# Patient Record
Sex: Male | Born: 1975 | Race: White | Hispanic: No | Marital: Single | Smoking: Never smoker
Health system: Southern US, Community
[De-identification: ages and names within clinical notes are randomized; demographics above are authoritative.]

## PROBLEM LIST (undated history)

## (undated) DIAGNOSIS — G40909 Epilepsy, unspecified, not intractable, without status epilepticus: Secondary | ICD-10-CM

## (undated) DIAGNOSIS — Z5189 Encounter for other specified aftercare: Secondary | ICD-10-CM

## (undated) DIAGNOSIS — K5904 Chronic idiopathic constipation: Secondary | ICD-10-CM

## (undated) DIAGNOSIS — T148XXA Other injury of unspecified body region, initial encounter: Secondary | ICD-10-CM

## (undated) DIAGNOSIS — T7840XA Allergy, unspecified, initial encounter: Secondary | ICD-10-CM

## (undated) DIAGNOSIS — G808 Other cerebral palsy: Secondary | ICD-10-CM

## (undated) DIAGNOSIS — G709 Myoneural disorder, unspecified: Secondary | ICD-10-CM

## (undated) HISTORY — DX: Myoneural disorder, unspecified: G70.9

## (undated) HISTORY — PX: BOWEL RESECTION: SHX1257

## (undated) HISTORY — PX: ADENOIDECTOMY: SUR15

## (undated) HISTORY — DX: Other cerebral palsy: G80.8

## (undated) HISTORY — DX: Allergy, unspecified, initial encounter: T78.40XA

## (undated) HISTORY — DX: Other injury of unspecified body region, initial encounter: T14.8XXA

## (undated) HISTORY — PX: CHOLECYSTECTOMY: SHX55

## (undated) HISTORY — DX: Chronic idiopathic constipation: K59.04

## (undated) HISTORY — PX: OTHER SURGICAL HISTORY: SHX169

## (undated) HISTORY — DX: Encounter for other specified aftercare: Z51.89

## (undated) HISTORY — PX: TONSILLECTOMY: SUR1361

## (undated) HISTORY — DX: Epilepsy, unspecified, not intractable, without status epilepticus: G40.909

---

## 1998-12-12 ENCOUNTER — Ambulatory Visit (HOSPITAL_COMMUNITY): Admission: RE | Admit: 1998-12-12 | Discharge: 1998-12-12 | Payer: Self-pay | Admitting: Family Medicine

## 1998-12-12 ENCOUNTER — Encounter: Payer: Self-pay | Admitting: Family Medicine

## 2012-03-06 DIAGNOSIS — M542 Cervicalgia: Secondary | ICD-10-CM | POA: Diagnosis not present

## 2012-03-06 DIAGNOSIS — H6120 Impacted cerumen, unspecified ear: Secondary | ICD-10-CM | POA: Diagnosis not present

## 2012-07-18 DIAGNOSIS — R51 Headache: Secondary | ICD-10-CM | POA: Diagnosis not present

## 2012-07-18 DIAGNOSIS — G809 Cerebral palsy, unspecified: Secondary | ICD-10-CM | POA: Diagnosis not present

## 2012-07-18 DIAGNOSIS — R220 Localized swelling, mass and lump, head: Secondary | ICD-10-CM | POA: Diagnosis not present

## 2012-07-18 DIAGNOSIS — R221 Localized swelling, mass and lump, neck: Secondary | ICD-10-CM | POA: Diagnosis not present

## 2012-12-10 DIAGNOSIS — L210 Seborrhea capitis: Secondary | ICD-10-CM | POA: Diagnosis not present

## 2012-12-10 DIAGNOSIS — B360 Pityriasis versicolor: Secondary | ICD-10-CM | POA: Diagnosis not present

## 2012-12-10 DIAGNOSIS — L259 Unspecified contact dermatitis, unspecified cause: Secondary | ICD-10-CM | POA: Diagnosis not present

## 2013-09-25 DIAGNOSIS — Z23 Encounter for immunization: Secondary | ICD-10-CM | POA: Diagnosis not present

## 2013-11-27 DIAGNOSIS — J0100 Acute maxillary sinusitis, unspecified: Secondary | ICD-10-CM | POA: Diagnosis not present

## 2013-11-27 DIAGNOSIS — L218 Other seborrheic dermatitis: Secondary | ICD-10-CM | POA: Diagnosis not present

## 2013-11-27 DIAGNOSIS — G8190 Hemiplegia, unspecified affecting unspecified side: Secondary | ICD-10-CM | POA: Diagnosis not present

## 2013-11-27 DIAGNOSIS — G809 Cerebral palsy, unspecified: Secondary | ICD-10-CM | POA: Diagnosis not present

## 2014-02-10 DIAGNOSIS — J018 Other acute sinusitis: Secondary | ICD-10-CM | POA: Diagnosis not present

## 2014-02-10 DIAGNOSIS — H9209 Otalgia, unspecified ear: Secondary | ICD-10-CM | POA: Diagnosis not present

## 2014-02-10 DIAGNOSIS — R6889 Other general symptoms and signs: Secondary | ICD-10-CM | POA: Diagnosis not present

## 2014-02-10 DIAGNOSIS — H6980 Other specified disorders of Eustachian tube, unspecified ear: Secondary | ICD-10-CM | POA: Diagnosis not present

## 2014-03-09 DIAGNOSIS — Z79899 Other long term (current) drug therapy: Secondary | ICD-10-CM | POA: Diagnosis not present

## 2014-03-09 DIAGNOSIS — G809 Cerebral palsy, unspecified: Secondary | ICD-10-CM | POA: Diagnosis not present

## 2014-03-09 DIAGNOSIS — R51 Headache: Secondary | ICD-10-CM | POA: Diagnosis not present

## 2015-03-11 DIAGNOSIS — G809 Cerebral palsy, unspecified: Secondary | ICD-10-CM | POA: Diagnosis not present

## 2015-03-11 DIAGNOSIS — M6281 Muscle weakness (generalized): Secondary | ICD-10-CM | POA: Diagnosis not present

## 2015-03-11 DIAGNOSIS — G8250 Quadriplegia, unspecified: Secondary | ICD-10-CM | POA: Insufficient documentation

## 2015-03-11 DIAGNOSIS — R2681 Unsteadiness on feet: Secondary | ICD-10-CM | POA: Diagnosis not present

## 2015-03-11 DIAGNOSIS — R269 Unspecified abnormalities of gait and mobility: Secondary | ICD-10-CM | POA: Diagnosis not present

## 2015-03-11 DIAGNOSIS — M62838 Other muscle spasm: Secondary | ICD-10-CM | POA: Diagnosis not present

## 2015-03-11 DIAGNOSIS — R51 Headache: Secondary | ICD-10-CM | POA: Diagnosis not present

## 2016-09-27 DIAGNOSIS — H6122 Impacted cerumen, left ear: Secondary | ICD-10-CM | POA: Diagnosis not present

## 2016-09-27 DIAGNOSIS — Z23 Encounter for immunization: Secondary | ICD-10-CM | POA: Diagnosis not present

## 2016-09-27 DIAGNOSIS — K0500 Acute gingivitis, plaque induced: Secondary | ICD-10-CM | POA: Diagnosis not present

## 2016-09-27 DIAGNOSIS — L218 Other seborrheic dermatitis: Secondary | ICD-10-CM | POA: Diagnosis not present

## 2016-09-27 DIAGNOSIS — G802 Spastic hemiplegic cerebral palsy: Secondary | ICD-10-CM | POA: Diagnosis not present

## 2016-11-14 DIAGNOSIS — G800 Spastic quadriplegic cerebral palsy: Secondary | ICD-10-CM | POA: Diagnosis not present

## 2016-11-14 DIAGNOSIS — M6281 Muscle weakness (generalized): Secondary | ICD-10-CM | POA: Diagnosis not present

## 2016-11-14 DIAGNOSIS — Z993 Dependence on wheelchair: Secondary | ICD-10-CM | POA: Diagnosis not present

## 2016-12-17 DIAGNOSIS — H5202 Hypermetropia, left eye: Secondary | ICD-10-CM | POA: Diagnosis not present

## 2016-12-17 DIAGNOSIS — R6250 Unspecified lack of expected normal physiological development in childhood: Secondary | ICD-10-CM | POA: Insufficient documentation

## 2016-12-17 DIAGNOSIS — H52201 Unspecified astigmatism, right eye: Secondary | ICD-10-CM | POA: Diagnosis not present

## 2016-12-17 DIAGNOSIS — H5231 Anisometropia: Secondary | ICD-10-CM | POA: Diagnosis not present

## 2016-12-17 DIAGNOSIS — G8250 Quadriplegia, unspecified: Secondary | ICD-10-CM | POA: Diagnosis not present

## 2016-12-17 DIAGNOSIS — H5211 Myopia, right eye: Secondary | ICD-10-CM | POA: Diagnosis not present

## 2017-02-01 DIAGNOSIS — R269 Unspecified abnormalities of gait and mobility: Secondary | ICD-10-CM | POA: Insufficient documentation

## 2017-02-01 DIAGNOSIS — G808 Other cerebral palsy: Secondary | ICD-10-CM | POA: Diagnosis not present

## 2017-02-01 DIAGNOSIS — G8250 Quadriplegia, unspecified: Secondary | ICD-10-CM | POA: Diagnosis not present

## 2017-02-01 DIAGNOSIS — R471 Dysarthria and anarthria: Secondary | ICD-10-CM | POA: Diagnosis not present

## 2017-05-10 DIAGNOSIS — R531 Weakness: Secondary | ICD-10-CM | POA: Diagnosis not present

## 2017-05-10 DIAGNOSIS — E222 Syndrome of inappropriate secretion of antidiuretic hormone: Secondary | ICD-10-CM | POA: Diagnosis not present

## 2017-05-10 DIAGNOSIS — E860 Dehydration: Secondary | ICD-10-CM | POA: Diagnosis not present

## 2017-05-10 DIAGNOSIS — N10 Acute tubulo-interstitial nephritis: Secondary | ICD-10-CM | POA: Diagnosis not present

## 2017-05-10 DIAGNOSIS — B965 Pseudomonas (aeruginosa) (mallei) (pseudomallei) as the cause of diseases classified elsewhere: Secondary | ICD-10-CM | POA: Diagnosis not present

## 2017-05-10 DIAGNOSIS — R509 Fever, unspecified: Secondary | ICD-10-CM | POA: Diagnosis not present

## 2017-05-10 DIAGNOSIS — A419 Sepsis, unspecified organism: Secondary | ICD-10-CM | POA: Diagnosis not present

## 2017-05-10 DIAGNOSIS — Z982 Presence of cerebrospinal fluid drainage device: Secondary | ICD-10-CM | POA: Diagnosis not present

## 2017-05-10 DIAGNOSIS — G800 Spastic quadriplegic cerebral palsy: Secondary | ICD-10-CM | POA: Diagnosis not present

## 2017-05-10 DIAGNOSIS — N390 Urinary tract infection, site not specified: Secondary | ICD-10-CM | POA: Diagnosis not present

## 2017-05-10 DIAGNOSIS — R000 Tachycardia, unspecified: Secondary | ICD-10-CM | POA: Diagnosis not present

## 2017-05-11 DIAGNOSIS — K3189 Other diseases of stomach and duodenum: Secondary | ICD-10-CM | POA: Diagnosis not present

## 2017-05-11 DIAGNOSIS — N10 Acute tubulo-interstitial nephritis: Secondary | ICD-10-CM | POA: Diagnosis present

## 2017-05-11 DIAGNOSIS — R531 Weakness: Secondary | ICD-10-CM | POA: Diagnosis not present

## 2017-05-11 DIAGNOSIS — E860 Dehydration: Secondary | ICD-10-CM | POA: Diagnosis present

## 2017-05-11 DIAGNOSIS — G4709 Other insomnia: Secondary | ICD-10-CM | POA: Diagnosis present

## 2017-05-11 DIAGNOSIS — E871 Hypo-osmolality and hyponatremia: Secondary | ICD-10-CM | POA: Diagnosis not present

## 2017-05-11 DIAGNOSIS — N390 Urinary tract infection, site not specified: Secondary | ICD-10-CM | POA: Diagnosis not present

## 2017-05-11 DIAGNOSIS — K5641 Fecal impaction: Secondary | ICD-10-CM | POA: Diagnosis present

## 2017-05-11 DIAGNOSIS — A419 Sepsis, unspecified organism: Secondary | ICD-10-CM | POA: Diagnosis present

## 2017-05-11 DIAGNOSIS — D649 Anemia, unspecified: Secondary | ICD-10-CM | POA: Diagnosis present

## 2017-05-11 DIAGNOSIS — Z982 Presence of cerebrospinal fluid drainage device: Secondary | ICD-10-CM | POA: Diagnosis not present

## 2017-05-11 DIAGNOSIS — E876 Hypokalemia: Secondary | ICD-10-CM | POA: Diagnosis not present

## 2017-05-11 DIAGNOSIS — R000 Tachycardia, unspecified: Secondary | ICD-10-CM | POA: Diagnosis not present

## 2017-05-11 DIAGNOSIS — Z993 Dependence on wheelchair: Secondary | ICD-10-CM | POA: Diagnosis not present

## 2017-05-11 DIAGNOSIS — G808 Other cerebral palsy: Secondary | ICD-10-CM | POA: Diagnosis not present

## 2017-05-11 DIAGNOSIS — B965 Pseudomonas (aeruginosa) (mallei) (pseudomallei) as the cause of diseases classified elsewhere: Secondary | ICD-10-CM | POA: Diagnosis present

## 2017-05-11 DIAGNOSIS — R791 Abnormal coagulation profile: Secondary | ICD-10-CM | POA: Diagnosis present

## 2017-05-11 DIAGNOSIS — E222 Syndrome of inappropriate secretion of antidiuretic hormone: Secondary | ICD-10-CM | POA: Diagnosis present

## 2017-05-11 DIAGNOSIS — R509 Fever, unspecified: Secondary | ICD-10-CM | POA: Diagnosis not present

## 2017-05-11 DIAGNOSIS — N3000 Acute cystitis without hematuria: Secondary | ICD-10-CM | POA: Diagnosis not present

## 2017-05-11 DIAGNOSIS — Z9189 Other specified personal risk factors, not elsewhere classified: Secondary | ICD-10-CM | POA: Diagnosis not present

## 2017-05-11 DIAGNOSIS — R6250 Unspecified lack of expected normal physiological development in childhood: Secondary | ICD-10-CM | POA: Diagnosis present

## 2017-05-11 DIAGNOSIS — G800 Spastic quadriplegic cerebral palsy: Secondary | ICD-10-CM | POA: Diagnosis present

## 2017-05-21 DIAGNOSIS — A419 Sepsis, unspecified organism: Secondary | ICD-10-CM | POA: Diagnosis not present

## 2017-05-21 DIAGNOSIS — N10 Acute tubulo-interstitial nephritis: Secondary | ICD-10-CM | POA: Diagnosis not present

## 2017-05-21 DIAGNOSIS — A4152 Sepsis due to Pseudomonas: Secondary | ICD-10-CM | POA: Diagnosis not present

## 2017-05-21 DIAGNOSIS — E871 Hypo-osmolality and hyponatremia: Secondary | ICD-10-CM | POA: Diagnosis not present

## 2017-05-21 DIAGNOSIS — G800 Spastic quadriplegic cerebral palsy: Secondary | ICD-10-CM | POA: Diagnosis not present

## 2017-05-27 DIAGNOSIS — G800 Spastic quadriplegic cerebral palsy: Secondary | ICD-10-CM | POA: Diagnosis not present

## 2017-05-27 DIAGNOSIS — G40909 Epilepsy, unspecified, not intractable, without status epilepticus: Secondary | ICD-10-CM | POA: Diagnosis not present

## 2017-05-27 DIAGNOSIS — Z982 Presence of cerebrospinal fluid drainage device: Secondary | ICD-10-CM | POA: Diagnosis not present

## 2017-05-27 DIAGNOSIS — M24521 Contracture, right elbow: Secondary | ICD-10-CM | POA: Diagnosis not present

## 2017-05-27 DIAGNOSIS — Z87440 Personal history of urinary (tract) infections: Secondary | ICD-10-CM | POA: Diagnosis not present

## 2017-05-27 DIAGNOSIS — M6281 Muscle weakness (generalized): Secondary | ICD-10-CM | POA: Diagnosis not present

## 2017-05-30 DIAGNOSIS — Z982 Presence of cerebrospinal fluid drainage device: Secondary | ICD-10-CM | POA: Diagnosis not present

## 2017-05-30 DIAGNOSIS — G40909 Epilepsy, unspecified, not intractable, without status epilepticus: Secondary | ICD-10-CM | POA: Diagnosis not present

## 2017-05-30 DIAGNOSIS — G800 Spastic quadriplegic cerebral palsy: Secondary | ICD-10-CM | POA: Diagnosis not present

## 2017-05-30 DIAGNOSIS — M6281 Muscle weakness (generalized): Secondary | ICD-10-CM | POA: Diagnosis not present

## 2017-05-30 DIAGNOSIS — Z87440 Personal history of urinary (tract) infections: Secondary | ICD-10-CM | POA: Diagnosis not present

## 2017-05-30 DIAGNOSIS — M24521 Contracture, right elbow: Secondary | ICD-10-CM | POA: Diagnosis not present

## 2017-06-04 DIAGNOSIS — Z87440 Personal history of urinary (tract) infections: Secondary | ICD-10-CM | POA: Diagnosis not present

## 2017-06-04 DIAGNOSIS — M6281 Muscle weakness (generalized): Secondary | ICD-10-CM | POA: Diagnosis not present

## 2017-06-04 DIAGNOSIS — G800 Spastic quadriplegic cerebral palsy: Secondary | ICD-10-CM | POA: Diagnosis not present

## 2017-06-04 DIAGNOSIS — G40909 Epilepsy, unspecified, not intractable, without status epilepticus: Secondary | ICD-10-CM | POA: Diagnosis not present

## 2017-06-04 DIAGNOSIS — M24521 Contracture, right elbow: Secondary | ICD-10-CM | POA: Diagnosis not present

## 2017-06-04 DIAGNOSIS — Z982 Presence of cerebrospinal fluid drainage device: Secondary | ICD-10-CM | POA: Diagnosis not present

## 2017-06-04 DIAGNOSIS — D649 Anemia, unspecified: Secondary | ICD-10-CM | POA: Diagnosis not present

## 2017-06-05 DIAGNOSIS — Z23 Encounter for immunization: Secondary | ICD-10-CM | POA: Diagnosis not present

## 2017-06-05 DIAGNOSIS — R358 Other polyuria: Secondary | ICD-10-CM | POA: Diagnosis not present

## 2017-06-05 DIAGNOSIS — D649 Anemia, unspecified: Secondary | ICD-10-CM | POA: Diagnosis not present

## 2017-06-05 DIAGNOSIS — D538 Other specified nutritional anemias: Secondary | ICD-10-CM | POA: Diagnosis not present

## 2017-06-05 DIAGNOSIS — G800 Spastic quadriplegic cerebral palsy: Secondary | ICD-10-CM | POA: Diagnosis not present

## 2017-06-06 DIAGNOSIS — G800 Spastic quadriplegic cerebral palsy: Secondary | ICD-10-CM | POA: Diagnosis not present

## 2017-06-06 DIAGNOSIS — Z982 Presence of cerebrospinal fluid drainage device: Secondary | ICD-10-CM | POA: Diagnosis not present

## 2017-06-06 DIAGNOSIS — M6281 Muscle weakness (generalized): Secondary | ICD-10-CM | POA: Diagnosis not present

## 2017-06-06 DIAGNOSIS — Z87440 Personal history of urinary (tract) infections: Secondary | ICD-10-CM | POA: Diagnosis not present

## 2017-06-06 DIAGNOSIS — M24521 Contracture, right elbow: Secondary | ICD-10-CM | POA: Diagnosis not present

## 2017-06-06 DIAGNOSIS — G40909 Epilepsy, unspecified, not intractable, without status epilepticus: Secondary | ICD-10-CM | POA: Diagnosis not present

## 2017-06-10 DIAGNOSIS — G800 Spastic quadriplegic cerebral palsy: Secondary | ICD-10-CM | POA: Diagnosis not present

## 2017-06-10 DIAGNOSIS — G40909 Epilepsy, unspecified, not intractable, without status epilepticus: Secondary | ICD-10-CM | POA: Diagnosis not present

## 2017-06-10 DIAGNOSIS — M24521 Contracture, right elbow: Secondary | ICD-10-CM | POA: Diagnosis not present

## 2017-06-10 DIAGNOSIS — Z982 Presence of cerebrospinal fluid drainage device: Secondary | ICD-10-CM | POA: Diagnosis not present

## 2017-06-10 DIAGNOSIS — M6281 Muscle weakness (generalized): Secondary | ICD-10-CM | POA: Diagnosis not present

## 2017-06-10 DIAGNOSIS — Z87440 Personal history of urinary (tract) infections: Secondary | ICD-10-CM | POA: Diagnosis not present

## 2017-06-11 DIAGNOSIS — G800 Spastic quadriplegic cerebral palsy: Secondary | ICD-10-CM | POA: Diagnosis not present

## 2017-06-11 DIAGNOSIS — M6281 Muscle weakness (generalized): Secondary | ICD-10-CM | POA: Diagnosis not present

## 2017-06-11 DIAGNOSIS — Z87440 Personal history of urinary (tract) infections: Secondary | ICD-10-CM | POA: Diagnosis not present

## 2017-06-11 DIAGNOSIS — G40909 Epilepsy, unspecified, not intractable, without status epilepticus: Secondary | ICD-10-CM | POA: Diagnosis not present

## 2017-06-11 DIAGNOSIS — M24521 Contracture, right elbow: Secondary | ICD-10-CM | POA: Diagnosis not present

## 2017-06-11 DIAGNOSIS — Z982 Presence of cerebrospinal fluid drainage device: Secondary | ICD-10-CM | POA: Diagnosis not present

## 2017-06-12 DIAGNOSIS — M24521 Contracture, right elbow: Secondary | ICD-10-CM | POA: Diagnosis not present

## 2017-06-12 DIAGNOSIS — Z87440 Personal history of urinary (tract) infections: Secondary | ICD-10-CM | POA: Diagnosis not present

## 2017-06-12 DIAGNOSIS — G40909 Epilepsy, unspecified, not intractable, without status epilepticus: Secondary | ICD-10-CM | POA: Diagnosis not present

## 2017-06-12 DIAGNOSIS — M6281 Muscle weakness (generalized): Secondary | ICD-10-CM | POA: Diagnosis not present

## 2017-06-12 DIAGNOSIS — G800 Spastic quadriplegic cerebral palsy: Secondary | ICD-10-CM | POA: Diagnosis not present

## 2017-06-12 DIAGNOSIS — Z982 Presence of cerebrospinal fluid drainage device: Secondary | ICD-10-CM | POA: Diagnosis not present

## 2017-06-13 DIAGNOSIS — Z87440 Personal history of urinary (tract) infections: Secondary | ICD-10-CM | POA: Diagnosis not present

## 2017-06-13 DIAGNOSIS — M24521 Contracture, right elbow: Secondary | ICD-10-CM | POA: Diagnosis not present

## 2017-06-13 DIAGNOSIS — M6281 Muscle weakness (generalized): Secondary | ICD-10-CM | POA: Diagnosis not present

## 2017-06-13 DIAGNOSIS — G40909 Epilepsy, unspecified, not intractable, without status epilepticus: Secondary | ICD-10-CM | POA: Diagnosis not present

## 2017-06-13 DIAGNOSIS — G800 Spastic quadriplegic cerebral palsy: Secondary | ICD-10-CM | POA: Diagnosis not present

## 2017-06-13 DIAGNOSIS — Z982 Presence of cerebrospinal fluid drainage device: Secondary | ICD-10-CM | POA: Diagnosis not present

## 2017-06-17 DIAGNOSIS — M24521 Contracture, right elbow: Secondary | ICD-10-CM | POA: Diagnosis not present

## 2017-06-17 DIAGNOSIS — G40909 Epilepsy, unspecified, not intractable, without status epilepticus: Secondary | ICD-10-CM | POA: Diagnosis not present

## 2017-06-17 DIAGNOSIS — Z982 Presence of cerebrospinal fluid drainage device: Secondary | ICD-10-CM | POA: Diagnosis not present

## 2017-06-17 DIAGNOSIS — Z87440 Personal history of urinary (tract) infections: Secondary | ICD-10-CM | POA: Diagnosis not present

## 2017-06-17 DIAGNOSIS — G800 Spastic quadriplegic cerebral palsy: Secondary | ICD-10-CM | POA: Diagnosis not present

## 2017-06-17 DIAGNOSIS — M6281 Muscle weakness (generalized): Secondary | ICD-10-CM | POA: Diagnosis not present

## 2017-06-19 DIAGNOSIS — Z982 Presence of cerebrospinal fluid drainage device: Secondary | ICD-10-CM | POA: Diagnosis not present

## 2017-06-19 DIAGNOSIS — G800 Spastic quadriplegic cerebral palsy: Secondary | ICD-10-CM | POA: Diagnosis not present

## 2017-06-19 DIAGNOSIS — Z87440 Personal history of urinary (tract) infections: Secondary | ICD-10-CM | POA: Diagnosis not present

## 2017-06-19 DIAGNOSIS — G40909 Epilepsy, unspecified, not intractable, without status epilepticus: Secondary | ICD-10-CM | POA: Diagnosis not present

## 2017-06-19 DIAGNOSIS — M6281 Muscle weakness (generalized): Secondary | ICD-10-CM | POA: Diagnosis not present

## 2017-06-19 DIAGNOSIS — M24521 Contracture, right elbow: Secondary | ICD-10-CM | POA: Diagnosis not present

## 2017-06-24 DIAGNOSIS — G40909 Epilepsy, unspecified, not intractable, without status epilepticus: Secondary | ICD-10-CM | POA: Diagnosis not present

## 2017-06-24 DIAGNOSIS — M24521 Contracture, right elbow: Secondary | ICD-10-CM | POA: Diagnosis not present

## 2017-06-24 DIAGNOSIS — Z982 Presence of cerebrospinal fluid drainage device: Secondary | ICD-10-CM | POA: Diagnosis not present

## 2017-06-24 DIAGNOSIS — Z87440 Personal history of urinary (tract) infections: Secondary | ICD-10-CM | POA: Diagnosis not present

## 2017-06-24 DIAGNOSIS — M6281 Muscle weakness (generalized): Secondary | ICD-10-CM | POA: Diagnosis not present

## 2017-06-24 DIAGNOSIS — G800 Spastic quadriplegic cerebral palsy: Secondary | ICD-10-CM | POA: Diagnosis not present

## 2017-06-25 DIAGNOSIS — G40909 Epilepsy, unspecified, not intractable, without status epilepticus: Secondary | ICD-10-CM | POA: Diagnosis not present

## 2017-06-25 DIAGNOSIS — G800 Spastic quadriplegic cerebral palsy: Secondary | ICD-10-CM | POA: Diagnosis not present

## 2017-06-25 DIAGNOSIS — M24521 Contracture, right elbow: Secondary | ICD-10-CM | POA: Diagnosis not present

## 2017-06-25 DIAGNOSIS — Z982 Presence of cerebrospinal fluid drainage device: Secondary | ICD-10-CM | POA: Diagnosis not present

## 2017-06-25 DIAGNOSIS — M6281 Muscle weakness (generalized): Secondary | ICD-10-CM | POA: Diagnosis not present

## 2017-06-25 DIAGNOSIS — Z87440 Personal history of urinary (tract) infections: Secondary | ICD-10-CM | POA: Diagnosis not present

## 2017-06-28 DIAGNOSIS — Z87440 Personal history of urinary (tract) infections: Secondary | ICD-10-CM | POA: Diagnosis not present

## 2017-06-28 DIAGNOSIS — G800 Spastic quadriplegic cerebral palsy: Secondary | ICD-10-CM | POA: Diagnosis not present

## 2017-06-28 DIAGNOSIS — M24521 Contracture, right elbow: Secondary | ICD-10-CM | POA: Diagnosis not present

## 2017-06-28 DIAGNOSIS — Z982 Presence of cerebrospinal fluid drainage device: Secondary | ICD-10-CM | POA: Diagnosis not present

## 2017-06-28 DIAGNOSIS — G40909 Epilepsy, unspecified, not intractable, without status epilepticus: Secondary | ICD-10-CM | POA: Diagnosis not present

## 2017-06-28 DIAGNOSIS — M6281 Muscle weakness (generalized): Secondary | ICD-10-CM | POA: Diagnosis not present

## 2017-07-02 DIAGNOSIS — M6281 Muscle weakness (generalized): Secondary | ICD-10-CM | POA: Diagnosis not present

## 2017-07-02 DIAGNOSIS — Z87440 Personal history of urinary (tract) infections: Secondary | ICD-10-CM | POA: Diagnosis not present

## 2017-07-02 DIAGNOSIS — M24521 Contracture, right elbow: Secondary | ICD-10-CM | POA: Diagnosis not present

## 2017-07-02 DIAGNOSIS — Z982 Presence of cerebrospinal fluid drainage device: Secondary | ICD-10-CM | POA: Diagnosis not present

## 2017-07-02 DIAGNOSIS — G800 Spastic quadriplegic cerebral palsy: Secondary | ICD-10-CM | POA: Diagnosis not present

## 2017-07-02 DIAGNOSIS — G40909 Epilepsy, unspecified, not intractable, without status epilepticus: Secondary | ICD-10-CM | POA: Diagnosis not present

## 2017-07-05 DIAGNOSIS — R358 Other polyuria: Secondary | ICD-10-CM | POA: Diagnosis not present

## 2017-07-05 DIAGNOSIS — G800 Spastic quadriplegic cerebral palsy: Secondary | ICD-10-CM | POA: Diagnosis not present

## 2017-07-09 DIAGNOSIS — Z982 Presence of cerebrospinal fluid drainage device: Secondary | ICD-10-CM | POA: Diagnosis not present

## 2017-07-09 DIAGNOSIS — M6281 Muscle weakness (generalized): Secondary | ICD-10-CM | POA: Diagnosis not present

## 2017-07-09 DIAGNOSIS — Z87440 Personal history of urinary (tract) infections: Secondary | ICD-10-CM | POA: Diagnosis not present

## 2017-07-09 DIAGNOSIS — G40909 Epilepsy, unspecified, not intractable, without status epilepticus: Secondary | ICD-10-CM | POA: Diagnosis not present

## 2017-07-09 DIAGNOSIS — G800 Spastic quadriplegic cerebral palsy: Secondary | ICD-10-CM | POA: Diagnosis not present

## 2017-07-09 DIAGNOSIS — M24521 Contracture, right elbow: Secondary | ICD-10-CM | POA: Diagnosis not present

## 2017-11-12 DIAGNOSIS — R471 Dysarthria and anarthria: Secondary | ICD-10-CM | POA: Diagnosis not present

## 2017-11-12 DIAGNOSIS — G808 Other cerebral palsy: Secondary | ICD-10-CM | POA: Diagnosis not present

## 2017-11-12 DIAGNOSIS — R269 Unspecified abnormalities of gait and mobility: Secondary | ICD-10-CM | POA: Diagnosis not present

## 2017-11-12 DIAGNOSIS — G8250 Quadriplegia, unspecified: Secondary | ICD-10-CM | POA: Diagnosis not present

## 2017-11-12 DIAGNOSIS — Z741 Need for assistance with personal care: Secondary | ICD-10-CM | POA: Diagnosis not present

## 2017-12-05 DIAGNOSIS — Z9181 History of falling: Secondary | ICD-10-CM | POA: Diagnosis not present

## 2017-12-05 DIAGNOSIS — G800 Spastic quadriplegic cerebral palsy: Secondary | ICD-10-CM | POA: Diagnosis not present

## 2017-12-05 DIAGNOSIS — Z993 Dependence on wheelchair: Secondary | ICD-10-CM | POA: Diagnosis not present

## 2017-12-05 DIAGNOSIS — G919 Hydrocephalus, unspecified: Secondary | ICD-10-CM | POA: Diagnosis not present

## 2017-12-17 DIAGNOSIS — Z9181 History of falling: Secondary | ICD-10-CM | POA: Diagnosis not present

## 2017-12-17 DIAGNOSIS — G919 Hydrocephalus, unspecified: Secondary | ICD-10-CM | POA: Diagnosis not present

## 2017-12-17 DIAGNOSIS — Z993 Dependence on wheelchair: Secondary | ICD-10-CM | POA: Diagnosis not present

## 2017-12-17 DIAGNOSIS — G800 Spastic quadriplegic cerebral palsy: Secondary | ICD-10-CM | POA: Diagnosis not present

## 2017-12-18 DIAGNOSIS — Z993 Dependence on wheelchair: Secondary | ICD-10-CM | POA: Diagnosis not present

## 2017-12-18 DIAGNOSIS — G919 Hydrocephalus, unspecified: Secondary | ICD-10-CM | POA: Diagnosis not present

## 2017-12-18 DIAGNOSIS — G800 Spastic quadriplegic cerebral palsy: Secondary | ICD-10-CM | POA: Diagnosis not present

## 2017-12-18 DIAGNOSIS — Z9181 History of falling: Secondary | ICD-10-CM | POA: Diagnosis not present

## 2017-12-26 DIAGNOSIS — G919 Hydrocephalus, unspecified: Secondary | ICD-10-CM | POA: Diagnosis not present

## 2017-12-26 DIAGNOSIS — Z993 Dependence on wheelchair: Secondary | ICD-10-CM | POA: Diagnosis not present

## 2017-12-26 DIAGNOSIS — Z9181 History of falling: Secondary | ICD-10-CM | POA: Diagnosis not present

## 2017-12-26 DIAGNOSIS — G800 Spastic quadriplegic cerebral palsy: Secondary | ICD-10-CM | POA: Diagnosis not present

## 2017-12-31 DIAGNOSIS — Z993 Dependence on wheelchair: Secondary | ICD-10-CM | POA: Diagnosis not present

## 2017-12-31 DIAGNOSIS — G919 Hydrocephalus, unspecified: Secondary | ICD-10-CM | POA: Diagnosis not present

## 2017-12-31 DIAGNOSIS — G800 Spastic quadriplegic cerebral palsy: Secondary | ICD-10-CM | POA: Diagnosis not present

## 2017-12-31 DIAGNOSIS — Z9181 History of falling: Secondary | ICD-10-CM | POA: Diagnosis not present

## 2018-01-01 DIAGNOSIS — E441 Mild protein-calorie malnutrition: Secondary | ICD-10-CM | POA: Diagnosis not present

## 2018-01-01 DIAGNOSIS — Z13220 Encounter for screening for lipoid disorders: Secondary | ICD-10-CM | POA: Diagnosis not present

## 2018-01-01 DIAGNOSIS — G800 Spastic quadriplegic cerebral palsy: Secondary | ICD-10-CM | POA: Diagnosis not present

## 2018-01-01 DIAGNOSIS — Z23 Encounter for immunization: Secondary | ICD-10-CM | POA: Diagnosis not present

## 2018-01-01 DIAGNOSIS — Z0001 Encounter for general adult medical examination with abnormal findings: Secondary | ICD-10-CM | POA: Diagnosis not present

## 2018-01-03 DIAGNOSIS — G919 Hydrocephalus, unspecified: Secondary | ICD-10-CM | POA: Diagnosis not present

## 2018-01-03 DIAGNOSIS — Z993 Dependence on wheelchair: Secondary | ICD-10-CM | POA: Diagnosis not present

## 2018-01-03 DIAGNOSIS — Z9181 History of falling: Secondary | ICD-10-CM | POA: Diagnosis not present

## 2018-01-03 DIAGNOSIS — G800 Spastic quadriplegic cerebral palsy: Secondary | ICD-10-CM | POA: Diagnosis not present

## 2018-01-07 DIAGNOSIS — Z993 Dependence on wheelchair: Secondary | ICD-10-CM | POA: Diagnosis not present

## 2018-01-07 DIAGNOSIS — Z9181 History of falling: Secondary | ICD-10-CM | POA: Diagnosis not present

## 2018-01-07 DIAGNOSIS — G919 Hydrocephalus, unspecified: Secondary | ICD-10-CM | POA: Diagnosis not present

## 2018-01-07 DIAGNOSIS — G800 Spastic quadriplegic cerebral palsy: Secondary | ICD-10-CM | POA: Diagnosis not present

## 2018-01-08 DIAGNOSIS — Z9181 History of falling: Secondary | ICD-10-CM | POA: Diagnosis not present

## 2018-01-08 DIAGNOSIS — G919 Hydrocephalus, unspecified: Secondary | ICD-10-CM | POA: Diagnosis not present

## 2018-01-08 DIAGNOSIS — Z993 Dependence on wheelchair: Secondary | ICD-10-CM | POA: Diagnosis not present

## 2018-01-08 DIAGNOSIS — G800 Spastic quadriplegic cerebral palsy: Secondary | ICD-10-CM | POA: Diagnosis not present

## 2018-01-09 DIAGNOSIS — Z9181 History of falling: Secondary | ICD-10-CM | POA: Diagnosis not present

## 2018-01-09 DIAGNOSIS — G800 Spastic quadriplegic cerebral palsy: Secondary | ICD-10-CM | POA: Diagnosis not present

## 2018-01-09 DIAGNOSIS — Z993 Dependence on wheelchair: Secondary | ICD-10-CM | POA: Diagnosis not present

## 2018-01-09 DIAGNOSIS — G919 Hydrocephalus, unspecified: Secondary | ICD-10-CM | POA: Diagnosis not present

## 2018-01-14 DIAGNOSIS — G800 Spastic quadriplegic cerebral palsy: Secondary | ICD-10-CM | POA: Diagnosis not present

## 2018-01-14 DIAGNOSIS — Z993 Dependence on wheelchair: Secondary | ICD-10-CM | POA: Diagnosis not present

## 2018-01-14 DIAGNOSIS — Z9181 History of falling: Secondary | ICD-10-CM | POA: Diagnosis not present

## 2018-01-14 DIAGNOSIS — G919 Hydrocephalus, unspecified: Secondary | ICD-10-CM | POA: Diagnosis not present

## 2018-01-15 DIAGNOSIS — H5789 Other specified disorders of eye and adnexa: Secondary | ICD-10-CM | POA: Diagnosis not present

## 2018-01-15 DIAGNOSIS — G8250 Quadriplegia, unspecified: Secondary | ICD-10-CM | POA: Diagnosis not present

## 2018-01-15 DIAGNOSIS — G809 Cerebral palsy, unspecified: Secondary | ICD-10-CM | POA: Diagnosis not present

## 2018-01-15 DIAGNOSIS — H5231 Anisometropia: Secondary | ICD-10-CM | POA: Diagnosis not present

## 2018-01-16 DIAGNOSIS — G800 Spastic quadriplegic cerebral palsy: Secondary | ICD-10-CM | POA: Diagnosis not present

## 2018-01-16 DIAGNOSIS — Z993 Dependence on wheelchair: Secondary | ICD-10-CM | POA: Diagnosis not present

## 2018-01-16 DIAGNOSIS — Z9181 History of falling: Secondary | ICD-10-CM | POA: Diagnosis not present

## 2018-01-16 DIAGNOSIS — G919 Hydrocephalus, unspecified: Secondary | ICD-10-CM | POA: Diagnosis not present

## 2018-01-21 DIAGNOSIS — Z993 Dependence on wheelchair: Secondary | ICD-10-CM | POA: Diagnosis not present

## 2018-01-21 DIAGNOSIS — G919 Hydrocephalus, unspecified: Secondary | ICD-10-CM | POA: Diagnosis not present

## 2018-01-21 DIAGNOSIS — Z9181 History of falling: Secondary | ICD-10-CM | POA: Diagnosis not present

## 2018-01-21 DIAGNOSIS — G800 Spastic quadriplegic cerebral palsy: Secondary | ICD-10-CM | POA: Diagnosis not present

## 2018-01-23 DIAGNOSIS — G919 Hydrocephalus, unspecified: Secondary | ICD-10-CM | POA: Diagnosis not present

## 2018-01-23 DIAGNOSIS — G800 Spastic quadriplegic cerebral palsy: Secondary | ICD-10-CM | POA: Diagnosis not present

## 2018-01-23 DIAGNOSIS — Z9181 History of falling: Secondary | ICD-10-CM | POA: Diagnosis not present

## 2018-01-23 DIAGNOSIS — Z993 Dependence on wheelchair: Secondary | ICD-10-CM | POA: Diagnosis not present

## 2018-01-28 DIAGNOSIS — G919 Hydrocephalus, unspecified: Secondary | ICD-10-CM | POA: Diagnosis not present

## 2018-01-28 DIAGNOSIS — Z9181 History of falling: Secondary | ICD-10-CM | POA: Diagnosis not present

## 2018-01-28 DIAGNOSIS — Z993 Dependence on wheelchair: Secondary | ICD-10-CM | POA: Diagnosis not present

## 2018-01-28 DIAGNOSIS — G800 Spastic quadriplegic cerebral palsy: Secondary | ICD-10-CM | POA: Diagnosis not present

## 2018-03-04 DIAGNOSIS — G8250 Quadriplegia, unspecified: Secondary | ICD-10-CM | POA: Diagnosis not present

## 2018-03-04 DIAGNOSIS — G800 Spastic quadriplegic cerebral palsy: Secondary | ICD-10-CM | POA: Diagnosis not present

## 2018-07-07 DIAGNOSIS — R471 Dysarthria and anarthria: Secondary | ICD-10-CM | POA: Diagnosis not present

## 2018-07-07 DIAGNOSIS — R351 Nocturia: Secondary | ICD-10-CM | POA: Diagnosis not present

## 2018-07-07 DIAGNOSIS — G8250 Quadriplegia, unspecified: Secondary | ICD-10-CM | POA: Diagnosis not present

## 2018-07-07 DIAGNOSIS — R269 Unspecified abnormalities of gait and mobility: Secondary | ICD-10-CM | POA: Diagnosis not present

## 2018-07-14 DIAGNOSIS — L89302 Pressure ulcer of unspecified buttock, stage 2: Secondary | ICD-10-CM | POA: Diagnosis not present

## 2018-07-22 DIAGNOSIS — Z23 Encounter for immunization: Secondary | ICD-10-CM | POA: Diagnosis not present

## 2018-07-22 DIAGNOSIS — L89302 Pressure ulcer of unspecified buttock, stage 2: Secondary | ICD-10-CM | POA: Diagnosis not present

## 2018-07-23 DIAGNOSIS — E441 Mild protein-calorie malnutrition: Secondary | ICD-10-CM | POA: Diagnosis not present

## 2018-07-23 DIAGNOSIS — G800 Spastic quadriplegic cerebral palsy: Secondary | ICD-10-CM | POA: Diagnosis not present

## 2018-07-23 DIAGNOSIS — Z87440 Personal history of urinary (tract) infections: Secondary | ICD-10-CM | POA: Diagnosis not present

## 2018-07-23 DIAGNOSIS — K5904 Chronic idiopathic constipation: Secondary | ICD-10-CM | POA: Diagnosis not present

## 2018-07-23 DIAGNOSIS — L89322 Pressure ulcer of left buttock, stage 2: Secondary | ICD-10-CM | POA: Diagnosis not present

## 2018-07-23 DIAGNOSIS — M24521 Contracture, right elbow: Secondary | ICD-10-CM | POA: Diagnosis not present

## 2018-07-23 DIAGNOSIS — Z982 Presence of cerebrospinal fluid drainage device: Secondary | ICD-10-CM | POA: Diagnosis not present

## 2018-07-23 DIAGNOSIS — G40909 Epilepsy, unspecified, not intractable, without status epilepticus: Secondary | ICD-10-CM | POA: Diagnosis not present

## 2018-07-30 DIAGNOSIS — G800 Spastic quadriplegic cerebral palsy: Secondary | ICD-10-CM | POA: Diagnosis not present

## 2018-07-30 DIAGNOSIS — M24521 Contracture, right elbow: Secondary | ICD-10-CM | POA: Diagnosis not present

## 2018-07-30 DIAGNOSIS — G40909 Epilepsy, unspecified, not intractable, without status epilepticus: Secondary | ICD-10-CM | POA: Diagnosis not present

## 2018-07-30 DIAGNOSIS — L89322 Pressure ulcer of left buttock, stage 2: Secondary | ICD-10-CM | POA: Diagnosis not present

## 2018-07-31 DIAGNOSIS — K5904 Chronic idiopathic constipation: Secondary | ICD-10-CM | POA: Diagnosis not present

## 2018-07-31 DIAGNOSIS — G40909 Epilepsy, unspecified, not intractable, without status epilepticus: Secondary | ICD-10-CM | POA: Diagnosis not present

## 2018-07-31 DIAGNOSIS — G800 Spastic quadriplegic cerebral palsy: Secondary | ICD-10-CM | POA: Diagnosis not present

## 2018-07-31 DIAGNOSIS — M24521 Contracture, right elbow: Secondary | ICD-10-CM | POA: Diagnosis not present

## 2018-07-31 DIAGNOSIS — E441 Mild protein-calorie malnutrition: Secondary | ICD-10-CM | POA: Diagnosis not present

## 2018-07-31 DIAGNOSIS — L89322 Pressure ulcer of left buttock, stage 2: Secondary | ICD-10-CM | POA: Diagnosis not present

## 2018-08-05 DIAGNOSIS — M24521 Contracture, right elbow: Secondary | ICD-10-CM | POA: Diagnosis not present

## 2018-08-05 DIAGNOSIS — K5904 Chronic idiopathic constipation: Secondary | ICD-10-CM | POA: Diagnosis not present

## 2018-08-05 DIAGNOSIS — L89322 Pressure ulcer of left buttock, stage 2: Secondary | ICD-10-CM | POA: Diagnosis not present

## 2018-08-05 DIAGNOSIS — E441 Mild protein-calorie malnutrition: Secondary | ICD-10-CM | POA: Diagnosis not present

## 2018-08-05 DIAGNOSIS — G800 Spastic quadriplegic cerebral palsy: Secondary | ICD-10-CM | POA: Diagnosis not present

## 2018-08-05 DIAGNOSIS — G40909 Epilepsy, unspecified, not intractable, without status epilepticus: Secondary | ICD-10-CM | POA: Diagnosis not present

## 2018-08-12 DIAGNOSIS — M24521 Contracture, right elbow: Secondary | ICD-10-CM | POA: Diagnosis not present

## 2018-08-12 DIAGNOSIS — L89322 Pressure ulcer of left buttock, stage 2: Secondary | ICD-10-CM | POA: Diagnosis not present

## 2018-08-12 DIAGNOSIS — G800 Spastic quadriplegic cerebral palsy: Secondary | ICD-10-CM | POA: Diagnosis not present

## 2018-08-12 DIAGNOSIS — K5904 Chronic idiopathic constipation: Secondary | ICD-10-CM | POA: Diagnosis not present

## 2018-08-12 DIAGNOSIS — E441 Mild protein-calorie malnutrition: Secondary | ICD-10-CM | POA: Diagnosis not present

## 2018-08-12 DIAGNOSIS — G40909 Epilepsy, unspecified, not intractable, without status epilepticus: Secondary | ICD-10-CM | POA: Diagnosis not present

## 2018-08-19 DIAGNOSIS — L89322 Pressure ulcer of left buttock, stage 2: Secondary | ICD-10-CM | POA: Diagnosis not present

## 2018-08-19 DIAGNOSIS — E441 Mild protein-calorie malnutrition: Secondary | ICD-10-CM | POA: Diagnosis not present

## 2018-08-19 DIAGNOSIS — K5904 Chronic idiopathic constipation: Secondary | ICD-10-CM | POA: Diagnosis not present

## 2018-08-19 DIAGNOSIS — M24521 Contracture, right elbow: Secondary | ICD-10-CM | POA: Diagnosis not present

## 2018-08-19 DIAGNOSIS — G800 Spastic quadriplegic cerebral palsy: Secondary | ICD-10-CM | POA: Diagnosis not present

## 2018-08-19 DIAGNOSIS — G40909 Epilepsy, unspecified, not intractable, without status epilepticus: Secondary | ICD-10-CM | POA: Diagnosis not present

## 2018-10-16 DIAGNOSIS — H6123 Impacted cerumen, bilateral: Secondary | ICD-10-CM | POA: Diagnosis not present

## 2019-05-18 DIAGNOSIS — K5904 Chronic idiopathic constipation: Secondary | ICD-10-CM | POA: Diagnosis not present

## 2019-05-18 DIAGNOSIS — E441 Mild protein-calorie malnutrition: Secondary | ICD-10-CM | POA: Diagnosis not present

## 2019-05-18 DIAGNOSIS — G800 Spastic quadriplegic cerebral palsy: Secondary | ICD-10-CM | POA: Diagnosis not present

## 2019-06-15 DIAGNOSIS — N3001 Acute cystitis with hematuria: Secondary | ICD-10-CM | POA: Diagnosis not present

## 2019-06-15 DIAGNOSIS — N3000 Acute cystitis without hematuria: Secondary | ICD-10-CM | POA: Diagnosis not present

## 2019-07-10 DIAGNOSIS — Z23 Encounter for immunization: Secondary | ICD-10-CM | POA: Diagnosis not present

## 2019-08-04 DIAGNOSIS — N3000 Acute cystitis without hematuria: Secondary | ICD-10-CM | POA: Diagnosis not present

## 2019-12-08 DIAGNOSIS — G808 Other cerebral palsy: Secondary | ICD-10-CM | POA: Diagnosis not present

## 2019-12-08 DIAGNOSIS — R269 Unspecified abnormalities of gait and mobility: Secondary | ICD-10-CM | POA: Diagnosis not present

## 2019-12-08 DIAGNOSIS — R6250 Unspecified lack of expected normal physiological development in childhood: Secondary | ICD-10-CM | POA: Diagnosis not present

## 2019-12-17 DIAGNOSIS — Z23 Encounter for immunization: Secondary | ICD-10-CM | POA: Diagnosis not present

## 2020-01-13 DIAGNOSIS — Z23 Encounter for immunization: Secondary | ICD-10-CM | POA: Diagnosis not present

## 2020-04-06 DIAGNOSIS — Z993 Dependence on wheelchair: Secondary | ICD-10-CM | POA: Diagnosis not present

## 2020-12-05 ENCOUNTER — Other Ambulatory Visit: Payer: Self-pay | Admitting: Family Medicine

## 2020-12-07 ENCOUNTER — Other Ambulatory Visit: Payer: Self-pay | Admitting: Family Medicine

## 2020-12-07 MED ORDER — CHLORHEXIDINE GLUCONATE 0.12 % MT SOLN: 15.0000 mL | Freq: Two times a day (BID) | OROMUCOSAL | 5 refills | Status: DC

## 2020-12-29 DIAGNOSIS — G808 Other cerebral palsy: Secondary | ICD-10-CM | POA: Diagnosis not present

## 2020-12-29 DIAGNOSIS — R269 Unspecified abnormalities of gait and mobility: Secondary | ICD-10-CM | POA: Diagnosis not present

## 2020-12-29 DIAGNOSIS — R6250 Unspecified lack of expected normal physiological development in childhood: Secondary | ICD-10-CM | POA: Diagnosis not present

## 2020-12-29 DIAGNOSIS — R471 Dysarthria and anarthria: Secondary | ICD-10-CM | POA: Diagnosis not present

## 2021-01-02 ENCOUNTER — Encounter: Payer: Self-pay | Admitting: Family Medicine

## 2021-01-02 ENCOUNTER — Other Ambulatory Visit: Payer: Self-pay

## 2021-01-02 ENCOUNTER — Ambulatory Visit (INDEPENDENT_AMBULATORY_CARE_PROVIDER_SITE_OTHER): Payer: Medicare Other | Admitting: Family Medicine

## 2021-01-02 VITALS — BP 102/80 | HR 77 | Temp 97.4°F

## 2021-01-02 DIAGNOSIS — R4782 Fluency disorder in conditions classified elsewhere: Secondary | ICD-10-CM | POA: Diagnosis not present

## 2021-01-02 DIAGNOSIS — R200 Anesthesia of skin: Secondary | ICD-10-CM

## 2021-01-02 DIAGNOSIS — E440 Moderate protein-calorie malnutrition: Secondary | ICD-10-CM | POA: Diagnosis not present

## 2021-01-02 DIAGNOSIS — K029 Dental caries, unspecified: Secondary | ICD-10-CM

## 2021-01-02 DIAGNOSIS — G800 Spastic quadriplegic cerebral palsy: Secondary | ICD-10-CM | POA: Diagnosis not present

## 2021-01-02 DIAGNOSIS — M62838 Other muscle spasm: Secondary | ICD-10-CM

## 2021-01-02 DIAGNOSIS — K5904 Chronic idiopathic constipation: Secondary | ICD-10-CM

## 2021-01-02 DIAGNOSIS — R6250 Unspecified lack of expected normal physiological development in childhood: Secondary | ICD-10-CM | POA: Diagnosis not present

## 2021-01-02 DIAGNOSIS — H6123 Impacted cerumen, bilateral: Secondary | ICD-10-CM

## 2021-01-02 DIAGNOSIS — R351 Nocturia: Secondary | ICD-10-CM | POA: Diagnosis not present

## 2021-01-02 DIAGNOSIS — R202 Paresthesia of skin: Secondary | ICD-10-CM

## 2021-01-02 DIAGNOSIS — R269 Unspecified abnormalities of gait and mobility: Secondary | ICD-10-CM | POA: Diagnosis not present

## 2021-01-02 DIAGNOSIS — R196 Halitosis: Secondary | ICD-10-CM

## 2021-01-02 NOTE — Progress Notes (Signed)
Subjective:  Patient ID: Bradley [name], male    DOB: 1976/04/01  Age: 45 y.o. MRN: [id number]  Chief Complaint  Patient presents with  . Cerebral Palsy    HPI  Bradley [name] is a 45 year old left-handed man with a history of prematurity of birth, cerebral palsy, meningitis during infancy, and hydrocephalus requiring a ventriculoperitoneal shunt. He continues to have rather severe spasticity throughout all 4 extremities. His legs are almost always straight and crossed (scissored). His elbows and wrists are typically flexed. He has upper extremity flexion contractures. His appetite is good. He drinks lots of fluids and urinates approximately 4 times at night. His primary caregivers are his mother and father. Denies any sores on skin. He likes to play with lincoln logs, but has had decreased function in his left hand since his UTI in 06/2018. He is able to speak better and his parents are able to understand him.     Current Outpatient Medications on File Prior to Visit  Medication Sig Dispense Refill  . Docusate Sodium (DSS) 100 MG CAPS Take 1 capsule by mouth in the morning, at noon, and at bedtime.    Marland Kitchen ascorbic acid (VITAMIN C) 100 MG tablet Take 100 mg by mouth daily.    . chlorhexidine (PERIDEX) 0.12 % solution Use as directed 15 mLs in the mouth or throat 2 (two) times daily. 120 mL 5  . Multiple Vitamin (MULTIVITAMIN) capsule Take 1 capsule by mouth daily.    . Omega-3 1000 MG CAPS Take 1,000 mg by mouth daily.     No current facility-administered medications on file prior to visit.   Past Medical History:  Diagnosis Date  . Cerebral palsy, hemiplegic (HCC)   . Chronic idiopathic constipation   . Seizure disorder [facility] [address] [address])    Past Surgical History:  Procedure Laterality Date  . ADENOIDECTOMY    . cranial-peritoneal shunt    . TONSILLECTOMY      Family History  Problem Relation Age of Onset  . Hyperlipidemia Mother   . Hypertension Mother   . Diabetes Father   . Migraines Other    . Osteoarthritis Other   . Cancer Other        Prostate and Breast  . Heart attack Other    Social History   Socioeconomic History  . Marital status: Single    Spouse name: Not on file  . Number of children: Not on file  . Years of education: Not on file  . Highest education level: Not on file  Occupational History  . Occupation: Disabled  Tobacco Use  . Smoking status: Never Smoker  . Smokeless tobacco: Never Used  Substance and Sexual Activity  . Alcohol use: Never  . Drug use: Never  . Sexual activity: Never  Other Topics Concern  . Not on file  Social History Narrative  . Not on file   Social Determinants of Health   Financial Resource Strain: Not on file  Food Insecurity: Not on file  Transportation Needs: Not on file  Physical Activity: Not on file  Stress: Not on file  Social Connections: Not on file    Review of Systems  Constitutional: Negative for chills, diaphoresis, fatigue and fever.  HENT: Positive for rhinorrhea. Negative for congestion, ear pain and sore throat.   Respiratory: Negative for cough and shortness of breath.   Cardiovascular: Negative for chest pain and leg swelling.  Gastrointestinal: Negative for abdominal pain, constipation, diarrhea, nausea and vomiting.  Endocrine: Positive for polydipsia and polyuria (  nocturia 4 times a night).  Genitourinary: Negative for dysuria and urgency.  Musculoskeletal: Negative for arthralgias and myalgias.  Allergic/Immunologic: Positive for environmental allergies.  Neurological: Negative for dizziness and headaches.  Psychiatric/Behavioral: Negative for dysphoric mood.     Objective:  BP 102/80   Pulse 77   Temp (!) 97.4 F (36.3 C)   SpO2 99%   BP/Weight 01/02/2021  Systolic BP 102  Diastolic BP 80    Physical Exam Vitals reviewed.  Constitutional:      Appearance: Normal appearance. He is normal weight.  HENT:     Right Ear: There is impacted cerumen.     Left Ear: There is impacted  cerumen.     Mouth/Throat:     Pharynx: No oropharyngeal exudate or posterior oropharyngeal erythema.     Comments: Missing teeth. Minimal decay.  Halitosis.  Neck:     Vascular: No carotid bruit.  Cardiovascular:     Rate and Rhythm: Normal rate and regular rhythm.     Heart sounds: No murmur heard.   Pulmonary:     Effort: Pulmonary effort is normal.     Breath sounds: Normal breath sounds.  Abdominal:     General: Abdomen is flat. Bowel sounds are normal.     Palpations: Abdomen is soft.     Tenderness: There is no abdominal tenderness.  Musculoskeletal:     Right lower leg: No edema.     Left lower leg: No edema.  Neurological:     Mental Status: He is alert.     Motor: Abnormal muscle tone present.     Coordination: Coordination is intact.     Gait: Gait abnormal.     Comments: Spastic upper and lower legs. Flexion contractures of UE and LE. Hands contractured. Rt hand fully. Lt hand partially.  Legs in scissor position.   Psychiatric:        Mood and Affect: Mood normal.        Behavior: Behavior normal.     Diabetic Foot Exam - Simple   No data filed      Lab Results  Component Value Date   WBC 4.5 01/02/2021   HGB 14.4 01/02/2021   HCT 40.1 01/02/2021   PLT 430 01/02/2021   GLUCOSE 93 01/02/2021   ALT 21 01/02/2021   AST 13 01/02/2021   NA 133 (L) 01/02/2021   K 4.1 01/02/2021   CL 96 01/02/2021   CREATININE 0.47 (L) 01/02/2021   BUN 10 01/02/2021   CO2 22 01/02/2021   TSH 0.373 (L) 01/02/2021      Assessment & Plan:   1. Developmental delay - CBC with Differential/Platelet - Comprehensive metabolic panel - TSH  2. Gait abnormality - CBC with Differential/Platelet - Comprehensive metabolic panel - TSH  3. 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 Dental decay - Continue chlorhexidone.  5. Halitosis - Continue chlorhexidone.  6. Chronic idiopathic  constipation Continue docusate. - TSH  7. Fluency disorder associated with underlying disease Caregivers are able to understand pt.   8. Bilateral impacted cerumen Irrigation BL.   9. Moderate protein-calorie malnutrition (HCC) - B12 and Folate Panel - Methylmalonic acid, serum  10. Numbness and tingling of both feet - B12 and Folate Panel - Methylmalonic acid, serum  11. Muscle spasm of both lower legs - TSH   12. Nocturia: stop fulid intake 3 hours prior to bed.   Orders Placed  This Encounter  Procedures  . CBC with Differential/Platelet  . Comprehensive metabolic panel  . TSH  . B12 and Folate Panel  . Methylmalonic acid, serum     Follow-up: Return in about 6 months (around 07/04/2021) for not fasting. Marland Kitchen  An After Visit Summary was printed and given to the patient.  Blane Ohara, MD  Family Practice [phone]

## 2021-01-02 NOTE — Patient Instructions (Signed)
Stop fluid intake 3 hours prior to bed.

## 2021-01-06 LAB — CBC WITH DIFFERENTIAL/PLATELET
Basophils Absolute: 0 10*3/uL (ref 0.0–0.2)
Basos: 1 %
EOS (ABSOLUTE): 0 10*3/uL (ref 0.0–0.4)
Eos: 0 %
Hematocrit: 40.1 % (ref 37.5–51.0)
Hemoglobin: 14.4 g/dL (ref 13.0–17.7)
Immature Grans (Abs): 0 10*3/uL (ref 0.0–0.1)
Immature Granulocytes: 0 %
Lymphocytes Absolute: 1.4 10*3/uL (ref 0.7–3.1)
Lymphs: 32 %
MCH: 31.8 pg (ref 26.6–33.0)
MCHC: 35.9 g/dL — ABNORMAL HIGH (ref 31.5–35.7)
MCV: 89 fL (ref 79–97)
Monocytes Absolute: 0.3 10*3/uL (ref 0.1–0.9)
Monocytes: 6 %
Neutrophils Absolute: 2.7 10*3/uL (ref 1.4–7.0)
Neutrophils: 61 %
Platelets: 430 10*3/uL (ref 150–450)
RBC: 4.53 x10E6/uL (ref 4.14–5.80)
RDW: 11.5 % — ABNORMAL LOW (ref 11.6–15.4)
WBC: 4.5 10*3/uL (ref 3.4–10.8)

## 2021-01-06 LAB — COMPREHENSIVE METABOLIC PANEL
ALT: 21 IU/L (ref 0–44)
AST: 13 IU/L (ref 0–40)
Albumin/Globulin Ratio: 2 (ref 1.2–2.2)
Albumin: 4.5 g/dL (ref 4.0–5.0)
Alkaline Phosphatase: 82 IU/L (ref 44–121)
BUN/Creatinine Ratio: 21 — ABNORMAL HIGH (ref 9–20)
BUN: 10 mg/dL (ref 6–24)
Bilirubin Total: 0.3 mg/dL (ref 0.0–1.2)
CO2: 22 mmol/L (ref 20–29)
Calcium: 9.7 mg/dL (ref 8.7–10.2)
Chloride: 96 mmol/L (ref 96–106)
Creatinine, Ser: 0.47 mg/dL — ABNORMAL LOW (ref 0.76–1.27)
Globulin, Total: 2.3 g/dL (ref 1.5–4.5)
Glucose: 93 mg/dL (ref 65–99)
Potassium: 4.1 mmol/L (ref 3.5–5.2)
Sodium: 133 mmol/L — ABNORMAL LOW (ref 134–144)
Total Protein: 6.8 g/dL (ref 6.0–8.5)
eGFR: 130 mL/min/{1.73_m2} (ref >59)

## 2021-01-06 LAB — B12 AND FOLATE PANEL
Folate: 10.1 ng/mL (ref >3.0)
Vitamin B-12: 836 pg/mL (ref 232–1245)

## 2021-01-06 LAB — TSH: TSH: 0.373 u[IU]/mL — ABNORMAL LOW (ref 0.450–4.500)

## 2021-01-06 LAB — METHYLMALONIC ACID, SERUM: Methylmalonic Acid: 99 nmol/L (ref 0–378)

## 2021-01-09 ENCOUNTER — Encounter: Payer: Self-pay | Admitting: Family Medicine

## 2021-01-10 LAB — SPECIMEN STATUS REPORT

## 2021-01-10 LAB — T4: T4, Total: 7.1 ug/dL (ref 4.5–12.0)

## 2021-01-25 ENCOUNTER — Other Ambulatory Visit: Payer: Self-pay

## 2021-01-25 ENCOUNTER — Ambulatory Visit (INDEPENDENT_AMBULATORY_CARE_PROVIDER_SITE_OTHER): Payer: Medicare Other

## 2021-01-25 DIAGNOSIS — Z23 Encounter for immunization: Secondary | ICD-10-CM | POA: Diagnosis not present

## 2021-01-25 NOTE — Progress Notes (Signed)
   Covid-19 Vaccination Clinic  Name:  Bradley [name]    MRN: [id number] DOB: 29-Jan-1976  01/25/2021  Mr. Bradley [name] was observed post Covid-19 immunization for 15 minutes without incident. He was provided with Vaccine Information Sheet and instruction to access the V-Safe system.   Bradley [name] was instructed to call 911 with any severe reactions post vaccine: Marland Kitchen Difficulty breathing  . Swelling of face and throat  . A fast heartbeat  . A bad rash all over body  . Dizziness and weakness

## 2021-05-03 ENCOUNTER — Telehealth: Payer: Self-pay

## 2021-05-03 NOTE — Telephone Encounter (Signed)
Patient Mom stated she will pick up cup tomorrow.

## 2021-05-03 NOTE — Telephone Encounter (Signed)
Mother calling requesting to pick up urine specimen cup to bring specimen for UA. States pt has been attempting to go to bathroom every 30-45 minutes. He does not always go but is asking to go that frequently. States he has a hx of infection that he was hospitalized for and mother is worried.   Lorita Officer, [place] 05/03/21 8:53 AM

## 2021-05-04 ENCOUNTER — Ambulatory Visit (INDEPENDENT_AMBULATORY_CARE_PROVIDER_SITE_OTHER): Payer: Medicare Other

## 2021-05-04 DIAGNOSIS — N3000 Acute cystitis without hematuria: Secondary | ICD-10-CM

## 2021-05-04 LAB — POCT URINALYSIS DIPSTICK
Bilirubin, UA: NEGATIVE
Blood, UA: NEGATIVE
Glucose, UA: NEGATIVE
Ketones, UA: NEGATIVE
Nitrite, UA: NEGATIVE
Protein, UA: NEGATIVE
Spec Grav, UA: 1.015 (ref 1.010–1.025)
Urobilinogen, UA: 0.2 E.U./dL
pH, UA: 6 (ref 5.0–8.0)

## 2021-05-04 NOTE — Patient Instructions (Signed)
Continue to monitor for fever, chills or back pain. Will call with culture results when available.

## 2021-05-07 LAB — URINE CULTURE

## 2021-05-08 ENCOUNTER — Other Ambulatory Visit: Payer: Self-pay

## 2021-05-08 MED ORDER — NITROFURANTOIN MONOHYD MACRO 100 MG PO CAPS: 100.0000 mg | ORAL_CAPSULE | Freq: Two times a day (BID) | ORAL | 0 refills | Status: DC

## 2021-07-12 ENCOUNTER — Encounter: Payer: Self-pay | Admitting: Family Medicine

## 2021-07-12 ENCOUNTER — Other Ambulatory Visit: Payer: Self-pay

## 2021-07-12 ENCOUNTER — Ambulatory Visit (INDEPENDENT_AMBULATORY_CARE_PROVIDER_SITE_OTHER): Payer: Medicare Other | Admitting: Family Medicine

## 2021-07-12 VITALS — BP 90/68 | HR 89 | Temp 98.0°F | Resp 14

## 2021-07-12 DIAGNOSIS — G800 Spastic quadriplegic cerebral palsy: Secondary | ICD-10-CM

## 2021-07-12 DIAGNOSIS — R6250 Unspecified lack of expected normal physiological development in childhood: Secondary | ICD-10-CM

## 2021-07-12 DIAGNOSIS — Z13220 Encounter for screening for lipoid disorders: Secondary | ICD-10-CM | POA: Diagnosis not present

## 2021-07-12 DIAGNOSIS — E440 Moderate protein-calorie malnutrition: Secondary | ICD-10-CM

## 2021-07-12 DIAGNOSIS — H6123 Impacted cerumen, bilateral: Secondary | ICD-10-CM

## 2021-07-12 MED ORDER — CYCLOBENZAPRINE HCL 5 MG PO TABS: 5.0000 mg | ORAL_TABLET | Freq: Three times a day (TID) | ORAL | 1 refills | Status: DC

## 2021-07-12 NOTE — Patient Instructions (Signed)
Start flexeril 5 mg one three times a day. 

## 2021-07-12 NOTE — Progress Notes (Signed)
Subjective:  Patient ID: Bradley [name], male    DOB: [birth year]  Age: 45 y.o. MRN: [id number]  Chief Complaint  Patient presents with   Cerebral Palsy    HPI Patient has history of prematurity of birth, cerebral palsy, meningitis during infancy, and hydrocephalus requiring a ventriculoperitoneal shunt. He continues to have rather severe spasticity throughout all 4 extremities. He has upper extremity flexion contractures. His mother mentioned that he is not taking any medication only Peridex oral solution for his mouth.   Current Outpatient Medications on File Prior to Visit  Medication Sig Dispense Refill   chlorhexidine (PERIDEX) 0.12 % solution Use as directed 15 mLs in the mouth or throat 2 (two) times daily. 120 mL 5   No current facility-administered medications on file prior to visit.   Past Medical History:  Diagnosis Date   Cerebral palsy, hemiplegic (HCC)    Chronic idiopathic constipation    Seizure disorder (HCC)    Past Surgical History:  Procedure Laterality Date   ADENOIDECTOMY     cranial-peritoneal shunt     TONSILLECTOMY      Family History  Problem Relation Age of Onset   Hyperlipidemia Mother    Hypertension Mother    Diabetes Father    Migraines Other    Osteoarthritis Other    Cancer Other        Prostate and Breast   Heart attack Other    Social History   Socioeconomic History   Marital status: Single    Spouse name: Not on file   Number of children: Not on file   Years of education: Not on file   Highest education level: Not on file  Occupational History   Occupation: Disabled  Tobacco Use   Smoking status: Never   Smokeless tobacco: Never  Substance and Sexual Activity   Alcohol use: Never   Drug use: Never   Sexual activity: Never  Other Topics Concern   Not on file  Social History Narrative   Not on file   Social Determinants of Health   Financial Resource Strain: Not on file  Food Insecurity: Not on file  Transportation  Needs: Not on file  Physical Activity: Not on file  Stress: Not on file  Social Connections: Not on file    Review of Systems  Constitutional:  Negative for chills, fatigue, fever and unexpected weight change.  HENT:  Negative for congestion, ear pain, sinus pain and sore throat.   Respiratory:  Negative for cough and shortness of breath.   Cardiovascular:  Negative for chest pain and palpitations.  Gastrointestinal:  Negative for abdominal pain, blood in stool, constipation, diarrhea, nausea and vomiting.  Endocrine: Negative for polydipsia.  Genitourinary:  Negative for dysuria.  Musculoskeletal:  Positive for arthralgias. Negative for back pain.  Skin:  Negative for rash.  Neurological:  Positive for weakness. Negative for headaches.       Quadriplegic    Objective:  BP 90/68   Pulse 89   Temp 98 F (36.7 C)   Resp 14   SpO2 96%   BP/Weight 07/12/2021 01/02/2021  Systolic BP 90 102  Diastolic BP 68 80  Wt. (Lbs) - -    Physical Exam Vitals reviewed.  Constitutional:      Comments: Low weight.   HENT:     Right Ear: There is impacted cerumen.     Left Ear: There is impacted cerumen.  Cardiovascular:     Rate and Rhythm: Normal rate and regular  rhythm.     Heart sounds: Normal heart sounds.  Pulmonary:     Effort: Pulmonary effort is normal.     Breath sounds: Normal breath sounds.  Abdominal:     General: Bowel sounds are normal.     Tenderness: There is no abdominal tenderness.  Musculoskeletal:     Comments: Contractures of all extremities.   Neurological:     Mental Status: He is alert.     Comments: Abnormal speech, but able to express himself.   Psychiatric:        Mood and Affect: Mood normal.        Behavior: Behavior normal.    Diabetic Foot Exam - Simple   No data filed      Lab Results  Component Value Date   WBC 3.6 07/12/2021   HGB 14.7 07/12/2021   HCT 42.3 07/12/2021   PLT 424 07/12/2021   GLUCOSE 85 07/12/2021   CHOL 166  07/12/2021   TRIG 47 07/12/2021   HDL 64 07/12/2021   LDLCALC 92 07/12/2021   ALT 19 07/12/2021   AST 14 07/12/2021   NA 131 (L) 07/12/2021   K 4.3 07/12/2021   CL 93 (L) 07/12/2021   CREATININE 0.54 (L) 07/12/2021   BUN 7 07/12/2021   CO2 22 07/12/2021   TSH 0.373 (L) 01/02/2021      Assessment & Plan:   Problem List Items Addressed This Visit       Nervous and Auditory   Spastic quadriplegic cerebral palsy (HCC) - Primary    Start flexeril 5 mg one three times a day.      Bilateral impacted cerumen    Bilateral ear irrigation.      Relevant Orders   Ear Lavage     Other   Moderate protein-calorie malnutrition (HCC)    Recommend protein drinks.      Relevant Orders   CBC with Differential/Platelet (Completed)   Comprehensive metabolic panel (Completed)   Screening cholesterol level   Relevant Orders   Lipid panel (Completed)   Developmental delay  .  Meds ordered this encounter  Medications   cyclobenzaprine (FLEXERIL) 5 MG tablet    Sig: Take 1 tablet (5 mg total) by mouth 3 (three) times daily.    Dispense:  270 tablet    Refill:  1     Orders Placed This Encounter  Procedures   CBC with Differential/Platelet   Comprehensive metabolic panel   Lipid panel   Cardiovascular Risk Assessment   Ear Lavage      Follow-up: Return in about 6 months (around 01/10/2022) for awv, chronic fasting.  An After Visit Summary was printed and given to the patient.  Blane Ohara, MD  Family Practice [phone]

## 2021-07-13 LAB — LIPID PANEL
Chol/HDL Ratio: 2.6 ratio (ref 0.0–5.0)
Cholesterol, Total: 166 mg/dL (ref 100–199)
HDL: 64 mg/dL (ref >39)
LDL Chol Calc (NIH): 92 mg/dL (ref 0–99)
Triglycerides: 47 mg/dL (ref 0–149)
VLDL Cholesterol Cal: 10 mg/dL (ref 5–40)

## 2021-07-13 LAB — COMPREHENSIVE METABOLIC PANEL
ALT: 19 IU/L (ref 0–44)
AST: 14 IU/L (ref 0–40)
Albumin/Globulin Ratio: 1.9 (ref 1.2–2.2)
Albumin: 4.8 g/dL (ref 4.0–5.0)
Alkaline Phosphatase: 107 IU/L (ref 44–121)
BUN/Creatinine Ratio: 13 (ref 9–20)
BUN: 7 mg/dL (ref 6–24)
Bilirubin Total: 0.4 mg/dL (ref 0.0–1.2)
CO2: 22 mmol/L (ref 20–29)
Calcium: 9.8 mg/dL (ref 8.7–10.2)
Chloride: 93 mmol/L — ABNORMAL LOW (ref 96–106)
Creatinine, Ser: 0.54 mg/dL — ABNORMAL LOW (ref 0.76–1.27)
Globulin, Total: 2.5 g/dL (ref 1.5–4.5)
Glucose: 85 mg/dL (ref 70–99)
Potassium: 4.3 mmol/L (ref 3.5–5.2)
Sodium: 131 mmol/L — ABNORMAL LOW (ref 134–144)
Total Protein: 7.3 g/dL (ref 6.0–8.5)
eGFR: 124 mL/min/{1.73_m2} (ref >59)

## 2021-07-13 LAB — CBC WITH DIFFERENTIAL/PLATELET
Basophils Absolute: 0 10*3/uL (ref 0.0–0.2)
Basos: 1 %
EOS (ABSOLUTE): 0 10*3/uL (ref 0.0–0.4)
Eos: 1 %
Hematocrit: 42.3 % (ref 37.5–51.0)
Hemoglobin: 14.7 g/dL (ref 13.0–17.7)
Immature Grans (Abs): 0 10*3/uL (ref 0.0–0.1)
Immature Granulocytes: 0 %
Lymphocytes Absolute: 1.2 10*3/uL (ref 0.7–3.1)
Lymphs: 32 %
MCH: 31.5 pg (ref 26.6–33.0)
MCHC: 34.8 g/dL (ref 31.5–35.7)
MCV: 91 fL (ref 79–97)
Monocytes Absolute: 0.2 10*3/uL (ref 0.1–0.9)
Monocytes: 6 %
Neutrophils Absolute: 2.2 10*3/uL (ref 1.4–7.0)
Neutrophils: 60 %
Platelets: 424 10*3/uL (ref 150–450)
RBC: 4.67 x10E6/uL (ref 4.14–5.80)
RDW: 12.1 % (ref 11.6–15.4)
WBC: 3.6 10*3/uL (ref 3.4–10.8)

## 2021-07-15 DIAGNOSIS — E440 Moderate protein-calorie malnutrition: Secondary | ICD-10-CM | POA: Insufficient documentation

## 2021-07-15 DIAGNOSIS — Z13220 Encounter for screening for lipoid disorders: Secondary | ICD-10-CM | POA: Insufficient documentation

## 2021-07-15 DIAGNOSIS — H6123 Impacted cerumen, bilateral: Secondary | ICD-10-CM | POA: Insufficient documentation

## 2021-07-15 DIAGNOSIS — G800 Spastic quadriplegic cerebral palsy: Secondary | ICD-10-CM | POA: Insufficient documentation

## 2021-07-17 NOTE — Assessment & Plan Note (Signed)
Bilateral ear irrigation

## 2021-07-17 NOTE — Assessment & Plan Note (Signed)
Start flexeril 5 mg one three times a day.

## 2021-07-23 MED ORDER — CYCLOBENZAPRINE HCL 5 MG PO TABS: 5.0000 mg | ORAL_TABLET | Freq: Three times a day (TID) | ORAL | 1 refills | Status: DC

## 2021-07-23 NOTE — Assessment & Plan Note (Signed)
Recommend protein drinks.

## 2021-10-10 ENCOUNTER — Encounter: Payer: Self-pay | Admitting: Family Medicine

## 2021-10-10 ENCOUNTER — Ambulatory Visit (INDEPENDENT_AMBULATORY_CARE_PROVIDER_SITE_OTHER): Payer: Medicare Other

## 2021-10-10 DIAGNOSIS — Z23 Encounter for immunization: Secondary | ICD-10-CM

## 2021-11-08 ENCOUNTER — Telehealth: Payer: Self-pay

## 2021-11-08 NOTE — Telephone Encounter (Signed)
I spoke with Bradley [name] who is going to call the office back about his appointment. Appointment for 01/10/2022 has been canceled due to the provider being out of the office.

## 2021-12-27 ENCOUNTER — Ambulatory Visit (INDEPENDENT_AMBULATORY_CARE_PROVIDER_SITE_OTHER): Payer: Medicare Other | Admitting: Family Medicine

## 2021-12-27 ENCOUNTER — Encounter: Payer: Self-pay | Admitting: Family Medicine

## 2021-12-27 VITALS — BP 110/78 | HR 68 | Temp 97.3°F | Resp 18

## 2021-12-27 DIAGNOSIS — G800 Spastic quadriplegic cerebral palsy: Secondary | ICD-10-CM

## 2021-12-27 DIAGNOSIS — N401 Enlarged prostate with lower urinary tract symptoms: Secondary | ICD-10-CM | POA: Diagnosis not present

## 2021-12-27 DIAGNOSIS — R3914 Feeling of incomplete bladder emptying: Secondary | ICD-10-CM

## 2021-12-27 DIAGNOSIS — H6123 Impacted cerumen, bilateral: Secondary | ICD-10-CM | POA: Diagnosis not present

## 2021-12-27 DIAGNOSIS — R350 Frequency of micturition: Secondary | ICD-10-CM | POA: Diagnosis not present

## 2021-12-27 DIAGNOSIS — R10816 Epigastric abdominal tenderness: Secondary | ICD-10-CM

## 2021-12-27 LAB — POCT URINALYSIS DIPSTICK
Bilirubin, UA: NEGATIVE
Blood, UA: NEGATIVE
Glucose, UA: NEGATIVE
Ketones, UA: NEGATIVE
Leukocytes, UA: NEGATIVE
Nitrite, UA: NEGATIVE
Protein, UA: NEGATIVE
Spec Grav, UA: 1.01 (ref 1.010–1.025)
Urobilinogen, UA: 0.2 E.U./dL
pH, UA: 8.5 — AB (ref 5.0–8.0)

## 2021-12-27 MED ORDER — OMEPRAZOLE 40 MG PO CPDR: 40.0000 mg | DELAYED_RELEASE_CAPSULE | Freq: Two times a day (BID) | ORAL | 1 refills | Status: DC

## 2021-12-27 MED ORDER — TAMSULOSIN HCL 0.4 MG PO CAPS: 0.4000 mg | ORAL_CAPSULE | Freq: Every day | ORAL | 3 refills | Status: DC

## 2021-12-27 MED ORDER — BACLOFEN 10 MG PO TABS: 10.0000 mg | ORAL_TABLET | Freq: Four times a day (QID) | ORAL | 3 refills | Status: DC

## 2021-12-27 NOTE — Assessment & Plan Note (Signed)
Start baclofen 10 mg four times a day as needed ?

## 2021-12-27 NOTE — Patient Instructions (Addendum)
Start on omeprazole 40 mg twice daily.  ?Start baclofen 10 mg four times a day as needed  ?Check labs.  ?Start tamsulosin 0.4 mg before bed to see if it helps with urinary frequency.  ? ?

## 2021-12-27 NOTE — Assessment & Plan Note (Signed)
Start tamsulosin 0.4 mg before bed to see if it helps with urinary frequency.  ?

## 2021-12-27 NOTE — Assessment & Plan Note (Addendum)
Start on omeprazole 40 mg twice daily. ?Labs drawn today. ?

## 2021-12-27 NOTE — Progress Notes (Signed)
? ?Acute Office Visit ? ?Subjective:  ? ? Patient ID: Bradley [name], male    DOB: April 08, 1976, 46 y.o.   MRN: [id number] ? ?Chief Complaint  ?Patient presents with  ? Abdominal Pain  ? Urinary Frequency  ? ? ?HPI ?Patient is in today for evaluation of upper abdominal pain and urinary frequency and burning.  His Mother reports that he has been having urinary frequency for the last 2 weeks with occasional complaints of upper abdominal pain.   ? ?Past Medical History:  ?Diagnosis Date  ? Cerebral palsy, hemiplegic ([place])   ? Chronic idiopathic constipation   ? Seizure disorder ([place])   ? ? ?Past Surgical History:  ?Procedure Laterality Date  ? ADENOIDECTOMY    ? cranial-peritoneal shunt    ? TONSILLECTOMY    ? ? ?Family History  ?Problem Relation Age of Onset  ? Hyperlipidemia Mother   ? Hypertension Mother   ? Diabetes Father   ? Migraines Other   ? Osteoarthritis Other   ? Cancer Other   ?     Prostate and Breast  ? Heart attack Other   ? ? ?Social History  ? ?Socioeconomic History  ? Marital status: Single  ?  Spouse name: Not on file  ? Number of children: Not on file  ? Years of education: Not on file  ? Highest education level: Not on file  ?Occupational History  ? Occupation: Disabled  ?Tobacco Use  ? Smoking status: Never  ? Smokeless tobacco: Never  ?Substance and Sexual Activity  ? Alcohol use: Never  ? Drug use: Never  ? Sexual activity: Never  ?Other Topics Concern  ? Not on file  ?Social History Narrative  ? Not on file  ? ?Social Determinants of Health  ? ?Financial Resource Strain: Not on file  ?Food Insecurity: Not on file  ?Transportation Needs: Not on file  ?Physical Activity: Not on file  ?Stress: Not on file  ?Social Connections: Not on file  ?Intimate Partner Violence: Not on file  ? ? ?Outpatient Medications Prior to Visit  ?Medication Sig Dispense Refill  ? chlorhexidine (PERIDEX) 0.12 % solution Use as directed 15 mLs in the mouth or throat 2 (two) times daily. 120 mL 5  ? cyclobenzaprine  (FLEXERIL) 5 MG tablet Take 1 tablet (5 mg total) by mouth 3 (three) times daily. 270 tablet 1  ? ?No facility-administered medications prior to visit.  ? ? ?Allergies  ?Allergen Reactions  ? Morphine Other (See Comments)  ?  Other reaction(s): Unknown ?Had a hard time waking up after surgery. ?Had a hard time waking up after surgery. ?  ? ? ?Review of Systems  ?Constitutional:  Negative for fatigue and fever.  ?HENT:  Positive for ear pain.   ?Respiratory:  Negative for cough and shortness of breath.   ?Cardiovascular:  Negative for chest pain.  ?Gastrointestinal:  Positive for abdominal pain. Negative for constipation, diarrhea and nausea.  ?Genitourinary:  Positive for dysuria and frequency.  ? ?   ?Objective:  ?  ?Physical Exam ?Vitals reviewed.  ?Constitutional:   ?   Appearance: Normal appearance. He is well-developed.  ?HENT:  ?   Right Ear: Tympanic membrane and external ear normal.  ?   Left Ear: Tympanic membrane and external ear normal.  ?   Nose: Nose normal.  ?Cardiovascular:  ?   Pulses: Normal pulses.  ?Abdominal:  ?   General: Bowel sounds are normal.  ?   Palpations: Abdomen is soft.  ?  Tenderness: There is abdominal tenderness in the epigastric area.  ?Musculoskeletal:  ?   Cervical back: Normal range of motion.  ?   Comments: Spastic limbs (UE AND LE.) ROM limited.   ?Neurological:  ?   Mental Status: He is alert and oriented to person, place, and time.  ?Psychiatric:     ?   Mood and Affect: Mood normal.     ?   Behavior: Behavior normal.  ? ? ?BP 110/78 (BP Location: Left Arm, Patient Position: Sitting)   Pulse 68   Temp (!) 97.3 ?F (36.3 ?C) (Oral)   Resp 18  ?Wt Readings from Last 3 Encounters:  ?No data found for Wt  ? ? ?Health Maintenance Due  ?Topic Date Due  ? HIV Screening  Never done  ? Hepatitis C Screening  Never done  ? TETANUS/TDAP  Never done  ? COLONOSCOPY (Pts 45-4yr Insurance coverage will need to be confirmed)  Never done  ? COVID-19 Vaccine (3 - Booster for Moderna  series) 03/22/2021  ? ? ?There are no preventive care reminders to display for this patient. ? ? ?Lab Results  ?Component Value Date  ? TSH 0.373 (L) 01/02/2021  ? ?Lab Results  ?Component Value Date  ? WBC 3.6 07/12/2021  ? HGB 14.7 07/12/2021  ? HCT 42.3 07/12/2021  ? MCV 91 07/12/2021  ? PLT 424 07/12/2021  ? ?Lab Results  ?Component Value Date  ? NA 131 (L) 07/12/2021  ? K 4.3 07/12/2021  ? CO2 22 07/12/2021  ? GLUCOSE 85 07/12/2021  ? BUN 7 07/12/2021  ? CREATININE 0.54 (L) 07/12/2021  ? BILITOT 0.4 07/12/2021  ? ALKPHOS 107 07/12/2021  ? AST 14 07/12/2021  ? ALT 19 07/12/2021  ? PROT 7.3 07/12/2021  ? ALBUMIN 4.8 07/12/2021  ? CALCIUM 9.8 07/12/2021  ? EGFR 124 07/12/2021  ? ?Lab Results  ?Component Value Date  ? CHOL 166 07/12/2021  ? ?Lab Results  ?Component Value Date  ? HDL 64 07/12/2021  ? ?Lab Results  ?Component Value Date  ? [place] 07/12/2021  ? ?Lab Results  ?Component Value Date  ? TRIG 47 07/12/2021  ? ?Lab Results  ?Component Value Date  ? CHOLHDL 2.6 07/12/2021  ? ?No results found for: HGBA1C ? ?   ?Assessment & Plan:  ? ?Problem List Items Addressed This Visit   ? ?  ? Nervous and Auditory  ? Impacted cerumen of both ears  ?  BL successful irrigation.  ?  ?  ? [place]ri[place]  ?  Start baclofen 10 mg four times a day as needed ?  ?  ?  ? Genitourinary  ? Benign prostatic hyperplasia with incomplete bladder emptying  ?  Start tamsulosin 0.4 mg before bed to see if it helps with urinary frequency.  ?  ?  ? Relevant Medications  ? tamsulosin (FLOMAX) 0.4 MG CAPS capsule  ? Other Relevant Orders  ? PSA  ?  ? Other  ? Urinary frequency - Primary  ? Relevant Orders  ? POCT urinalysis dipstick (Completed)  ? PSA  ? Epigastric abdominal tenderness without rebound tenderness  ?  Start on omeprazole 40 mg twice daily. ?Labs drawn today. ?  ?  ? Relevant Medications  ? omeprazole (PRILOSEC) 40 MG capsule  ? Other Relevant Orders  ? CBC with Differential/Platelet  ?  Comprehensive metabolic panel  ? Helicobacter pylori abs-IgG+IgA, bld  ? ?Meds ordered this encounter  ?Medications  ? tamsulosin (  FLOMAX) 0.4 MG CAPS capsule  ?  Sig: Take 1 capsule (0.4 mg total) by mouth daily after supper.  ?  Dispense:  30 capsule  ?  Refill:  3  ? omeprazole (PRILOSEC) 40 MG capsule  ?  Sig: Take 1 capsule (40 mg total) by mouth in the morning and at bedtime.  ?  Dispense:  60 capsule  ?  Refill:  1  ? baclofen (LIORESAL) 10 MG tablet  ?  Sig: Take 1 tablet (10 mg total) by mouth 4 (four) times daily.  ?  Dispense:  120 each  ?  Refill:  3  ? ? ?Orders Placed This Encounter  ?Procedures  ? CBC with Differential/Platelet  ? Comprehensive metabolic panel  ? Helicobacter pylori abs-IgG+IgA, bld  ? PSA  ? POCT urinalysis dipstick  ?  ? ?Follow-up: Return in about 3 months (around 03/29/2022) for chronic follow up. ? ?An After Visit Summary was printed and given to the patient. ? ?I,Lauren M Auman,acting as a scribe for Rochel Brome, MD.,have documented all relevant documentation on the behalf of Rochel Brome, MD,as directed by  Rochel Brome, MD while in the presence of Rochel Brome, MD.  ? ?Rochel Brome, MD ?[place] ?[phone]

## 2021-12-30 NOTE — Assessment & Plan Note (Signed)
BL successful irrigation.  ?

## 2022-01-01 LAB — CBC WITH DIFFERENTIAL/PLATELET
Basophils Absolute: 0 10*3/uL (ref 0.0–0.2)
Basos: 1 %
EOS (ABSOLUTE): 0 10*3/uL (ref 0.0–0.4)
Eos: 1 %
Hematocrit: 38.7 % (ref 37.5–51.0)
Hemoglobin: 13.7 g/dL (ref 13.0–17.7)
Immature Grans (Abs): 0 10*3/uL (ref 0.0–0.1)
Immature Granulocytes: 0 %
Lymphocytes Absolute: 1.4 10*3/uL (ref 0.7–3.1)
Lymphs: 30 %
MCH: 31.5 pg (ref 26.6–33.0)
MCHC: 35.4 g/dL (ref 31.5–35.7)
MCV: 89 fL (ref 79–97)
Monocytes Absolute: 0.3 10*3/uL (ref 0.1–0.9)
Monocytes: 7 %
Neutrophils Absolute: 2.9 10*3/uL (ref 1.4–7.0)
Neutrophils: 61 %
Platelets: 463 10*3/uL — ABNORMAL HIGH (ref 150–450)
RBC: 4.35 x10E6/uL (ref 4.14–5.80)
RDW: 11.8 % (ref 11.6–15.4)
WBC: 4.7 10*3/uL (ref 3.4–10.8)

## 2022-01-01 LAB — COMPREHENSIVE METABOLIC PANEL
ALT: 21 IU/L (ref 0–44)
AST: 15 IU/L (ref 0–40)
Albumin/Globulin Ratio: 2 (ref 1.2–2.2)
Albumin: 4.5 g/dL (ref 4.0–5.0)
Alkaline Phosphatase: 114 IU/L (ref 44–121)
BUN/Creatinine Ratio: 13 (ref 9–20)
BUN: 6 mg/dL (ref 6–24)
Bilirubin Total: 0.5 mg/dL (ref 0.0–1.2)
CO2: 22 mmol/L (ref 20–29)
Calcium: 9.7 mg/dL (ref 8.7–10.2)
Chloride: 97 mmol/L (ref 96–106)
Creatinine, Ser: 0.45 mg/dL — ABNORMAL LOW (ref 0.76–1.27)
Globulin, Total: 2.3 g/dL (ref 1.5–4.5)
Glucose: 87 mg/dL (ref 70–99)
Potassium: 4.1 mmol/L (ref 3.5–5.2)
Sodium: 135 mmol/L (ref 134–144)
Total Protein: 6.8 g/dL (ref 6.0–8.5)
eGFR: 131 mL/min/{1.73_m2} (ref >59)

## 2022-01-01 LAB — HELICOBACTER PYLORI ABS-IGG+IGA, BLD
H. pylori, IgA Abs: 12.4 units — ABNORMAL HIGH (ref 0.0–8.9)
H. pylori, IgG AbS: 0.24 Index Value (ref 0.00–0.79)

## 2022-01-01 LAB — PSA: Prostate Specific Ag, Serum: 0.4 ng/mL (ref 0.0–4.0)

## 2022-01-02 ENCOUNTER — Other Ambulatory Visit: Payer: Self-pay

## 2022-01-02 MED ORDER — AMOXICILLIN 875 MG PO TABS: 875.0000 mg | ORAL_TABLET | Freq: Two times a day (BID) | ORAL | 0 refills | Status: AC

## 2022-01-02 MED ORDER — CLARITHROMYCIN 500 MG PO TABS: 500.0000 mg | ORAL_TABLET | Freq: Two times a day (BID) | ORAL | 0 refills | Status: DC

## 2022-01-02 NOTE — Telephone Encounter (Signed)
Refill sent to pharmacy.   

## 2022-01-08 ENCOUNTER — Telehealth: Payer: Self-pay

## 2022-01-08 NOTE — Telephone Encounter (Signed)
Mr. Vanaman called with concerns about Bradley [name].  He continues to have urinary frequency.  He reports no fever or chills.  He currently is taking two antibiotics for his H. Pylori and he is taking the flomax as prescribed.  Mr. Herbers wanted him seen by Dr. Sedalia Muta who is out of the office this week. He was offered an appointment with one of the other providers but he is going to discuss this with his wife and call us back.  He said he may carry him to [facility] to the ED.  He was encouraged to have him evaluated with someone today or tomorrow.   ?

## 2022-01-10 ENCOUNTER — Ambulatory Visit: Payer: Medicare Other | Admitting: Family Medicine

## 2022-01-24 ENCOUNTER — Encounter: Payer: Self-pay | Admitting: Family Medicine

## 2022-01-24 ENCOUNTER — Ambulatory Visit (INDEPENDENT_AMBULATORY_CARE_PROVIDER_SITE_OTHER): Payer: Medicare Other | Admitting: Family Medicine

## 2022-01-24 VITALS — BP 124/64 | HR 80 | Temp 97.3°F

## 2022-01-24 DIAGNOSIS — G914 Hydrocephalus in diseases classified elsewhere: Secondary | ICD-10-CM | POA: Diagnosis not present

## 2022-01-24 DIAGNOSIS — G9349 Other encephalopathy: Secondary | ICD-10-CM

## 2022-01-24 DIAGNOSIS — A048 Other specified bacterial intestinal infections: Secondary | ICD-10-CM

## 2022-01-24 DIAGNOSIS — R1013 Epigastric pain: Secondary | ICD-10-CM | POA: Diagnosis not present

## 2022-01-24 DIAGNOSIS — Z0000 Encounter for general adult medical examination without abnormal findings: Secondary | ICD-10-CM | POA: Diagnosis not present

## 2022-01-24 DIAGNOSIS — E440 Moderate protein-calorie malnutrition: Secondary | ICD-10-CM | POA: Diagnosis not present

## 2022-01-24 DIAGNOSIS — G800 Spastic quadriplegic cerebral palsy: Secondary | ICD-10-CM | POA: Diagnosis not present

## 2022-01-24 MED ORDER — BACLOFEN 20 MG PO TABS: 20.0000 mg | ORAL_TABLET | Freq: Four times a day (QID) | ORAL | 2 refills | Status: DC

## 2022-01-24 NOTE — Progress Notes (Addendum)
Subjective:   Bradley [name] is a 46 y.o. male who presents for Medicare Annual/Subsequent preventive examination. Patient's pmhx was reviewed. He lives with his parents. He has Spastic quadriplegic cerebral palsy, chronic encephalopathy, hydrocephalus s/p numerous shunts several years ago, moderate protein calorie malnutrition, speech abnormality. Patient is dependent on his parents for bathing, dressing, going to restroom, feeding/meal prep. Patient's mother reports his muscle spasms are worse. He is currently on baclofen 10 mg four times a day . Patient sees Dr. Dominga Ferry at [facility] annually.   Patient has a hospital bed however they have had it for many years and it is need of a of a new mattress.  They do not have a Hoyer lift.  His mother does all of the lifting.  He has progressed with his spasms and is unable to assist her at all for transfers.  Patient has a bedside commode however the height is too high for him and needs adjusting.  They have no personal care services.  They have no home health care currently.   Patient had labwork 12/27/2021 due to abdominal pain. It was positive for H. Pylori. He has completed his treatment. It has improved, but not gone. Patient is still on ppi twice daily     Review of Systems  Constitutional:  Negative for chills, fever and malaise/fatigue.  HENT:  Negative for ear pain, sinus pain and sore throat.   Respiratory:  Negative for cough and shortness of breath.   Cardiovascular:  Negative for chest pain.  Gastrointestinal:  Positive for abdominal pain (epigastric). Negative for constipation, diarrhea, nausea and vomiting.  Genitourinary:  Positive for frequency and urgency. Negative for dysuria.  Musculoskeletal:  Negative for myalgias.  Neurological:  Negative for headaches.     Objective:    Today's Vitals   01/24/22 1119  BP: 124/64  Pulse: 80  Temp: (!) 97.3 F (36.3 C)  SpO2: 99%   There is no height or weight on file to calculate  BMI. Physical Exam Vitals reviewed.  Constitutional:      General: He is not in acute distress.    Appearance: He is not toxic-appearing.     Comments: Malnourished. In wheelchair. Frail.  Contractures of arms and legs.   HENT:     Right Ear: Tympanic membrane and ear canal normal.     Left Ear: Tympanic membrane and ear canal normal.     Nose: Nose normal.     Mouth/Throat:     Comments: Poor dentition Neck:     Vascular: No carotid bruit.  Cardiovascular:     Rate and Rhythm: Normal rate and regular rhythm.     Heart sounds: Normal heart sounds.  Pulmonary:     Effort: Pulmonary effort is normal.     Breath sounds: Normal breath sounds.  Abdominal:     General: Bowel sounds are normal.     Palpations: Abdomen is soft.     Tenderness: There is abdominal tenderness (epigastric). There is no guarding or rebound.  Musculoskeletal:        General: Deformity (contractures of legs, arms, neck) present.  Skin:    General: Skin is warm and dry.  Neurological:     Mental Status: He is alert. Mental status is at baseline.     Coordination: Coordination abnormal.  Psychiatric:        Mood and Affect: Mood normal.       01/24/2022   11:10 AM  Advanced Directives  Does Patient Have a  Medical Advance Directive? Yes  Does patient want to make changes to medical advance directive? Yes (ED - Information included in AVS)    Current Medications (verified) Outpatient Encounter Medications as of 01/24/2022  Medication Sig   baclofen (LIORESAL) 20 MG tablet Take 1 tablet (20 mg total) by mouth 4 (four) times daily.   omeprazole (PRILOSEC) 40 MG capsule Take 1 capsule (40 mg total) by mouth in the morning and at bedtime.   tamsulosin (FLOMAX) 0.4 MG CAPS capsule Take 1 capsule (0.4 mg total) by mouth daily after supper.   [DISCONTINUED] baclofen (LIORESAL) 10 MG tablet Take 1 tablet (10 mg total) by mouth 4 (four) times daily.   [DISCONTINUED] chlorhexidine (PERIDEX) 0.12 % solution Use as  directed 15 mLs in the mouth or throat 2 (two) times daily.   [DISCONTINUED] clarithromycin (BIAXIN) 500 MG tablet Take 1 tablet (500 mg total) by mouth 2 (two) times daily.   No facility-administered encounter medications on file as of 01/24/2022.    Allergies (verified) Morphine   History: Past Medical History:  Diagnosis Date   Cerebral palsy, hemiplegic (HCC)    Chronic idiopathic constipation    Seizure disorder (HCC)    Past Surgical History:  Procedure Laterality Date   ADENOIDECTOMY     cranial-peritoneal shunt     TONSILLECTOMY     Family History  Problem Relation Age of Onset   Hyperlipidemia Mother    Hypertension Mother    Diabetes Father    Migraines Other    Osteoarthritis Other    Cancer Other        Prostate and Breast   Heart attack Other    Social History   Socioeconomic History   Marital status: Single    Spouse name: Not on file   Number of children: Not on file   Years of education: Not on file   Highest education level: Not on file  Occupational History   Occupation: Disabled  Tobacco Use   Smoking status: Never   Smokeless tobacco: Never  Substance and Sexual Activity   Alcohol use: Never   Drug use: Never   Sexual activity: Never  Other Topics Concern   Not on file  Social History Narrative   Not on file   Social Determinants of Health   Financial Resource Strain: Low Risk    Difficulty of Paying Living Expenses: Not hard at all  Food Insecurity: No Food Insecurity   Worried About Programme researcher, broadcasting/film/video in the Last Year: Never true   Ran Out of Food in the Last Year: Never true  Transportation Needs: No Transportation Needs   Lack of Transportation (Medical): No   Lack of Transportation (Non-Medical): No  Physical Activity: Inactive   Days of Exercise per Week: 0 days   Minutes of Exercise per Session: 0 min  Stress: No Stress Concern Present   Feeling of Stress : Not at all  Social Connections: Unknown   Frequency of  Communication with Friends and Family: Not on file   Frequency of Social Gatherings with Friends and Family: Not on file   Attends Religious Services: Not on file   Active Member of Clubs or Organizations: No   Attends Banker Meetings: Never   Marital Status: Never married   Diabetes: No   Diabetic?no  Interpreter Needed?: No   Activities of Daily Living    01/24/2022   11:13 AM  In your present state of health, do you have any difficulty performing  the following activities:  Hearing? 0  Vision? 0  Difficulty concentrating or making decisions? 1  Walking or climbing stairs? 1  Dressing or bathing? 1  Doing errands, shopping? 1  Preparing Food and eating ? Y  Using the Toilet? Y  In the past six months, have you accidently leaked urine? N  Do you have problems with loss of bowel control? N  Managing your Medications? Y  Managing your Finances? Y  Housekeeping or managing your Housekeeping? Y    Patient Care Team: Blane Oharaox, , MD as PCP - General (Family Medicine)     Assessment:   [facility]  Hearing/Vision screen No results found.  Dietary issues and exercise activities discussed: Current Exercise Habits: The patient does not participate in regular exercise at present, Exercise limited by: neurologic condition(s)   Depression Screen    01/24/2022   11:13 AM 01/02/2021    9:28 AM 01/02/2021    9:27 AM  PHQ 2/9 Scores  PHQ - 2 Score 0 0 0    Fall Risk    01/24/2022   11:12 AM 01/02/2021    9:27 AM  Fall Risk   Falls in the past year? 1 0  Number falls in past yr: 0 0  Injury with Fall? 1 0  Risk for fall due to : Impaired mobility Impaired mobility  Follow up Falls evaluation completed Falls evaluation completed    FALL RISK PREVENTION PERTAINING TO THE HOME:  Any stairs in or around the home? No  If so, are there any without handrails? No  Home free of loose throw rugs in walkways, pet beds,  electrical cords, etc? Yes  Adequate lighting in your home to reduce risk of falls? Yes   ASSISTIVE DEVICES UTILIZED TO PREVENT FALLS:  Life alert? No  Use of a cane, walker or w/c? Yes  Grab bars in the bathroom? Yes  Shower chair or bench in shower? Yes  Elevated toilet seat or a handicapped toilet? No   TIMED UP AND GO: Unable to do. Wheelchair bound.  Cognitive Function: Patient seems to understand questions. His speech is distorted, but is able to express himself with difficulty.      Immunizations Immunization History  Administered Date(s) Administered   Influenza Inj Mdck Quad Pf 10/10/2021   Influenza-Unspecified 09/25/2013, 10/01/2018   Moderna SARS-COV2 Booster Vaccination 01/25/2021   Moderna Sars-Covid-2 Vaccination 12/17/2019, 01/13/2020    TDAP status: Due, Education has been provided regarding the importance of this vaccine. Advised may receive this vaccine at local pharmacy or Health Dept. Aware to provide a copy of the vaccination record if obtained from local pharmacy or Health Dept. Verbalized acceptance and understanding.  Flu Vaccine status: Up to date  Pneumococcal vaccine status: Declined,  Education has been provided regarding the importance of this vaccine but patient still declined. Advised may receive this vaccine at local pharmacy or Health Dept. Aware to provide a copy of the vaccination record if obtained from local pharmacy or Health Dept. Verbalized acceptance and understanding.   Covid-19 vaccine status: Completed vaccines  Qualifies for Shingles Vaccine? No   Zostavax completed No   Shingrix Completed?: No.    Education has been provided regarding the importance of this vaccine. Patient has been advised to call insurance company to determine out of pocket expense if they have not yet received this vaccine. Advised may also receive vaccine at local pharmacy or Health Dept. Verbalized acceptance and understanding.  Screening Tests  Health  Maintenance  Topic Date Due   TETANUS/TDAP  Never done   COLONOSCOPY (Pts 45-17yrs Insurance coverage will need to be confirmed)  Never done   COVID-19 Vaccine (3 - Booster for Moderna series) 03/22/2021   INFLUENZA VACCINE  05/01/2022   HPV VACCINES  Aged Out    Health Maintenance  Health Maintenance Due  Topic Date Due   TETANUS/TDAP  Never done   COLONOSCOPY (Pts 45-26yrs Insurance coverage will need to be confirmed)  Never done   COVID-19 Vaccine (3 - Booster for Moderna series) 03/22/2021    Colorectal cancer screening was not discussed. Will discuss at next visit.   Lung Cancer Screening: (Low Dose CT Chest recommended if Age 59-80 years, 30 pack-year currently smoking OR have quit w/in 15years.) does not qualify.    Additional Screening:  Hepatitis C Screening: does not qualify; Completed   Vision Screening: Recommended annual ophthalmology exams for early detection of glaucoma and other disorders of the eye. Is the patient up to date with their annual eye exam?  Yes  Who is the provider or what is the name of the office in which the patient attends annual eye exams? Eye Dr at [facility] If pt is not established with a provider, would they like to be referred to a provider to establish care? No .   Dental Screening: Recommended annual dental exams for proper oral hygiene  Community Resource Referral / Chronic Care Management: CRR required this visit?  No   CCM required this visit?  No      Plan:     I have personally reviewed and noted the following in the patient's chart:   Medical and social history Use of alcohol, tobacco or illicit drugs  Current medications and supplements including opioid prescriptions. Patient is not currently taking opioid prescriptions. Functional ability and status: limited due to CP  Nutritional status: malnourished due to medical conditions. Physical activity: limited due to spastisity Advanced directives requested they  bring a copy.  List of other physicians: none Hospitalizations, surgeries, and ER visits in previous 12 months: none Vitals - unable to do ht or wt.  Screenings to include cognitive, depression, and falls: limited, but completed.  Referrals and appointments  In addition, I have reviewed and discussed with patient certain preventive protocols, quality metrics, and best practice recommendations. A written personalized care plan for preventive services as well as general preventive health recommendations were provided to patient.   Problem List Items Addressed This Visit       Nervous and Auditory   Chronic static encephalopathy    Follows with Dr. Dominga Ferry annually at [facility].        Hydrocephalus in diseases classified elsewhere [facility])    Follows with Dr. Dominga Ferry [facility] at [facility]. S/P numerous stents.         Spastic quadriplegic cerebral palsy (HCC)    Increase baclofen to 20 mg four times a day  ( you can take 10 mg 2 pills four times a day until gone. )        Other   Encounter for Medicare annual wellness exam - Primary    Encouraged to eat 3 meals per day.        Helicobacter pylori (H. pylori) infection    Continue omeprazole 40 mg twice daily x 60 days.  Completed antibiotics.  Further work up for epigastric tenderness above.        Epigastric abdominal pain    Ordering ct  of abdomen/pelvis with contrast for this week.  Ordered UA      Relevant Orders   CT ABDOMEN PELVIS W CONTRAST (Completed)   POCT URINALYSIS DIP (CLINITEK) (Completed)   Moderate protein-calorie malnutrition (HCC)     Blane Ohara, MD   02/11/2022

## 2022-01-24 NOTE — Patient Instructions (Addendum)
Increase baclofen to 20 mg four times a day  ( you can take 10 mg 2 pills four times a day until gone. ) ? ?Ordering ct of abdomen/pelvis with contrast for this week.  ?  ? ?

## 2022-01-25 LAB — POCT URINALYSIS DIP (CLINITEK)
Bilirubin, UA: NEGATIVE
Blood, UA: NEGATIVE
Glucose, UA: NEGATIVE mg/dL
Ketones, POC UA: NEGATIVE mg/dL
Leukocytes, UA: NEGATIVE
Nitrite, UA: NEGATIVE
POC PROTEIN,UA: NEGATIVE
Spec Grav, UA: 1.02 (ref 1.010–1.025)
Urobilinogen, UA: 0.2 E.U./dL
pH, UA: 6.5 (ref 5.0–8.0)

## 2022-01-29 ENCOUNTER — Encounter (HOSPITAL_BASED_OUTPATIENT_CLINIC_OR_DEPARTMENT_OTHER): Payer: Self-pay

## 2022-01-29 ENCOUNTER — Ambulatory Visit (HOSPITAL_BASED_OUTPATIENT_CLINIC_OR_DEPARTMENT_OTHER)
Admission: RE | Admit: 2022-01-29 | Discharge: 2022-01-29 | Disposition: A | Discharge status: Home / Self Care | Payer: Medicare Other | Source: Ambulatory Visit | Attending: Family Medicine | Admitting: Family Medicine

## 2022-01-29 ENCOUNTER — Other Ambulatory Visit (HOSPITAL_BASED_OUTPATIENT_CLINIC_OR_DEPARTMENT_OTHER): Payer: Medicare Other

## 2022-01-29 ENCOUNTER — Ambulatory Visit (HOSPITAL_BASED_OUTPATIENT_CLINIC_OR_DEPARTMENT_OTHER): Admission: RE | Admit: 2022-01-29 | Payer: Medicare Other | Source: Ambulatory Visit

## 2022-01-29 DIAGNOSIS — N281 Cyst of kidney, acquired: Secondary | ICD-10-CM | POA: Diagnosis not present

## 2022-01-29 DIAGNOSIS — N400 Enlarged prostate without lower urinary tract symptoms: Secondary | ICD-10-CM | POA: Diagnosis not present

## 2022-01-29 DIAGNOSIS — R1013 Epigastric pain: Secondary | ICD-10-CM | POA: Diagnosis not present

## 2022-01-29 DIAGNOSIS — K838 Other specified diseases of biliary tract: Secondary | ICD-10-CM | POA: Diagnosis not present

## 2022-01-29 MED ORDER — IOHEXOL 300 MG/ML  SOLN
100.0000 mL | Freq: Once | INTRAMUSCULAR | Status: AC | PRN
  Administered 2022-01-29: 100 mL via INTRAVENOUS

## 2022-01-30 ENCOUNTER — Other Ambulatory Visit: Payer: Self-pay

## 2022-01-30 MED ORDER — CHLORHEXIDINE GLUCONATE 0.12 % MT SOLN: 15.0000 mL | Freq: Two times a day (BID) | OROMUCOSAL | 5 refills | Status: AC

## 2022-01-31 ENCOUNTER — Other Ambulatory Visit: Payer: Self-pay | Admitting: Family Medicine

## 2022-01-31 ENCOUNTER — Other Ambulatory Visit: Payer: Self-pay

## 2022-01-31 DIAGNOSIS — R1909 Other intra-abdominal and pelvic swelling, mass and lump: Secondary | ICD-10-CM

## 2022-02-02 ENCOUNTER — Ambulatory Visit (HOSPITAL_BASED_OUTPATIENT_CLINIC_OR_DEPARTMENT_OTHER)
Admission: RE | Admit: 2022-02-02 | Discharge: 2022-02-02 | Disposition: A | Discharge status: Home / Self Care | Payer: Medicare Other | Source: Ambulatory Visit | Attending: Family Medicine | Admitting: Family Medicine

## 2022-02-02 DIAGNOSIS — N442 Benign cyst of testis: Secondary | ICD-10-CM | POA: Diagnosis not present

## 2022-02-02 DIAGNOSIS — R1909 Other intra-abdominal and pelvic swelling, mass and lump: Secondary | ICD-10-CM

## 2022-02-08 DIAGNOSIS — R1013 Epigastric pain: Secondary | ICD-10-CM | POA: Insufficient documentation

## 2022-02-08 DIAGNOSIS — Z0000 Encounter for general adult medical examination without abnormal findings: Secondary | ICD-10-CM | POA: Insufficient documentation

## 2022-02-08 NOTE — Assessment & Plan Note (Addendum)
Ordering ct of abdomen/pelvis with contrast for this week.  ?Ordered UA ?

## 2022-02-08 NOTE — Assessment & Plan Note (Signed)
Increase baclofen to 20 mg four times a day  ( you can take 10 mg 2 pills four times a day until gone. ) ?

## 2022-02-11 DIAGNOSIS — A048 Other specified bacterial intestinal infections: Secondary | ICD-10-CM | POA: Insufficient documentation

## 2022-02-11 DIAGNOSIS — G914 Hydrocephalus in diseases classified elsewhere: Secondary | ICD-10-CM | POA: Insufficient documentation

## 2022-02-11 DIAGNOSIS — G9349 Other encephalopathy: Secondary | ICD-10-CM | POA: Insufficient documentation

## 2022-02-11 NOTE — Assessment & Plan Note (Signed)
Continue omeprazole 40 mg twice daily x 60 days.  ?Completed antibiotics.  ?Further work up for epigastric tenderness above.  ?

## 2022-02-11 NOTE — Assessment & Plan Note (Signed)
Encouraged to eat 3 meals per day.  ?

## 2022-02-11 NOTE — Assessment & Plan Note (Addendum)
Follows with Dr. Dominga Ferry annually at [facility].  ?

## 2022-02-11 NOTE — Assessment & Plan Note (Signed)
Follows with Dr. Dominga Ferry annually at [facility]. S/P numerous stents.  ? ?

## 2022-02-15 ENCOUNTER — Ambulatory Visit: Payer: Medicare Other | Admitting: Family Medicine

## 2022-02-27 MED ORDER — TRAZODONE HCL 50 MG PO TABS: 50.0000 mg | ORAL_TABLET | Freq: Every evening | ORAL | 3 refills | Status: DC | PRN

## 2022-02-27 NOTE — Addendum Note (Signed)
Addended byBlane Ohara on: 02/27/2022 10:28 AM   Modules accepted: Orders

## 2022-03-01 ENCOUNTER — Telehealth: Payer: Self-pay

## 2022-03-01 NOTE — Telephone Encounter (Signed)
Mother concerned as she feels trazodone is not helping. It was prescribed on 5/30. It helped the first night some but he woke at 3:30 am. The second night he did not go to sleep until 5 am. He did have a nap yesterday evening for about an hour. She has been giving him the trazodone around 11 pm. Please advise.   Callback: [id number] Harrell Lark 03/01/22 8:36 AM

## 2022-03-01 NOTE — Telephone Encounter (Signed)
Spoke with mother. She VU.   Royce Macadamia, [place] 03/01/22 11:40 AM

## 2022-03-05 ENCOUNTER — Telehealth: Payer: Self-pay

## 2022-03-05 NOTE — Telephone Encounter (Signed)
Patient's mother called stating that the patient is still not sleeping even after you had recommended that she give the patient 2 tablets before bed of the trazodone. Patient's mother stated that the past two nights she has give the patient 2 tablets at 10 PM and patient is consistently waking up at 3:00 AM after the fact and not going back to sleep. Please advise.

## 2022-03-09 ENCOUNTER — Other Ambulatory Visit: Payer: Self-pay | Admitting: Family Medicine

## 2022-03-09 ENCOUNTER — Other Ambulatory Visit: Payer: Self-pay

## 2022-03-09 MED ORDER — ESZOPICLONE 2 MG PO TABS: 2.0000 mg | ORAL_TABLET | Freq: Every evening | ORAL | 5 refills | Status: DC | PRN

## 2022-03-09 MED ORDER — ESZOPICLONE 2 MG PO TABS: 2.0000 mg | ORAL_TABLET | Freq: Every evening | ORAL | 1 refills | Status: DC | PRN

## 2022-03-09 NOTE — Telephone Encounter (Signed)
Patient's Mother was notified.  She is willing to stop the trazodone and will pick-up Lunesta 2 mg at [facility] in [place].

## 2022-03-13 ENCOUNTER — Other Ambulatory Visit: Payer: Self-pay | Admitting: Family Medicine

## 2022-03-13 ENCOUNTER — Telehealth: Payer: Self-pay

## 2022-03-13 MED ORDER — BELSOMRA 5 MG PO TABS: 5.0000 mg | ORAL_TABLET | Freq: Every evening | ORAL | 2 refills | Status: DC | PRN

## 2022-03-13 NOTE — Telephone Encounter (Signed)
Mother made aware. She VU of change in medication.  Lorita Officer, [place] 03/13/22 3:28 PM

## 2022-03-13 NOTE — Telephone Encounter (Signed)
Discussed hoyer lift order with patient's Mom.  She is going to talk with the insurance company and left [place] know where to send the prescription.

## 2022-03-13 NOTE — Telephone Encounter (Signed)
PA for eszopiclone 2 mg tablet has been denied. "The drug you asked for is not listed in you rpreferred drug list (formulary). The preferred drug(s) you have not tried, are: Belsomra tablet, and zolpidem tablet." Please advise.   Lorita Officer, [place] 03/13/22 10:38 AM

## 2022-03-16 ENCOUNTER — Telehealth: Payer: Self-pay

## 2022-03-16 NOTE — Telephone Encounter (Signed)
Bradley [name] called to report that [name] did not sleep with the Lunesta.  Dr. Sedalia Muta wants her to try the medication for 3 nights. If the medication does not work will need to increase to to 2 at bedtime.

## 2022-03-26 ENCOUNTER — Encounter: Payer: Self-pay | Admitting: Family Medicine

## 2022-03-26 ENCOUNTER — Ambulatory Visit (INDEPENDENT_AMBULATORY_CARE_PROVIDER_SITE_OTHER): Payer: Medicare Other | Admitting: Family Medicine

## 2022-03-26 VITALS — BP 110/64 | HR 96 | Temp 97.4°F | Resp 16

## 2022-03-26 DIAGNOSIS — Z872 Personal history of diseases of the skin and subcutaneous tissue: Secondary | ICD-10-CM

## 2022-03-26 DIAGNOSIS — F5101 Primary insomnia: Secondary | ICD-10-CM | POA: Diagnosis not present

## 2022-03-26 DIAGNOSIS — G800 Spastic quadriplegic cerebral palsy: Secondary | ICD-10-CM

## 2022-03-26 DIAGNOSIS — Z9189 Other specified personal risk factors, not elsewhere classified: Secondary | ICD-10-CM | POA: Diagnosis not present

## 2022-03-26 MED ORDER — BELSOMRA 15 MG PO TABS: 15.0000 mg | ORAL_TABLET | Freq: Every evening | ORAL | 1 refills | Status: DC | PRN

## 2022-03-26 NOTE — Assessment & Plan Note (Signed)
Patient to increase Belsomra to 5 mg 2 nightly.  If this helps, patient's mother to call and we will send this prescription.  Send new prescription for Belsomra 15 mg nightly anticipating the need for a higher dose.

## 2022-03-26 NOTE — Assessment & Plan Note (Signed)
Patient needs a slide board.  May benefit from a [facility] lift. Needs a personal care service (CNA) type for max amount of time allowable. I will call home health care to find out why they could do an OT evaluation for supplies that were needed at his home.

## 2022-03-26 NOTE — Progress Notes (Addendum)
Acute Office Visit  Subjective:    Patient ID: Bradley [name], male    DOB: [birth year], 46 y.o.   MRN: [id number]  Chief Complaint  Patient presents with   Otalgia    left    HPI: Patient is in today for complaints of left earache which started yesterday.  He is having no fever or chills. His Mother reports that he continues to have problems with sleeping.  He only sleeps for 2 hours at a time.  He does take naps during the day.  He has a television in his room which she will walk sometimes at night.  Patient failed on trazodone 50 to 100 mg nightly.  Patient started on Belsomra 5 mg nightly.  This has not helped.  Patient has spastic quadriplegic cerebral palsy and depends on his mother to lift him and transfer him.  She has no slide board.  Patient has a hospital bed but a normal mattress.  He has a history of decubitus ulcers but none currently.  I had ordered home health care for PT, OT, skilled nursing.  Our referral coordinator was told he did not qualify for for home therapy.  Patient needs a personal care services (CNA) to help on a daily basis up to the maximum amount they are eligible.  Patient is at aspiration risk if lays in bed flat.   Past Medical History:  Diagnosis Date   Cerebral palsy, hemiplegic (HCC)    Chronic idiopathic constipation    Seizure disorder (HCC)     Past Surgical History:  Procedure Laterality Date   ADENOIDECTOMY     cranial-peritoneal shunt     TONSILLECTOMY      Family History  Problem Relation Age of Onset   Hyperlipidemia Mother    Hypertension Mother    Diabetes Father    Migraines Other    Osteoarthritis Other    Cancer Other        Prostate and Breast   Heart attack Other     Social History   Socioeconomic History   Marital status: Single    Spouse name: Not on file   Number of children: Not on file   Years of education: Not on file   Highest education level: Not on file  Occupational History   Occupation: Disabled   Tobacco Use   Smoking status: Never   Smokeless tobacco: Never  Substance and Sexual Activity   Alcohol use: Never   Drug use: Never   Sexual activity: Never  Other Topics Concern   Not on file  Social History Narrative   Not on file   Social Determinants of Health   Financial Resource Strain: Low Risk  (01/24/2022)   Overall Financial Resource Strain (CARDIA)    Difficulty of Paying Living Expenses: Not hard at all  Food Insecurity: No Food Insecurity (01/24/2022)   Hunger Vital Sign    Worried About Running Out of Food in the Last Year: Never true    Ran Out of Food in the Last Year: Never true  Transportation Needs: No Transportation Needs (01/24/2022)   PRAPARE - Hydrologist (Medical): No    Lack of Transportation (Non-Medical): No  Physical Activity: Inactive (01/24/2022)   Exercise Vital Sign    Days of Exercise per Week: 0 days    Minutes of Exercise per Session: 0 min  Stress: No Stress Concern Present (01/24/2022)   [place]  Feeling of Stress : Not at all  Social Connections: Unknown (01/24/2022)   Social Connection and Isolation Panel [NHANES]    Frequency of Communication with Friends and Family: Not on file    Frequency of Social Gatherings with Friends and Family: Not on file    Attends Religious Services: Not on file    Active Member of Clubs or Organizations: No    Attends Archivist Meetings: Never    Marital Status: Never married  Intimate Partner Violence: Not At Risk (01/24/2022)   Humiliation, Afraid, Rape, and Kick questionnaire    Fear of Current or Ex-Partner: No    Emotionally Abused: No    Physically Abused: No    Sexually Abused: No    Outpatient Medications Prior to Visit  Medication Sig Dispense Refill   baclofen (LIORESAL) 20 MG tablet Take 1 tablet (20 mg total) by mouth 4 (four) times daily. 120 each 2   chlorhexidine (PERIDEX) 0.12  % solution Use as directed 15 mLs in the mouth or throat 2 (two) times daily. 120 mL 5   omeprazole (PRILOSEC) 40 MG capsule Take 1 capsule (40 mg total) by mouth in the morning and at bedtime. 60 capsule 1   tamsulosin (FLOMAX) 0.4 MG CAPS capsule Take 1 capsule (0.4 mg total) by mouth daily after supper. 30 capsule 3   Suvorexant (BELSOMRA) 5 MG TABS Take 5 mg by mouth at bedtime as needed. 30 tablet 2   No facility-administered medications prior to visit.    Allergies  Allergen Reactions   Morphine Other (See Comments)    Other reaction(s): Unknown Had a hard time waking up after surgery. Had a hard time waking up after surgery.     Review of Systems  Constitutional:  Negative for chills and fever.  HENT:  Positive for ear pain and rhinorrhea. Negative for ear discharge.   Neurological:  Positive for headaches.       Objective:    Physical Exam Vitals reviewed.  HENT:     Right Ear: Tympanic membrane normal.     Left Ear: Tympanic membrane normal.  Cardiovascular:     Rate and Rhythm: Normal rate and regular rhythm.     Heart sounds: Normal heart sounds.  Pulmonary:     Effort: Pulmonary effort is normal.     Breath sounds: Normal breath sounds.  Abdominal:     Palpations: Abdomen is soft.     Tenderness: There is no abdominal tenderness.  Musculoskeletal:     Comments: Contractures of all extremities.  Neurological:     Mental Status: He is alert.    BP 110/64   Pulse 96   Temp (!) 97.4 F (36.3 C)   Resp 16  Wt Readings from Last 3 Encounters:  No data found for Wt    Health Maintenance Due  Topic Date Due   TETANUS/TDAP  Never done   COLONOSCOPY (Pts 45-13yr Insurance coverage will need to be confirmed)  Never done   COVID-19 Vaccine (3 - Moderna series) 03/22/2021    There are no preventive care reminders to display for this patient.   Lab Results  Component Value Date   TSH 0.373 (L) 01/02/2021   Lab Results  Component Value Date   WBC  4.7 12/27/2021   HGB 13.7 12/27/2021   HCT 38.7 12/27/2021   MCV 89 12/27/2021   PLT 463 (H) 12/27/2021   Lab Results  Component Value Date   NA 135 12/27/2021   K  4.1 12/27/2021   CO2 22 12/27/2021   GLUCOSE 87 12/27/2021   BUN 6 12/27/2021   CREATININE 0.45 (L) 12/27/2021   BILITOT 0.5 12/27/2021   ALKPHOS 114 12/27/2021   AST 15 12/27/2021   ALT 21 12/27/2021   PROT 6.8 12/27/2021   ALBUMIN 4.5 12/27/2021   CALCIUM 9.7 12/27/2021   EGFR 131 12/27/2021   Lab Results  Component Value Date   CHOL 166 07/12/2021   Lab Results  Component Value Date   HDL 64 07/12/2021   Lab Results  Component Value Date   LDLCALC 92 07/12/2021   Lab Results  Component Value Date   TRIG 47 07/12/2021   Lab Results  Component Value Date   CHOLHDL 2.6 07/12/2021   No results found for: "HGBA1C"     Assessment & Plan:   Problem List Items Addressed This Visit       Nervous and Auditory   Spastic quadriplegic cerebral palsy ([place]) - Primary    Patient needs a slide board.  May benefit from a [facility] lift. Needs a personal care service (CNA) type for max amount of time allowable. I will call home health care to find out why they could do an OT evaluation for supplies that were needed at his home.        Other   At high risk for aspiration    Requires/benefits from head of bed raised 30 degrees.       Primary insomnia    Patient to increase Belsomra to 5 mg 2 nightly.  If this helps, patient's mother to call and we will send this prescription.  Send new prescription for Belsomra 15 mg nightly anticipating the need for a higher dose.       History of decubitus ulcer    Order air mattress gel overlay.       Meds ordered this encounter  Medications   DISCONTD: Suvorexant (BELSOMRA) 15 MG TABS    Sig: Take 15 mg by mouth at bedtime as needed.    Dispense:  30 tablet    Refill:  1    Follow-up: Return in about 6 weeks (around 05/07/2022).  An After Visit Summary was  printed and given to the patient.  Rochel Brome, MD  Family Practice [phone]

## 2022-04-02 ENCOUNTER — Telehealth: Payer: Self-pay

## 2022-04-02 NOTE — Telephone Encounter (Signed)
[facility] Nurse) called about patient needs an order faxed to [facility] for [facility] services, she stated that patient also needs order  for an lift and hospital bed. Pleas advice

## 2022-04-04 ENCOUNTER — Telehealth: Payer: Self-pay

## 2022-04-04 NOTE — Telephone Encounter (Signed)
Patient wife called and stated he has been taking Belsomra 15 mg but is still not being able to sleep and wanted to know if there is something else they can try. Please advise.

## 2022-04-05 ENCOUNTER — Other Ambulatory Visit: Payer: Self-pay | Admitting: Family Medicine

## 2022-04-05 MED ORDER — ZOLPIDEM TARTRATE 5 MG PO TABS: 5.0000 mg | ORAL_TABLET | Freq: Every evening | ORAL | 1 refills | Status: DC | PRN

## 2022-04-05 NOTE — Telephone Encounter (Signed)
I talked with patient's mother and I let her know.  Paper for hospital bed and mechanical lift [facility].

## 2022-04-05 NOTE — Telephone Encounter (Signed)
Done

## 2022-04-06 DIAGNOSIS — Z9189 Other specified personal risk factors, not elsewhere classified: Secondary | ICD-10-CM | POA: Insufficient documentation

## 2022-04-06 NOTE — Assessment & Plan Note (Signed)
Requires/benefits from head of bed raised 30 degrees.

## 2022-04-13 ENCOUNTER — Telehealth: Payer: Self-pay

## 2022-04-13 DIAGNOSIS — R1011 Right upper quadrant pain: Secondary | ICD-10-CM | POA: Diagnosis not present

## 2022-04-13 DIAGNOSIS — R1084 Generalized abdominal pain: Secondary | ICD-10-CM | POA: Diagnosis not present

## 2022-04-13 DIAGNOSIS — R1013 Epigastric pain: Secondary | ICD-10-CM | POA: Diagnosis not present

## 2022-04-13 DIAGNOSIS — G809 Cerebral palsy, unspecified: Secondary | ICD-10-CM | POA: Diagnosis not present

## 2022-04-13 DIAGNOSIS — R110 Nausea: Secondary | ICD-10-CM | POA: Diagnosis not present

## 2022-04-13 DIAGNOSIS — M79651 Pain in right thigh: Secondary | ICD-10-CM | POA: Diagnosis not present

## 2022-04-13 DIAGNOSIS — Z982 Presence of cerebrospinal fluid drainage device: Secondary | ICD-10-CM | POA: Diagnosis not present

## 2022-04-13 DIAGNOSIS — Z4541 Encounter for adjustment and management of cerebrospinal fluid drainage device: Secondary | ICD-10-CM | POA: Diagnosis not present

## 2022-04-13 NOTE — Telephone Encounter (Signed)
Father calling as patient has been having abd pain. Complains more at night. Denies fever, vomiting. Parents are unsure of last BM however he is typically every other day w/ 1-3 Bms in a day.  Spoke w/ Dr Sedalia Muta, she recommends parents proceed w/ patient to ED for further evaluation. Parents VU of this recommendation and will be going to [place] or [place].   Lorita Officer, CCMA 04/13/22 8:12 AM

## 2022-04-19 ENCOUNTER — Other Ambulatory Visit: Payer: Self-pay | Admitting: Family Medicine

## 2022-04-19 ENCOUNTER — Telehealth: Payer: Self-pay

## 2022-04-19 MED ORDER — GENTAMICIN SULFATE 0.3 % OP SOLN: 2.0000 [drp] | OPHTHALMIC | 0 refills | Status: AC

## 2022-04-19 NOTE — Telephone Encounter (Signed)
Patient Father called patient was seen in [facility] Friday for right side pain. He would like for Dr.Cox to give him a call back please.

## 2022-04-19 NOTE — Telephone Encounter (Signed)
Mother called back and she would like drops sent in please.

## 2022-04-19 NOTE — Telephone Encounter (Signed)
Patient scheduled for Hospital follow up

## 2022-04-19 NOTE — Telephone Encounter (Signed)
Called and left message for patient mother to call office back.

## 2022-04-19 NOTE — Progress Notes (Signed)
Subjective:  Patient ID: Bradley [name], male    DOB: 1976-01-14  Age: 46 y.o. MRN: [id number]  Chief Complaint  Patient presents with   Hospitalization Follow-up    Follow up Hospitalization  Patient was seen in the Emergency Department at [facility] on 04/13/2022. He was treated for abdominal pain, healing right anterior fourth rib fracture.   He continues to have abdominal pain, but motions to area of fractured rib. Also a little in epigastric region. Work up in the ED include lab work and a ct scan of abd/pelvis which were normal.  I had previously done lab work including an helicobacter pylori which was negative and ct scan of abd/pelvis which had showed a moderate burden of stool but otherwise normal. Patient has had his gall bladder removed.  If he sleeps in his wheelchair he seems more comfortable. He stayed at his brother home and they did this and he slept better. He came home with a wound on the back of his left lower proximal leg.  Leaking of fluid posterior left leg x 2 days. No one else in his family has had any wounds. He has contractures and his normal seated position is with his right leg crossing his left leg. No fever.     Current Outpatient Medications on File Prior to Visit  Medication Sig Dispense Refill   baclofen (LIORESAL) 20 MG tablet Take 1 tablet (20 mg total) by mouth 4 (four) times daily. 120 each 2   chlorhexidine (PERIDEX) 0.12 % solution Use as directed 15 mLs in the mouth or throat 2 (two) times daily. 120 mL 5   omeprazole (PRILOSEC) 40 MG capsule Take 1 capsule (40 mg total) by mouth in the morning and at bedtime. 60 capsule 1   tamsulosin (FLOMAX) 0.4 MG CAPS capsule Take 1 capsule (0.4 mg total) by mouth daily after supper. 30 capsule 3   zolpidem (AMBIEN) 5 MG tablet Take 1 tablet (5 mg total) by mouth at bedtime as needed for sleep. 30 tablet 1   No current facility-administered medications on file prior to visit.   Past Medical History:  Diagnosis  Date   Cerebral palsy, hemiplegic (HCC)    Chronic idiopathic constipation    Seizure disorder (HCC)    Past Surgical History:  Procedure Laterality Date   ADENOIDECTOMY     cranial-peritoneal shunt     TONSILLECTOMY      Family History  Problem Relation Age of Onset   Hyperlipidemia Mother    Hypertension Mother    Diabetes Father    Migraines Other    Osteoarthritis Other    Cancer Other        Prostate and Breast   Heart attack Other    Social History   Socioeconomic History   Marital status: Single    Spouse name: Not on file   Number of children: Not on file   Years of education: Not on file   Highest education level: Not on file  Occupational History   Occupation: Disabled  Tobacco Use   Smoking status: Never   Smokeless tobacco: Never  Substance and Sexual Activity   Alcohol use: Never   Drug use: Never   Sexual activity: Never  Other Topics Concern   Not on file  Social History Narrative   Not on file   Social Determinants of Health   Financial Resource Strain: Low Risk  (01/24/2022)   Overall Financial Resource Strain (CARDIA)    Difficulty of Paying Living Expenses:  Not hard at all  Food Insecurity: No Food Insecurity (01/24/2022)   Hunger Vital Sign    Worried About Running Out of Food in the Last Year: Never true    Ran Out of Food in the Last Year: Never true  Transportation Needs: No Transportation Needs (01/24/2022)   PRAPARE - Administrator, Civil Service (Medical): No    Lack of Transportation (Non-Medical): No  Physical Activity: Inactive (01/24/2022)   Exercise Vital Sign    Days of Exercise per Week: 0 days    Minutes of Exercise per Session: 0 min  Stress: No Stress Concern Present (01/24/2022)   [employer] of Occupational Health - Occupational Stress Questionnaire    Feeling of Stress : Not at all  Social Connections: Unknown (01/24/2022)   Social Connection and Isolation Panel [NHANES]    Frequency of Communication  with Friends and Family: Not on file    Frequency of Social Gatherings with Friends and Family: Not on file    Attends Religious Services: Not on file    Active Member of Clubs or Organizations: No    Attends Banker Meetings: Never    Marital Status: Never married    Review of Systems  Constitutional:  Negative for appetite change, fatigue and fever.  HENT:  Positive for congestion. Negative for ear pain, sinus pressure and sore throat.   Respiratory:  Negative for cough, chest tightness, shortness of breath and wheezing.   Cardiovascular:  Negative for chest pain and palpitations.  Gastrointestinal:  Positive for abdominal pain and constipation. Negative for diarrhea, nausea and vomiting.  Genitourinary:  Positive for frequency. Negative for dysuria and hematuria.  Musculoskeletal:  Negative for arthralgias, back pain, joint swelling and myalgias.  Skin:  Negative for rash.  Neurological:  Negative for dizziness, weakness and headaches.  Psychiatric/Behavioral:  Positive for sleep disturbance. Negative for dysphoric mood. The patient is not nervous/anxious.      Objective:  BP 110/60   Pulse 68   Temp 97.9 F (36.6 C)   Resp 16      04/25/2022    9:50 AM 04/20/2022   10:52 AM 03/26/2022   11:38 AM  BP/Weight  Systolic BP 112 110 110  Diastolic BP 64 60 64    Physical Exam Vitals reviewed.  Constitutional:      Appearance: Normal appearance.     Comments: thin  Cardiovascular:     Rate and Rhythm: Normal rate and regular rhythm.     Heart sounds: Normal heart sounds.  Pulmonary:     Effort: Pulmonary effort is normal.     Breath sounds: Normal breath sounds.     Comments: Tender over anterior right rib cage.  Abdominal:     General: Abdomen is flat. Bowel sounds are normal.     Palpations: Abdomen is soft.     Tenderness: There is no abdominal tenderness.  Skin:    Findings: Lesion (posterior left leg size of baseball. skin appers to have been rubbed  off (similar to that of a carpet burn.) small amount of thin yellowish drainage.) present.  Neurological:     Mental Status: He is alert and oriented to person, place, and time.  Psychiatric:        Mood and Affect: Mood normal.        Behavior: Behavior normal.     Diabetic Foot Exam - Simple   No data filed      Lab Results  Component Value Date  WBC 4.7 12/27/2021   HGB 13.7 12/27/2021   HCT 38.7 12/27/2021   PLT 463 (H) 12/27/2021   GLUCOSE 87 12/27/2021   CHOL 166 07/12/2021   TRIG 47 07/12/2021   HDL 64 07/12/2021   LDLCALC 92 07/12/2021   ALT 21 12/27/2021   AST 15 12/27/2021   NA 135 12/27/2021   K 4.1 12/27/2021   CL 97 12/27/2021   CREATININE 0.45 (L) 12/27/2021   BUN 6 12/27/2021   CO2 22 12/27/2021   TSH 0.373 (L) 01/02/2021      Assessment & Plan:   Problem List Items Addressed This Visit       Musculoskeletal and Integument   Spontaneous fracture    Order bone density      Relevant Orders   DG Bone Density   Abrasion of left leg    Dressing applied.  Recommended they change the dressing once a day after cleaning it with soapy water.      Fracture of one rib, right side, initial encounter for closed fracture    Recommended trying to have the patient sleep on his left side with a pillow behind his back.  I would prefer he not sleep in his wheelchair as this could result in a decubitus ulcer.      Relevant Orders   DG Bone Density   Other specified disorders of bone density and structure, other site    Patient is at very high risk for osteoporosis due to chronic contractures from his cerebral palsy.  Ordering a bone density.      Relevant Orders   DG Bone Density     Other   Abdominal pain, generalized - Primary    Refer to gastroenterology     . Orders Placed This Encounter  Procedures   DG Bone Density     Follow-up: Return in about 6 weeks (around 06/01/2022).  An After Visit Summary was printed and given to the  patient.  Rochel Brome, MD  Family Practice [phone]

## 2022-04-19 NOTE — Telephone Encounter (Signed)
Sent. Dr.   

## 2022-04-19 NOTE — Telephone Encounter (Signed)
Mother calling as patient's eyes are red and producing discharge. One eye is worse than the other. He stayed with other family last night and when he returned mother found this. She has tried wiping with warm water. She is unsure what can be done to help this. She has given allergy medication.   Please advise [name] 04/19/22 2:12 PM

## 2022-04-20 ENCOUNTER — Ambulatory Visit (INDEPENDENT_AMBULATORY_CARE_PROVIDER_SITE_OTHER): Payer: Medicare Other | Admitting: Family Medicine

## 2022-04-20 VITALS — BP 110/60 | HR 68 | Temp 97.9°F | Resp 16

## 2022-04-20 DIAGNOSIS — S80812A Abrasion, left lower leg, initial encounter: Secondary | ICD-10-CM | POA: Diagnosis not present

## 2022-04-20 DIAGNOSIS — M8448XA Pathological fracture, other site, initial encounter for fracture: Secondary | ICD-10-CM

## 2022-04-20 DIAGNOSIS — M8588 Other specified disorders of bone density and structure, other site: Secondary | ICD-10-CM | POA: Diagnosis not present

## 2022-04-20 DIAGNOSIS — S2231XA Fracture of one rib, right side, initial encounter for closed fracture: Secondary | ICD-10-CM

## 2022-04-20 DIAGNOSIS — M8440XA Pathological fracture, unspecified site, initial encounter for fracture: Secondary | ICD-10-CM | POA: Insufficient documentation

## 2022-04-20 DIAGNOSIS — M8000XA Age-related osteoporosis with current pathological fracture, unspecified site, initial encounter for fracture: Secondary | ICD-10-CM

## 2022-04-20 DIAGNOSIS — R1084 Generalized abdominal pain: Secondary | ICD-10-CM | POA: Diagnosis not present

## 2022-04-24 ENCOUNTER — Telehealth: Payer: Self-pay

## 2022-04-24 NOTE — Telephone Encounter (Signed)
Mrs. Snelson called with concerns that [place] continues to have leaking from his leg and the area is red and raw.  He also continues to complain of abdominal pain.  No fever or chills reported.  Follow-up appointment scheduled for tomorrow morning.

## 2022-04-25 ENCOUNTER — Ambulatory Visit (INDEPENDENT_AMBULATORY_CARE_PROVIDER_SITE_OTHER): Payer: Medicare Other | Admitting: Family Medicine

## 2022-04-25 ENCOUNTER — Encounter: Payer: Self-pay | Admitting: Gastroenterology

## 2022-04-25 ENCOUNTER — Encounter: Payer: Self-pay | Admitting: Family Medicine

## 2022-04-25 VITALS — BP 112/64 | HR 77 | Resp 16

## 2022-04-25 DIAGNOSIS — S81802D Unspecified open wound, left lower leg, subsequent encounter: Secondary | ICD-10-CM

## 2022-04-25 DIAGNOSIS — S81802A Unspecified open wound, left lower leg, initial encounter: Secondary | ICD-10-CM | POA: Diagnosis not present

## 2022-04-25 DIAGNOSIS — R1013 Epigastric pain: Secondary | ICD-10-CM | POA: Diagnosis not present

## 2022-04-25 MED ORDER — CEPHALEXIN 500 MG PO CAPS: 500.0000 mg | ORAL_CAPSULE | Freq: Two times a day (BID) | ORAL | 0 refills | Status: DC

## 2022-04-25 NOTE — Assessment & Plan Note (Signed)
GI referral sent

## 2022-04-25 NOTE — Progress Notes (Signed)
Acute Office Visit  Subjective:    Patient ID: Bradley [name], male    DOB: 08-Apr-1976, 46 y.o.   MRN: [id number]  Chief Complaint  Patient presents with   Abdominal Pain    HPI: Patient is a 46 year old white male with cerebral palsy who is in today for continued abdominal pain and leg oozing. Mother states pain is about the same. Wound on posterior left leg that began last week and is now draining. No fever.  Abdominal pain very thorough work-up.  Has had 2 CT scans in the last 3 months both of which were normal other than a large amount of stool on the first 1.  Patient is having bowel movements every 3 to 2 to 3 days.  Patient will have the pain and will be just severe and making a lot of noises.  This pain can wake him from sleep.  We did discuss a GI referral and will proceed with this.  Past Medical History:  Diagnosis Date   Cerebral palsy, hemiplegic (HCC)    Chronic idiopathic constipation    Seizure disorder (HCC)     Past Surgical History:  Procedure Laterality Date   ADENOIDECTOMY     cranial-peritoneal shunt     TONSILLECTOMY      Family History  Problem Relation Age of Onset   Hyperlipidemia Mother    Hypertension Mother    Diabetes Father    Migraines Other    Osteoarthritis Other    Cancer Other        Prostate and Breast   Heart attack Other     Social History   Socioeconomic History   Marital status: Single    Spouse name: Not on file   Number of children: Not on file   Years of education: Not on file   Highest education level: Not on file  Occupational History   Occupation: Disabled  Tobacco Use   Smoking status: Never   Smokeless tobacco: Never  Substance and Sexual Activity   Alcohol use: Never   Drug use: Never   Sexual activity: Never  Other Topics Concern   Not on file  Social History Narrative   Not on file   Social Determinants of Health   Financial Resource Strain: Low Risk  (01/24/2022)   Overall Financial Resource  Strain (CARDIA)    Difficulty of Paying Living Expenses: Not hard at all  Food Insecurity: No Food Insecurity (01/24/2022)   Hunger Vital Sign    Worried About Running Out of Food in the Last Year: Never true    Ran Out of Food in the Last Year: Never true  Transportation Needs: No Transportation Needs (01/24/2022)   PRAPARE - Hydrologist (Medical): No    Lack of Transportation (Non-Medical): No  Physical Activity: Inactive (01/24/2022)   Exercise Vital Sign    Days of Exercise per Week: 0 days    Minutes of Exercise per Session: 0 min  Stress: No Stress Concern Present (01/24/2022)   [place]    Feeling of Stress : Not at all  Social Connections: Unknown (01/24/2022)   Social Connection and Isolation Panel [NHANES]    Frequency of Communication with Friends and Family: Not on file    Frequency of Social Gatherings with Friends and Family: Not on file    Attends Religious Services: Not on file    Active Member of Clubs or Organizations: No  Attends Archivist Meetings: Never    Marital Status: Never married  Intimate Partner Violence: Not At Risk (01/24/2022)   Humiliation, Afraid, Rape, and Kick questionnaire    Fear of Current or Ex-Partner: No    Emotionally Abused: No    Physically Abused: No    Sexually Abused: No    Outpatient Medications Prior to Visit  Medication Sig Dispense Refill   baclofen (LIORESAL) 20 MG tablet Take 1 tablet (20 mg total) by mouth 4 (four) times daily. 120 each 2   chlorhexidine (PERIDEX) 0.12 % solution Use as directed 15 mLs in the mouth or throat 2 (two) times daily. 120 mL 5   omeprazole (PRILOSEC) 40 MG capsule Take 1 capsule (40 mg total) by mouth in the morning and at bedtime. 60 capsule 1   tamsulosin (FLOMAX) 0.4 MG CAPS capsule Take 1 capsule (0.4 mg total) by mouth daily after supper. 30 capsule 3   zolpidem (AMBIEN) 5 MG tablet Take 1  tablet (5 mg total) by mouth at bedtime as needed for sleep. 30 tablet 1   No facility-administered medications prior to visit.    Allergies  Allergen Reactions   Morphine Other (See Comments)    Other reaction(s): Unknown Had a hard time waking up after surgery. Had a hard time waking up after surgery.     Review of Systems  Constitutional:  Negative for appetite change, fatigue and fever.  HENT:  Negative for congestion, ear pain, sinus pressure and sore throat.   Respiratory:  Negative for cough, shortness of breath and wheezing.   Cardiovascular:  Negative for chest pain and palpitations.  Gastrointestinal:  Positive for abdominal pain. Negative for constipation, diarrhea, nausea and vomiting.  Genitourinary:  Negative for dysuria and frequency.  Musculoskeletal:  Negative for arthralgias, back pain, joint swelling and myalgias.  Skin:  Positive for wound.  Neurological:  Negative for dizziness, weakness and headaches.  Psychiatric/Behavioral:  Negative for dysphoric mood. The patient is not nervous/anxious.        Objective:    Physical Exam Vitals reviewed.  Constitutional:      Appearance: Normal appearance.     Comments: Patient is in a wheelchair.  He sits stooped and has contractures of all of his extremities.  He frequently sits with his right leg crossing over his left.  HENT:     Right Ear: Tympanic membrane normal.     Left Ear: Tympanic membrane normal.     Nose: Nose normal.  Cardiovascular:     Pulses: Normal pulses.     Heart sounds: Normal heart sounds.  Pulmonary:     Effort: Pulmonary effort is normal.     Breath sounds: Normal breath sounds.  Abdominal:     General: Bowel sounds are normal. There is no distension or abdominal bruit.     Palpations: Abdomen is soft.  Skin:         Comments: Stage II ulcer/wound posterior left leg.  Dressing placed.  Nonstick and Coban applied after antibiotic ointment.  Neurological:     Mental Status: He is  alert and oriented to person, place, and time. Mental status is at baseline.  Psychiatric:        Mood and Affect: Mood normal.        Behavior: Behavior normal.     BP 112/64   Pulse 77   Resp 16   SpO2 98%  Wt Readings from Last 3 Encounters:  No data found for [employer]  Health Maintenance Due  Topic Date Due   TETANUS/TDAP  Never done   COLONOSCOPY (Pts 45-82yr Insurance coverage will need to be confirmed)  Never done   COVID-19 Vaccine (3 - Moderna series) 03/22/2021    There are no preventive care reminders to display for this patient.   Lab Results  Component Value Date   TSH 0.373 (L) 01/02/2021   Lab Results  Component Value Date   WBC 4.7 12/27/2021   HGB 13.7 12/27/2021   HCT 38.7 12/27/2021   MCV 89 12/27/2021   PLT 463 (H) 12/27/2021   Lab Results  Component Value Date   NA 135 12/27/2021   K 4.1 12/27/2021   CO2 22 12/27/2021   GLUCOSE 87 12/27/2021   BUN 6 12/27/2021   CREATININE 0.45 (L) 12/27/2021   BILITOT 0.5 12/27/2021   ALKPHOS 114 12/27/2021   AST 15 12/27/2021   ALT 21 12/27/2021   PROT 6.8 12/27/2021   ALBUMIN 4.5 12/27/2021   CALCIUM 9.7 12/27/2021   EGFR 131 12/27/2021   Lab Results  Component Value Date   CHOL 166 07/12/2021   Lab Results  Component Value Date   HDL 64 07/12/2021   Lab Results  Component Value Date   LDLCALC 92 07/12/2021   Lab Results  Component Value Date   TRIG 47 07/12/2021   Lab Results  Component Value Date   CHOLHDL 2.6 07/12/2021   No results found for: "HGBA1C"     Assessment & Plan:   Problem List Items Addressed This Visit       Other   Epigastric abdominal pain - Primary    GI referral sent.      Relevant Orders   Ambulatory referral to Gastroenterology   Leg wound, left, subsequent encounter    Dressing was applied. I did find some Xeroform gauze and Tegaderm which I gave to his mother.  Explained how to use this.  I was able to get [facility]  I did order home health care nursing and case he needed wound care at home as well.  If wound care does not feel this is necessary I would cancel.      Meds ordered this encounter  Medications   cephALEXin (KEFLEX) 500 MG capsule    Sig: Take 1 capsule (500 mg total) by mouth 2 (two) times daily.    Dispense:  14 capsule    Refill:  0    Orders Placed This Encounter  Procedures   Ambulatory referral to Gastroenterology   [facility]    Total time spent on today's visit was greater than 30 minutes, including both face-to-face time and nonface-to-face time personally spent on review of chart (labs and imaging), discussing labs and goals, discussing further work-up, treatment options, referrals to specialist if needed, reviewing outside records of pertinent, answering patient's questions, and coordinating care.  Follow-up: Return in about 6 weeks (around 06/05/2022).  An After Visit Summary was printed and given to the patient.  KRochel Brome MD  Family Practice [phone]

## 2022-04-25 NOTE — Assessment & Plan Note (Signed)
Dressing was applied. I did find some Xeroform gauze and Tegaderm which I gave to his mother.  Explained how to use this.  I was able to get him in with the wound care center at [facility].  I did order home health care nursing and case he needed wound care at home as well.  If wound care does not feel this is necessary I would cancel.

## 2022-04-26 DIAGNOSIS — X58XXXA Exposure to other specified factors, initial encounter: Secondary | ICD-10-CM | POA: Diagnosis not present

## 2022-04-26 DIAGNOSIS — B379 Candidiasis, unspecified: Secondary | ICD-10-CM | POA: Diagnosis not present

## 2022-04-26 DIAGNOSIS — S81802A Unspecified open wound, left lower leg, initial encounter: Secondary | ICD-10-CM | POA: Diagnosis not present

## 2022-04-29 ENCOUNTER — Encounter: Payer: Self-pay | Admitting: Family Medicine

## 2022-04-29 DIAGNOSIS — M8588 Other specified disorders of bone density and structure, other site: Secondary | ICD-10-CM | POA: Insufficient documentation

## 2022-04-29 DIAGNOSIS — S80812A Abrasion, left lower leg, initial encounter: Secondary | ICD-10-CM | POA: Insufficient documentation

## 2022-04-29 DIAGNOSIS — S2231XA Fracture of one rib, right side, initial encounter for closed fracture: Secondary | ICD-10-CM | POA: Insufficient documentation

## 2022-04-29 NOTE — Assessment & Plan Note (Signed)
Order bone density.  ° °

## 2022-04-29 NOTE — Assessment & Plan Note (Signed)
Dressing applied.  Recommended they change the dressing once a day after cleaning it with soapy water.

## 2022-04-29 NOTE — Assessment & Plan Note (Signed)
Refer to gastroenterology.

## 2022-04-29 NOTE — Assessment & Plan Note (Signed)
Patient is at very high risk for osteoporosis due to chronic contractures from his cerebral palsy.  Ordering a bone density.

## 2022-04-29 NOTE — Assessment & Plan Note (Signed)
Recommended trying to have the patient sleep on his left side with a pillow behind his back.  I would prefer he not sleep in his wheelchair as this could result in a decubitus ulcer.

## 2022-05-03 DIAGNOSIS — R21 Rash and other nonspecific skin eruption: Secondary | ICD-10-CM | POA: Diagnosis not present

## 2022-05-09 ENCOUNTER — Telehealth: Payer: Self-pay

## 2022-05-09 ENCOUNTER — Encounter: Payer: Self-pay | Admitting: Gastroenterology

## 2022-05-09 ENCOUNTER — Ambulatory Visit (INDEPENDENT_AMBULATORY_CARE_PROVIDER_SITE_OTHER): Payer: Medicare Other | Admitting: Gastroenterology

## 2022-05-09 VITALS — BP 114/70 | HR 86

## 2022-05-09 DIAGNOSIS — R109 Unspecified abdominal pain: Secondary | ICD-10-CM | POA: Diagnosis not present

## 2022-05-09 DIAGNOSIS — K5909 Other constipation: Secondary | ICD-10-CM

## 2022-05-09 DIAGNOSIS — R933 Abnormal findings on diagnostic imaging of other parts of digestive tract: Secondary | ICD-10-CM | POA: Diagnosis not present

## 2022-05-09 NOTE — Patient Instructions (Addendum)
It was my pleasure to provide care to you today. Based on our discussion, I am providing you with my recommendations below:  RECOMMENDATION(S):   Follow up with Dr. Sedalia Muta.   Continue Miralax 17 g daily and prune juice.   FOLLOW UP:   I am recommending that you have a(n) EGD/colonoscopy completed. Per your request, my staff did not schedule the procedure(s) today. When you are ready to proceed with scheduling, please contact my office at [phone].   NOTE:   At the time of scheduling your procedure, you will also be scheduled for a pre-visit with my nurse. During this appointment, you will be provided with your prep instructions.   BMI:  If you are age 43 or younger, your body mass index should be between 19-25. Your There is no height or weight on file to calculate BMI. If this is out of the aformentioned range listed, please consider follow up with your Primary Care Provider.   MY CHART:  The [facility] providers would like to encourage you to use [facility] to communicate with providers for non-urgent requests or questions.  Due to long hold times on the telephone, sending your provider a message by [facility] may be a faster and more efficient way to get a response.  Please allow 48 business hours for a response.  Please remember that this is for non-urgent requests.   Thank you for trusting me with your gastrointestinal care!    Tressia Danas, MD, MPH

## 2022-05-09 NOTE — Progress Notes (Signed)
Referring Provider: Blane Ohara, MD Primary Care Physician:  Blane Ohara, MD   Reason for Consultation:  Abdominal pain   IMPRESSION:  Abdominal pain not explained by CT x 2 Abnormal CT of the liver and biliary tree with normal liver enzymes    - may be explained by prior cholecystectomy Chronic constipation with significant stool burden on CT H pylori serum test positive with 2 weeks of triple therapy  Father would like to evaluate for problems related to rib fracture prior to proceeding with any endoscopic evaluation. But, I have recommended endoscopic evaluation to evaluate for organic GI disease.  PLAN: - Follow-up with Dr. Sedalia Muta re: rib fracture as the cause of the pain - Continue Miralax 17g daily and prune juice, also discussed Metamucil - Abdominal ultrasound to follow-up on CBD abnormality if symptoms persist - EGD and colonoscopy at the hospital with a 2 day bowel prep if/when the patient and family would like to proceed   HPI: Normal Recinos is a 46 y.o. male referred by Dr. Sedalia Muta for abdominal pain.  The history is obtained through the patient, his parents and sister who accompany him to this appointment, and review of his electronic health record.  He has cerebral palsy, meningitis during infancy, and hydrocephalus requiring ventriculoperitoneal shunt.  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  He had a cholecystectomy for gallstones in 2000.  Past medical history is also significant for urinary tract infections and a seizure over 35 years ago.  He works at [employer].  He presents with 8 weeks of left-sided abdominal pain that is relieved by sitting up. No aggravating features. Not related to eating or defecation. Intermittent nocturnal symptoms. No fevers, change in appetite, or weight loss.   Father feels his intestines have shifted.  He is also very concerned about a broken rib.   Baseline bowel habits are 1 formed stool every 2 to 3 days. They do not feel  that he is constipated.  Prior workup including CT of the abdomen and pelvis x 2 and labs were normal other than showing a large amount of stool, changes of cholecystectomy with some mildly increased intra and extrahepatic biliary ductal dilatation. The common bile duct measures 58mm and there is a hypodensity in the left lobe of the liver thought to be a cyst or hemangioma.  Pelvic ultrasound 02/02/22 possible undescended inguinal canal  Labs 12/27/2021 showed a positive H. pylori serum IgA. Labs 04/13/2022 showed normal liver enzymes and a lipase of 32  He received treatment with omeprazole, amoxicillin, and clarithromycin for 2 weeks in March after an H. pylori antigen test returned positive without any change in symptoms.  Sister with alternating diarrhea and constipation and gluten intolerance and lactose intolerance. Mom with colitis and history of PUD. There is no known family history of colon cancer or polyps. No family history of stomach cancer or other GI malignancy. No family history of inflammatory bowel disease or celiac.    Past Medical History:  Diagnosis Date   Cerebral palsy, hemiplegic (HCC)    Chronic idiopathic constipation    Seizure disorder (HCC)     Past Surgical History:  Procedure Laterality Date   ADENOIDECTOMY     CHOLECYSTECTOMY     cranial-peritoneal shunt     TONSILLECTOMY      Current Outpatient Medications  Medication Sig Dispense Refill   acetaminophen (TYLENOL) 500 MG tablet Take 500-1,000 mg by mouth as needed.     baclofen (LIORESAL) 20 MG tablet Take 1  tablet (20 mg total) by mouth 4 (four) times daily. (Patient taking differently: Take 10 mg by mouth 4 (four) times daily.) 120 each 2   chlorhexidine (PERIDEX) 0.12 % solution Use as directed 15 mLs in the mouth or throat 2 (two) times daily. 120 mL 5   loratadine (KLS ALLERCLEAR) 10 MG tablet Take 10 mg by mouth as needed for allergies.     polyethylene glycol powder (GLYCOLAX/MIRALAX) 17 GM/SCOOP  powder Take 17 g by mouth daily.     omeprazole (PRILOSEC) 40 MG capsule Take 1 capsule (40 mg total) by mouth in the morning and at bedtime. (Patient not taking: Reported on 05/09/2022) 60 capsule 1   tamsulosin (FLOMAX) 0.4 MG CAPS capsule Take 1 capsule (0.4 mg total) by mouth daily after supper. (Patient not taking: Reported on 05/09/2022) 30 capsule 3   zolpidem (AMBIEN) 5 MG tablet Take 1 tablet (5 mg total) by mouth at bedtime as needed for sleep. (Patient not taking: Reported on 05/09/2022) 30 tablet 1   No current facility-administered medications for this visit.    Allergies as of 05/09/2022 - Review Complete 05/09/2022  Allergen Reaction Noted   Morphine Other (See Comments) 12/17/2016    Family History  Problem Relation Age of Onset   Hyperlipidemia Mother    Hypertension Mother    Ulcerative colitis Mother    Diabetes Mother    Migraines Mother    Osteoarthritis Mother    Diabetes Father    Hypertension Father    Migraines Sister    Breast cancer Maternal Grandmother    Heart attack Maternal Grandmother    Diabetes Maternal Grandfather    Diabetes Maternal Uncle    Heart attack Maternal Great-grandfather    Cancer Other        Prostate and Breast    Social History   Socioeconomic History   Marital status: Single    Spouse name: Not on file   Number of children: 0   Years of education: Not on file   Highest education level: Not on file  Occupational History   Occupation: Disabled  Tobacco Use   Smoking status: Never   Smokeless tobacco: Never  Vaping Use   Vaping Use: Never used  Substance and Sexual Activity   Alcohol use: Never   Drug use: Never   Sexual activity: Never  Other Topics Concern   Not on file  Social History Narrative   Not on file   Social Determinants of Health   Financial Resource Strain: Low Risk  (01/24/2022)   Overall Financial Resource Strain (CARDIA)    Difficulty of Paying Living Expenses: Not hard at all  Food Insecurity: No  Food Insecurity (01/24/2022)   Hunger Vital Sign    Worried About Running Out of Food in the Last Year: Never true    Ran Out of Food in the Last Year: Never true  Transportation Needs: No Transportation Needs (01/24/2022)   PRAPARE - Administrator, Civil Service (Medical): No    Lack of Transportation (Non-Medical): No  Physical Activity: Inactive (01/24/2022)   Exercise Vital Sign    Days of Exercise per Week: 0 days    Minutes of Exercise per Session: 0 min  Stress: No Stress Concern Present (01/24/2022)   [employer] of Occupational Health - Occupational Stress Questionnaire    Feeling of Stress : Not at all  Social Connections: Unknown (01/24/2022)   Social Connection and Isolation Panel [NHANES]    Frequency of Communication  with Friends and Family: Not on file    Frequency of Social Gatherings with Friends and Family: Not on file    Attends Religious Services: Not on file    Active Member of Clubs or Organizations: No    Attends Banker Meetings: Never    Marital Status: Never married  Intimate Partner Violence: Not At Risk (01/24/2022)   Humiliation, Afraid, Rape, and Kick questionnaire    Fear of Current or Ex-Partner: No    Emotionally Abused: No    Physically Abused: No    Sexually Abused: No    Review of Systems: 12 system ROS is negative except as noted above.   Physical Exam: General:   Alert,  well-nourished, pleasant and cooperative in NAD, examined in a wheelchair Head:  Normocephalic and atraumatic. Eyes:  Sclera clear, no icterus.   Conjunctiva pink. Ears:  Normal auditory acuity. Nose:  No deformity, discharge,  or lesions. Mouth:  No deformity or lesions.   Neck:  Supple; no masses or thyromegaly. Lungs:  Clear throughout to auscultation.   No wheezes. Heart:  Regular rate and rhythm; no murmurs. Abdomen:  Soft, thin, scoliotic with altered abdominal wall, nondistended, normal bowel sounds, no rebound or guarding. No  hepatosplenomegaly.   Rectal:  Deferred  Msk:  Symmetrical.  Muscle wasting and contractions.   LAD: No inguinal or umbilical LAD Extremities:  No clubbing or edema. Neurologic:  Alert and  oriented x4;  grossly nonfocal Skin:  Intact without significant lesions or rashes. Psych:  Alert and cooperative. Normal mood and affect.     L. Orvan Falconer, MD, MPH 05/09/2022, 9:57 AM

## 2022-05-09 NOTE — Telephone Encounter (Signed)
Patient added to waitlist for hospital procedures. He will be contacted at soonest date available.

## 2022-05-09 NOTE — Telephone Encounter (Signed)
-----   Message from Bradley [name], [place] sent at 05/09/2022 10:35 AM EDT ----- Regarding: hospital list Please add to hospital list for November. Patient did not want to schedule for October.  EGD/Colon  Dx: Screening crc, epigastric pain, chronic constipation, hx h. Pylori    Pt has CP and is in wheelchair

## 2022-05-10 ENCOUNTER — Encounter: Payer: Self-pay | Admitting: Family Medicine

## 2022-05-10 ENCOUNTER — Telehealth: Payer: Self-pay

## 2022-05-10 NOTE — Telephone Encounter (Signed)
Patient's father called and stated that the patient went to see GI yesterday, and stated that they couldn't help Bradley [name] with the pain that he was having that he would need to follow back up with [place] and go see an orthopedic for the pain that he is having. They stated that there was nothing they could further do for the patient and the father stated he would like for you to look at the Visit notes from yesterday at [facility]. Please advise.

## 2022-05-11 NOTE — Telephone Encounter (Signed)
Patient's father informed. Ok to go ahead and set up CT chest at [facility] outpatient [facility]. Please put order in and send to amber! Thank you!

## 2022-05-13 ENCOUNTER — Other Ambulatory Visit: Payer: Self-pay | Admitting: Family Medicine

## 2022-05-13 DIAGNOSIS — R0789 Other chest pain: Secondary | ICD-10-CM

## 2022-05-13 NOTE — Telephone Encounter (Signed)
Order sent.

## 2022-05-14 ENCOUNTER — Encounter: Payer: Self-pay | Admitting: Physician Assistant

## 2022-05-14 ENCOUNTER — Ambulatory Visit (INDEPENDENT_AMBULATORY_CARE_PROVIDER_SITE_OTHER): Payer: Medicare Other | Admitting: Physician Assistant

## 2022-05-14 VITALS — BP 112/60 | HR 79 | Temp 97.4°F

## 2022-05-14 DIAGNOSIS — L304 Erythema intertrigo: Secondary | ICD-10-CM | POA: Diagnosis not present

## 2022-05-14 MED ORDER — FLUCONAZOLE 100 MG PO TABS: 100.0000 mg | ORAL_TABLET | Freq: Every day | ORAL | 0 refills | Status: DC

## 2022-05-14 MED ORDER — CLOTRIMAZOLE-BETAMETHASONE 1-0.05 % EX CREA: 1.0000 | TOPICAL_CREAM | Freq: Two times a day (BID) | CUTANEOUS | 0 refills | Status: DC

## 2022-05-14 NOTE — Progress Notes (Signed)
Acute Office Visit  Subjective:    Patient ID: Bradley [name], male    DOB: 1976/08/01, 46 y.o.   MRN: [id number]  Chief Complaint  Patient presents with   Rash    HPI: Patient is in today for complaints of rash in groin area.  Mother gives most of history - states grandson took him to bathroom today and noted redness in groin and odor.  Area does burn and is uncomfortable.  Unsure if having any urine symptoms.  Mother states no fever or other symptoms  Past Medical History:  Diagnosis Date   Cerebral palsy, hemiplegic (HCC)    Chronic idiopathic constipation    Seizure disorder (HCC)     Past Surgical History:  Procedure Laterality Date   ADENOIDECTOMY     CHOLECYSTECTOMY     cranial-peritoneal shunt     TONSILLECTOMY      Family History  Problem Relation Age of Onset   Hyperlipidemia Mother    Hypertension Mother    Ulcerative colitis Mother    Diabetes Mother    Migraines Mother    Osteoarthritis Mother    Diabetes Father    Hypertension Father    Migraines Sister    Breast cancer Maternal Grandmother    Heart attack Maternal Grandmother    Diabetes Maternal Grandfather    Diabetes Maternal Uncle    Heart attack Maternal Great-grandfather    Cancer Other        Prostate and Breast    Social History   Socioeconomic History   Marital status: Single    Spouse name: Not on file   Number of children: 0   Years of education: Not on file   Highest education level: Not on file  Occupational History   Occupation: Disabled  Tobacco Use   Smoking status: Never   Smokeless tobacco: Never  Vaping Use   Vaping Use: Never used  Substance and Sexual Activity   Alcohol use: Never   Drug use: Never   Sexual activity: Never  Other Topics Concern   Not on file  Social History Narrative   Not on file   Social Determinants of Health   Financial Resource Strain: Low Risk  (01/24/2022)   Overall Financial Resource Strain (CARDIA)    Difficulty of Paying  Living Expenses: Not hard at all  Food Insecurity: No Food Insecurity (01/24/2022)   Hunger Vital Sign    Worried About Running Out of Food in the Last Year: Never true    Ran Out of Food in the Last Year: Never true  Transportation Needs: No Transportation Needs (01/24/2022)   PRAPARE - Hydrologist (Medical): No    Lack of Transportation (Non-Medical): No  Physical Activity: Inactive (01/24/2022)   Exercise Vital Sign    Days of Exercise per Week: 0 days    Minutes of Exercise per Session: 0 min  Stress: No Stress Concern Present (01/24/2022)   [place]    Feeling of Stress : Not at all  Social Connections: Unknown (01/24/2022)   Social Connection and Isolation Panel [NHANES]    Frequency of Communication with Friends and Family: Not on file    Frequency of Social Gatherings with Friends and Family: Not on file    Attends Religious Services: Not on file    Active Member of Clubs or Organizations: No    Attends Archivist Meetings: Never    Marital Status: Never  married  Intimate Partner Violence: Not At Risk (01/24/2022)   Humiliation, Afraid, Rape, and Kick questionnaire    Fear of Current or Ex-Partner: No    Emotionally Abused: No    Physically Abused: No    Sexually Abused: No    Outpatient Medications Prior to Visit  Medication Sig Dispense Refill   acetaminophen (TYLENOL) 500 MG tablet Take 500-1,000 mg by mouth as needed.     baclofen (LIORESAL) 20 MG tablet Take 1 tablet (20 mg total) by mouth 4 (four) times daily. (Patient taking differently: Take 10 mg by mouth 4 (four) times daily.) 120 each 2   chlorhexidine (PERIDEX) 0.12 % solution Use as directed 15 mLs in the mouth or throat 2 (two) times daily. 120 mL 5   loratadine (KLS ALLERCLEAR) 10 MG tablet Take 10 mg by mouth as needed for allergies.     omeprazole (PRILOSEC) 40 MG capsule Take 1 capsule (40 mg total) by  mouth in the morning and at bedtime. 60 capsule 1   polyethylene glycol powder (GLYCOLAX/MIRALAX) 17 GM/SCOOP powder Take 17 g by mouth daily.     tamsulosin (FLOMAX) 0.4 MG CAPS capsule Take 1 capsule (0.4 mg total) by mouth daily after supper. 30 capsule 3   zolpidem (AMBIEN) 5 MG tablet Take 1 tablet (5 mg total) by mouth at bedtime as needed for sleep. 30 tablet 1   No facility-administered medications prior to visit.    Allergies  Allergen Reactions   Morphine Other (See Comments)    Other reaction(s): Unknown Had a hard time waking up after surgery. Had a hard time waking up after surgery.     Review of Systems    CONSTITUTIONAL: Negative for chills, fatigue, fever,   CARDIOVASCULAR: Negative for chest pain,  RESPIRATORY: Negative for recent cough and dyspnea.  GASTROINTESTINAL: Negative for abdominal pain, acid reflux symptoms, constipation, diarrhea, nausea and vomiting.   INTEGUMENTARY: see HPI      Objective:  PHYSICAL EXAM:   VS: BP 112/60 (BP Location: Left Arm, Patient Position: Sitting, Cuff Size: Small)   Pulse 79   Temp (!) 97.4 F (36.3 C) (Temporal)   SpO2 93%   GEN: Well nourished, well developed, in no acute distress - in wheelchair and moved to exam table  Cardiac: RRR; no murmurs, Respiratory:  normal respiratory rate and pattern with no distress - normal breath sounds with no rales, rhonchi, wheezes or rubs  Skin:red, inflamed skin in folds/crease of skin in groin/thigh -- no involvement of penis/testicles/rectal area     Health Maintenance Due  Topic Date Due   TETANUS/TDAP  Never done   COLONOSCOPY (Pts 45-21yr Insurance coverage will need to be confirmed)  Never done   COVID-19 Vaccine (3 - Moderna series) 03/22/2021   INFLUENZA VACCINE  05/01/2022    There are no preventive care reminders to display for this patient.   Lab Results  Component Value Date   TSH 0.373 (L) 01/02/2021   Lab Results  Component Value Date   WBC 4.7  12/27/2021   HGB 13.7 12/27/2021   HCT 38.7 12/27/2021   MCV 89 12/27/2021   PLT 463 (H) 12/27/2021   Lab Results  Component Value Date   NA 135 12/27/2021   K 4.1 12/27/2021   CO2 22 12/27/2021   GLUCOSE 87 12/27/2021   BUN 6 12/27/2021   CREATININE 0.45 (L) 12/27/2021   BILITOT 0.5 12/27/2021   ALKPHOS 114 12/27/2021   AST 15 12/27/2021   ALT  21 12/27/2021   PROT 6.8 12/27/2021   ALBUMIN 4.5 12/27/2021   CALCIUM 9.7 12/27/2021   EGFR 131 12/27/2021   Lab Results  Component Value Date   CHOL 166 07/12/2021   Lab Results  Component Value Date   HDL 64 07/12/2021   Lab Results  Component Value Date   LDLCALC 92 07/12/2021   Lab Results  Component Value Date   TRIG 47 07/12/2021   Lab Results  Component Value Date   CHOLHDL 2.6 07/12/2021   No results found for: "HGBA1C"     Assessment & Plan:   Problem List Items Addressed This Visit       Musculoskeletal and Integument   Intertrigo - Primary   Relevant Medications   clotrimazole-betamethasone (LOTRISONE) cream   fluconazole (DIFLUCAN) 100 MG tablet Recommend to keep area clean and very dry  To bring back urine specimen (not able to get in office)    Other Visit Diagnoses        Meds ordered this encounter  Medications   clotrimazole-betamethasone (LOTRISONE) cream    Sig: Apply 1 Application topically 2 (two) times daily.    Dispense:  30 g    Refill:  0    Order Specific Question:   Supervising Provider    Answer:   Shelton Silvas   fluconazole (DIFLUCAN) 100 MG tablet    Sig: Take 1 tablet (100 mg total) by mouth daily.    Dispense:  7 tablet    Refill:  0    Order Specific Question:   Supervising Provider    Answer:   Shelton Silvas    No orders of the defined types were placed in this encounter.    Follow-up: Return if symptoms worsen or fail to improve.  An After Visit Summary was printed and given to the patient.  Yetta Flock [facility] [phone]

## 2022-05-23 DIAGNOSIS — R29890 Loss of height: Secondary | ICD-10-CM | POA: Diagnosis not present

## 2022-05-23 DIAGNOSIS — R918 Other nonspecific abnormal finding of lung field: Secondary | ICD-10-CM | POA: Diagnosis not present

## 2022-05-23 DIAGNOSIS — Q859 Phakomatosis, unspecified: Secondary | ICD-10-CM | POA: Diagnosis not present

## 2022-05-23 DIAGNOSIS — S2241XD Multiple fractures of ribs, right side, subsequent encounter for fracture with routine healing: Secondary | ICD-10-CM | POA: Diagnosis not present

## 2022-05-23 DIAGNOSIS — S2231XD Fracture of one rib, right side, subsequent encounter for fracture with routine healing: Secondary | ICD-10-CM | POA: Diagnosis not present

## 2022-05-29 ENCOUNTER — Telehealth: Payer: Self-pay

## 2022-05-29 ENCOUNTER — Other Ambulatory Visit: Payer: Self-pay

## 2022-05-29 NOTE — Telephone Encounter (Signed)
Spoke with patient's father in regards to setting up endo colon at [facility] for 08/06/22. He was agitated on the phone & stated he was not happy that test results from our office are not being communicated with them before scheduling future procedures. He has been advised that Dr. Orvan Falconer has not ordered any test for the patient, and that he is referring to other provider offices that may have ordered test. It seems as if they still have questions to those results, so he has been advised that patient will be scheduled once they have received their answers from current test.

## 2022-05-29 NOTE — Telephone Encounter (Signed)
Bradley [name] calling requesting update on results of chest CT done in [place].   Results in care everywhere. Please review.   [name] 05/29/22 3:36 PM

## 2022-05-30 NOTE — Telephone Encounter (Signed)
Dr. Sedalia Muta discussed results with patient's family.  She recommended that they keep the dexa scan appointment.  They are going to use [facility] and recheck in the office 07/05/2022.

## 2022-06-05 ENCOUNTER — Ambulatory Visit: Payer: Medicare Other | Admitting: Family Medicine

## 2022-06-06 ENCOUNTER — Telehealth: Payer: Self-pay

## 2022-06-06 ENCOUNTER — Other Ambulatory Visit: Payer: Self-pay | Admitting: Physician Assistant

## 2022-06-06 NOTE — Telephone Encounter (Signed)
Family member called requesting a referral for water therapy at [facility].

## 2022-06-08 ENCOUNTER — Other Ambulatory Visit: Payer: Self-pay

## 2022-06-08 DIAGNOSIS — G808 Other cerebral palsy: Secondary | ICD-10-CM

## 2022-06-08 NOTE — Telephone Encounter (Signed)
Done

## 2022-06-20 ENCOUNTER — Ambulatory Visit (INDEPENDENT_AMBULATORY_CARE_PROVIDER_SITE_OTHER): Payer: Medicare Other | Admitting: Family Medicine

## 2022-06-20 ENCOUNTER — Encounter: Payer: Self-pay | Admitting: Family Medicine

## 2022-06-20 ENCOUNTER — Other Ambulatory Visit: Payer: Self-pay | Admitting: Family Medicine

## 2022-06-20 DIAGNOSIS — G800 Spastic quadriplegic cerebral palsy: Secondary | ICD-10-CM

## 2022-06-20 DIAGNOSIS — L304 Erythema intertrigo: Secondary | ICD-10-CM | POA: Diagnosis not present

## 2022-06-20 DIAGNOSIS — H1033 Unspecified acute conjunctivitis, bilateral: Secondary | ICD-10-CM | POA: Diagnosis not present

## 2022-06-20 MED ORDER — BACLOFEN 20 MG PO TABS: 20.0000 mg | ORAL_TABLET | Freq: Four times a day (QID) | ORAL | 1 refills | Status: AC

## 2022-06-20 MED ORDER — OFLOXACIN 0.3 % OP SOLN: 1.0000 [drp] | Freq: Four times a day (QID) | OPHTHALMIC | 0 refills | Status: DC

## 2022-06-20 MED ORDER — FLUCONAZOLE 100 MG PO TABS: 100.0000 mg | ORAL_TABLET | Freq: Every day | ORAL | 1 refills | Status: DC

## 2022-06-20 NOTE — Assessment & Plan Note (Addendum)
Increase baclofen back to 20 mg 4 times daily per mother's request.  Patient has significant contractures and makes it difficult to lift him.

## 2022-06-20 NOTE — Assessment & Plan Note (Signed)
Fluconazole 100 mg daily x 7 days ?

## 2022-06-20 NOTE — Assessment & Plan Note (Signed)
Ofloxacin eyedrops sent.

## 2022-06-20 NOTE — Progress Notes (Signed)
Acute Office Visit  Subjective:    Patient ID: Bradley [name], male    DOB: 1975-11-08, 46 y.o.   MRN: [id number]  Chief Complaint  Patient presents with   Conjunctivitis   Rash    HPI: Patient is in today for yeast infection in groin area.  The patient's also been having some green discharge from his eyes over the last 1 to 2 weeks.  Red eyes and itchy.  I was seeing the patient's father and he is in the car.  He has significant disabilities and has difficulty getting out of car at times.  I saw him from the window.  Past Medical History:  Diagnosis Date   Cerebral palsy, hemiplegic (HCC)    Chronic idiopathic constipation    Seizure disorder (HCC)     Past Surgical History:  Procedure Laterality Date   ADENOIDECTOMY     CHOLECYSTECTOMY     cranial-peritoneal shunt     TONSILLECTOMY      Family History  Problem Relation Age of Onset   Hyperlipidemia Mother    Hypertension Mother    Ulcerative colitis Mother    Diabetes Mother    Migraines Mother    Osteoarthritis Mother    Diabetes Father    Hypertension Father    Migraines Sister    Breast cancer Maternal Grandmother    Heart attack Maternal Grandmother    Diabetes Maternal Grandfather    Diabetes Maternal Uncle    Heart attack Maternal Great-grandfather    Cancer Other        Prostate and Breast    Social History   Socioeconomic History   Marital status: Single    Spouse name: Not on file   Number of children: 0   Years of education: Not on file   Highest education level: Not on file  Occupational History   Occupation: Disabled  Tobacco Use   Smoking status: Never   Smokeless tobacco: Never  Vaping Use   Vaping Use: Never used  Substance and Sexual Activity   Alcohol use: Never   Drug use: Never   Sexual activity: Never  Other Topics Concern   Not on file  Social History Narrative   Not on file   Social Determinants of Health   Financial Resource Strain: Low Risk  (01/24/2022)    Overall Financial Resource Strain (CARDIA)    Difficulty of Paying Living Expenses: Not hard at all  Food Insecurity: No Food Insecurity (01/24/2022)   Hunger Vital Sign    Worried About Running Out of Food in the Last Year: Never true    Ran Out of Food in the Last Year: Never true  Transportation Needs: No Transportation Needs (01/24/2022)   PRAPARE - Hydrologist (Medical): No    Lack of Transportation (Non-Medical): No  Physical Activity: Inactive (01/24/2022)   Exercise Vital Sign    Days of Exercise per Week: 0 days    Minutes of Exercise per Session: 0 min  Stress: No Stress Concern Present (01/24/2022)   [place]    Feeling of Stress : Not at all  Social Connections: Unknown (01/24/2022)   Social Connection and Isolation Panel [NHANES]    Frequency of Communication with Friends and Family: Not on file    Frequency of Social Gatherings with Friends and Family: Not on file    Attends Religious Services: Not on file    Active Member of Clubs or  Organizations: No    Attends Archivist Meetings: Never    Marital Status: Never married  Intimate Partner Violence: Not At Risk (01/24/2022)   Humiliation, Afraid, Rape, and Kick questionnaire    Fear of Current or Ex-Partner: No    Emotionally Abused: No    Physically Abused: No    Sexually Abused: No    Outpatient Medications Prior to Visit  Medication Sig Dispense Refill   acetaminophen (TYLENOL) 500 MG tablet Take 500-1,000 mg by mouth as needed.     chlorhexidine (PERIDEX) 0.12 % solution Use as directed 15 mLs in the mouth or throat 2 (two) times daily. 120 mL 5   clotrimazole-betamethasone (LOTRISONE) cream Apply 1 Application topically 2 (two) times daily. 30 g 0   loratadine (KLS ALLERCLEAR) 10 MG tablet Take 10 mg by mouth as needed for allergies.     omeprazole (PRILOSEC) 40 MG capsule Take 1 capsule (40 mg total) by mouth  in the morning and at bedtime. 60 capsule 1   polyethylene glycol powder (GLYCOLAX/MIRALAX) 17 GM/SCOOP powder Take 17 g by mouth daily.     tamsulosin (FLOMAX) 0.4 MG CAPS capsule Take 1 capsule (0.4 mg total) by mouth daily after supper. 30 capsule 3   zolpidem (AMBIEN) 5 MG tablet Take 1 tablet (5 mg total) by mouth at bedtime as needed for sleep. 30 tablet 1   No facility-administered medications prior to visit.    Allergies  Allergen Reactions   Morphine Other (See Comments)    Other reaction(s): Unknown Had a hard time waking up after surgery. Had a hard time waking up after surgery.     Review of Systems     Objective:    Physical Exam Vitals reviewed.  Constitutional:      Appearance: Normal appearance.  Eyes:     General:        Right eye: Discharge (Yellow-green discharge.) present.        Left eye: Discharge (Yellow-green discharge) present.    Comments: Bilateral conjunctive eye injected.  Neurological:     Mental Status: He is alert.   Unable to easily look at groin area however this is a recurrent issue with this patient.  There were no vitals taken for this visit. Wt Readings from Last 3 Encounters:  No data found for Wt    Health Maintenance Due  Topic Date Due   TETANUS/TDAP  Never done   COLONOSCOPY (Pts 45-45yr Insurance coverage will need to be confirmed)  Never done   COVID-19 Vaccine (3 - Moderna series) 03/22/2021   INFLUENZA VACCINE  05/01/2022    There are no preventive care reminders to display for this patient.   Lab Results  Component Value Date   TSH 0.373 (L) 01/02/2021   Lab Results  Component Value Date   WBC 4.7 12/27/2021   HGB 13.7 12/27/2021   HCT 38.7 12/27/2021   MCV 89 12/27/2021   PLT 463 (H) 12/27/2021   Lab Results  Component Value Date   NA 135 12/27/2021   K 4.1 12/27/2021   CO2 22 12/27/2021   GLUCOSE 87 12/27/2021   BUN 6 12/27/2021   CREATININE 0.45 (L) 12/27/2021   BILITOT 0.5 12/27/2021    ALKPHOS 114 12/27/2021   AST 15 12/27/2021   ALT 21 12/27/2021   PROT 6.8 12/27/2021   ALBUMIN 4.5 12/27/2021   CALCIUM 9.7 12/27/2021   EGFR 131 12/27/2021   Lab Results  Component Value Date   CHOL 166  07/12/2021   Lab Results  Component Value Date   HDL 64 07/12/2021   Lab Results  Component Value Date   LDLCALC 92 07/12/2021   Lab Results  Component Value Date   TRIG 47 07/12/2021   Lab Results  Component Value Date   CHOLHDL 2.6 07/12/2021   No results found for: "HGBA1C"     Assessment & Plan:   Problem List Items Addressed This Visit       Nervous and Auditory   Spastic quadriplegic cerebral palsy (HCC)    Increase baclofen back to 20 mg 4 times daily per mother's request.  Patient has significant contractures and makes it difficult to lift him.        Musculoskeletal and Integument   Intertrigo    Fluconazole 100 mg daily x7 days.        Other   Acute bacterial conjunctivitis of both eyes - Primary    Ofloxacin eyedrops sent.      No orders of the defined types were placed in this encounter.   No orders of the defined types were placed in this encounter.    Follow-up: Return in about 15 days (around 07/05/2022).  An After Visit Summary was printed and given to the patient.  Rochel Brome, MD  Family Practice [phone]

## 2022-06-25 ENCOUNTER — Telehealth: Payer: Self-pay | Admitting: Gastroenterology

## 2022-06-25 DIAGNOSIS — G800 Spastic quadriplegic cerebral palsy: Secondary | ICD-10-CM | POA: Diagnosis not present

## 2022-06-25 NOTE — Telephone Encounter (Signed)
Inbound call from patients mother wanting to schedule patient for procedure. OV note from Dr. Tarri Glenn states patient needs colonoscopy and endoscopy at the hospital. I advised patients mother and she verbalized understanding and is requesting a call back to discuss scheduling. Please advise.

## 2022-06-26 NOTE — Telephone Encounter (Signed)
Patient is returning your call.  

## 2022-06-26 NOTE — Telephone Encounter (Signed)
Left message for patient's mom to call back. Dr. Tarri Glenn next hospital date is 09/10/22.

## 2022-06-27 ENCOUNTER — Other Ambulatory Visit: Payer: Self-pay | Admitting: *Deleted

## 2022-06-27 DIAGNOSIS — R109 Unspecified abdominal pain: Secondary | ICD-10-CM

## 2022-06-27 DIAGNOSIS — K5909 Other constipation: Secondary | ICD-10-CM

## 2022-06-27 DIAGNOSIS — R933 Abnormal findings on diagnostic imaging of other parts of digestive tract: Secondary | ICD-10-CM

## 2022-06-27 NOTE — Telephone Encounter (Signed)
Patient scheduled for 09/10/22 @ 11 am at [facility].

## 2022-06-27 NOTE — Telephone Encounter (Signed)
Spoke with mom and she agreed to have procedure on 09/10/22.

## 2022-06-29 DIAGNOSIS — G4700 Insomnia, unspecified: Secondary | ICD-10-CM | POA: Diagnosis not present

## 2022-06-29 DIAGNOSIS — G8250 Quadriplegia, unspecified: Secondary | ICD-10-CM | POA: Diagnosis not present

## 2022-06-29 DIAGNOSIS — R6250 Unspecified lack of expected normal physiological development in childhood: Secondary | ICD-10-CM | POA: Diagnosis not present

## 2022-06-29 DIAGNOSIS — G808 Other cerebral palsy: Secondary | ICD-10-CM | POA: Diagnosis not present

## 2022-06-29 DIAGNOSIS — R269 Unspecified abnormalities of gait and mobility: Secondary | ICD-10-CM | POA: Diagnosis not present

## 2022-07-05 ENCOUNTER — Ambulatory Visit (INDEPENDENT_AMBULATORY_CARE_PROVIDER_SITE_OTHER): Payer: Medicare Other | Admitting: Family Medicine

## 2022-07-05 ENCOUNTER — Encounter: Payer: Self-pay | Admitting: Family Medicine

## 2022-07-05 VITALS — BP 90/60 | HR 73 | Temp 98.3°F | Resp 15 | Ht 60.0 in | Wt 120.0 lb

## 2022-07-05 DIAGNOSIS — L304 Erythema intertrigo: Secondary | ICD-10-CM

## 2022-07-05 DIAGNOSIS — H1033 Unspecified acute conjunctivitis, bilateral: Secondary | ICD-10-CM | POA: Diagnosis not present

## 2022-07-05 DIAGNOSIS — H6123 Impacted cerumen, bilateral: Secondary | ICD-10-CM

## 2022-07-05 DIAGNOSIS — M4145 Neuromuscular scoliosis, thoracolumbar region: Secondary | ICD-10-CM

## 2022-07-05 DIAGNOSIS — E43 Unspecified severe protein-calorie malnutrition: Secondary | ICD-10-CM

## 2022-07-05 DIAGNOSIS — G800 Spastic quadriplegic cerebral palsy: Secondary | ICD-10-CM | POA: Diagnosis not present

## 2022-07-05 NOTE — Progress Notes (Signed)
Subjective:  Patient ID: Bradley [name], male    DOB: 02-04-1976  Age: 46 y.o. MRN: [id number]  Chief Complaint  Patient presents with   Intertrigo   spastic quadriplegic cerebral palsy    HPI Patient was seen 15 days ago for Intertrigo and conjunctivitis. His mother says his rash is improving and conjunctivitis has resolved.  Baclofen was changed for spastic quadriplegic Dr Tobie Poet recommended Baclofen 20 mg 4 times a day, but is taking TID.  Chest pain improved. Related to rib fractures.   Nocturia has improved.   Requesting ears checked for wax.   Current Outpatient Medications on File Prior to Visit  Medication Sig Dispense Refill   acetaminophen (TYLENOL) 500 MG tablet Take 500-1,000 mg by mouth as needed.     baclofen (LIORESAL) 20 MG tablet Take 1 tablet (20 mg total) by mouth 4 (four) times daily. 360 each 1   chlorhexidine (PERIDEX) 0.12 % solution Use as directed 15 mLs in the mouth or throat 2 (two) times daily. 120 mL 5   fluconazole (DIFLUCAN) 100 MG tablet Take 1 tablet (100 mg total) by mouth daily. 7 tablet 1   loratadine (KLS ALLERCLEAR) 10 MG tablet Take 10 mg by mouth as needed for allergies.     ofloxacin (OCUFLOX) 0.3 % ophthalmic solution Place 1 drop into both eyes 4 (four) times daily. 5 mL 0   ofloxacin (OCUFLOX) 0.3 % ophthalmic solution Apply to eye.     omeprazole (PRILOSEC) 40 MG capsule Take 1 capsule (40 mg total) by mouth in the morning and at bedtime. 60 capsule 1   polyethylene glycol powder (GLYCOLAX/MIRALAX) 17 GM/SCOOP powder Take 17 g by mouth daily.     tamsulosin (FLOMAX) 0.4 MG CAPS capsule Take 1 capsule (0.4 mg total) by mouth daily after supper. 30 capsule 3   zolpidem (AMBIEN) 5 MG tablet Take 1 tablet (5 mg total) by mouth at bedtime as needed for sleep. 30 tablet 1   No current facility-administered medications on file prior to visit.   Past Medical History:  Diagnosis Date   Cerebral palsy, hemiplegic (HCC)    Chronic idiopathic  constipation    Seizure disorder (HCC)    Past Surgical History:  Procedure Laterality Date   ADENOIDECTOMY     CHOLECYSTECTOMY     cranial-peritoneal shunt     TONSILLECTOMY      Family History  Problem Relation Age of Onset   Hyperlipidemia Mother    Hypertension Mother    Ulcerative colitis Mother    Diabetes Mother    Migraines Mother    Osteoarthritis Mother    Diabetes Father    Hypertension Father    Migraines Sister    Breast cancer Maternal Grandmother    Heart attack Maternal Grandmother    Diabetes Maternal Grandfather    Diabetes Maternal Uncle    Heart attack Maternal Great-grandfather    Cancer Other        Prostate and Breast   Social History   Socioeconomic History   Marital status: Single    Spouse name: Not on file   Number of children: 0   Years of education: Not on file   Highest education level: Not on file  Occupational History   Occupation: Disabled  Tobacco Use   Smoking status: Never   Smokeless tobacco: Never  Vaping Use   Vaping Use: Never used  Substance and Sexual Activity   Alcohol use: Never   Drug use: Never   Sexual  activity: Never  Other Topics Concern   Not on file  Social History Narrative   Not on file   Social Determinants of Health   Financial Resource Strain: Low Risk  (01/24/2022)   Overall Financial Resource Strain (CARDIA)    Difficulty of Paying Living Expenses: Not hard at all  Food Insecurity: No Food Insecurity (01/24/2022)   Hunger Vital Sign    Worried About Running Out of Food in the Last Year: Never true    Ran Out of Food in the Last Year: Never true  Transportation Needs: No Transportation Needs (01/24/2022)   PRAPARE - Administrator, Civil Service (Medical): No    Lack of Transportation (Non-Medical): No  Physical Activity: Inactive (01/24/2022)   Exercise Vital Sign    Days of Exercise per Week: 0 days    Minutes of Exercise per Session: 0 min  Stress: No Stress Concern Present  (01/24/2022)   [employer] of Occupational Health - Occupational Stress Questionnaire    Feeling of Stress : Not at all  Social Connections: Unknown (01/24/2022)   Social Connection and Isolation Panel [NHANES]    Frequency of Communication with Friends and Family: Not on file    Frequency of Social Gatherings with Friends and Family: Not on file    Attends Religious Services: Not on file    Active Member of Clubs or Organizations: No    Attends Banker Meetings: Never    Marital Status: Never married    Review of Systems  Constitutional:  Negative for chills, fatigue, fever and unexpected weight change.  HENT:  Negative for congestion, ear pain, sinus pain and sore throat.   Respiratory:  Negative for cough and shortness of breath.   Cardiovascular:  Negative for chest pain and palpitations.  Gastrointestinal:  Negative for abdominal pain, blood in stool, constipation, diarrhea, nausea and vomiting.  Endocrine: Negative for polydipsia.  Genitourinary:  Negative for dysuria.  Musculoskeletal:  Negative for back pain.       Spastic quadriplegic cerebral palsy  Skin:  Positive for rash.  Neurological:  Negative for headaches.     Objective:  BP 90/60   Pulse 73   Temp 98.3 F (36.8 C)   Resp 15   Ht 5' (1.524 m)   Wt 120 lb (54.4 kg) Comment: approximately. patient on wc  SpO2 95%   BMI 23.44 kg/m      07/05/2022   10:44 AM 05/14/2022    1:41 PM 05/09/2022    9:52 AM  BP/Weight  Systolic BP 90 112 114  Diastolic BP 60 60 70  Wt. (Lbs) 120    BMI 23.44 kg/m2      Physical Exam Vitals reviewed.  Cardiovascular:     Rate and Rhythm: Normal rate and regular rhythm.     Heart sounds: Normal heart sounds.  Pulmonary:     Effort: Pulmonary effort is normal.     Breath sounds: Normal breath sounds.  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  Neurological:     Mental Status: He is alert.     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       Relevant Orders   CBC with Differential/Platelet (Completed)   Comprehensive metabolic panel (Completed)   Bilateral impacted cerumen    BL irrigation successfully.        Musculoskeletal and Integument   Intertrigo    Ordered nystop.      Neuromuscular scoliosis of thoracolumbar region    Unable to complete dexa due to body position.       Relevant Orders   CBC with Differential/Platelet (Completed)   Comprehensive metabolic panel (Completed)   VITAMIN D 25 Hydroxy (Vit-D Deficiency, Fractures) (Completed)   Severe malnutrition ([place])    Check vitamin D level.  Continue to eat three meals per day.       Relevant Orders   VITAMIN D 25 Hydroxy (Vit-D Deficiency, Fractures) (Completed)     Other   Acute bacterial conjunctivitis of both eyes    Resolved.     .  No orders of the defined types were placed in this encounter.   Orders Placed This Encounter  Procedures   CBC with Differential/Platelet   Comprehensive metabolic panel   VITAMIN D 25 Hydroxy (Vit-D Deficiency, Fractures)     Follow-up: Return in about 6 months (around 01/04/2023).  An After Visit Summary was printed and given to the patient.  Rochel Brome, MD  Family Practice [phone]

## 2022-07-06 LAB — VITAMIN D 25 HYDROXY (VIT D DEFICIENCY, FRACTURES): Vit D, 25-Hydroxy: 26.9 ng/mL — ABNORMAL LOW (ref 30.0–100.0)

## 2022-07-06 LAB — COMPREHENSIVE METABOLIC PANEL
ALT: 21 IU/L (ref 0–44)
AST: 13 IU/L (ref 0–40)
Albumin/Globulin Ratio: 1.7 (ref 1.2–2.2)
Albumin: 4.3 g/dL (ref 4.1–5.1)
Alkaline Phosphatase: 107 IU/L (ref 44–121)
BUN/Creatinine Ratio: 20 (ref 9–20)
BUN: 11 mg/dL (ref 6–24)
Bilirubin Total: 0.3 mg/dL (ref 0.0–1.2)
CO2: 23 mmol/L (ref 20–29)
Calcium: 9.6 mg/dL (ref 8.7–10.2)
Chloride: 101 mmol/L (ref 96–106)
Creatinine, Ser: 0.55 mg/dL — ABNORMAL LOW (ref 0.76–1.27)
Globulin, Total: 2.5 g/dL (ref 1.5–4.5)
Glucose: 93 mg/dL (ref 70–99)
Potassium: 4.3 mmol/L (ref 3.5–5.2)
Sodium: 138 mmol/L (ref 134–144)
Total Protein: 6.8 g/dL (ref 6.0–8.5)
eGFR: 122 mL/min/{1.73_m2} (ref >59)

## 2022-07-06 LAB — CBC WITH DIFFERENTIAL/PLATELET
Basophils Absolute: 0.1 10*3/uL (ref 0.0–0.2)
Basos: 1 %
EOS (ABSOLUTE): 0.1 10*3/uL (ref 0.0–0.4)
Eos: 2 %
Hematocrit: 40.9 % (ref 37.5–51.0)
Hemoglobin: 14 g/dL (ref 13.0–17.7)
Immature Grans (Abs): 0 10*3/uL (ref 0.0–0.1)
Immature Granulocytes: 0 %
Lymphocytes Absolute: 1.4 10*3/uL (ref 0.7–3.1)
Lymphs: 34 %
MCH: 31 pg (ref 26.6–33.0)
MCHC: 34.2 g/dL (ref 31.5–35.7)
MCV: 91 fL (ref 79–97)
Monocytes Absolute: 0.3 10*3/uL (ref 0.1–0.9)
Monocytes: 8 %
Neutrophils Absolute: 2.4 10*3/uL (ref 1.4–7.0)
Neutrophils: 55 %
Platelets: 399 10*3/uL (ref 150–450)
RBC: 4.52 x10E6/uL (ref 4.14–5.80)
RDW: 12.9 % (ref 11.6–15.4)
WBC: 4.3 10*3/uL (ref 3.4–10.8)

## 2022-07-08 NOTE — Assessment & Plan Note (Addendum)
BL irrigation successfully.

## 2022-07-08 NOTE — Assessment & Plan Note (Signed)
Resolved

## 2022-07-08 NOTE — Assessment & Plan Note (Signed)
Ordered nystop.

## 2022-07-08 NOTE — Assessment & Plan Note (Signed)
Check vitamin D level.  Continue to eat three meals per day.

## 2022-07-08 NOTE — Assessment & Plan Note (Signed)
Unable to complete dexa due to body position.

## 2022-07-08 NOTE — Assessment & Plan Note (Signed)
Continue baclofen 

## 2022-07-10 ENCOUNTER — Other Ambulatory Visit: Payer: Self-pay

## 2022-07-10 MED ORDER — VITAMIN D (ERGOCALCIFEROL) 1.25 MG (50000 UNIT) PO CAPS: 50000.0000 [IU] | ORAL_CAPSULE | ORAL | 2 refills | Status: AC

## 2022-07-12 ENCOUNTER — Telehealth: Payer: Self-pay

## 2022-07-12 DIAGNOSIS — L304 Erythema intertrigo: Secondary | ICD-10-CM

## 2022-07-12 MED ORDER — FLUCONAZOLE 100 MG PO TABS: 100.0000 mg | ORAL_TABLET | Freq: Every day | ORAL | 0 refills | Status: DC

## 2022-07-12 NOTE — Telephone Encounter (Signed)
Bradley [name] called to report that Bradley [name] has a recurrent yeast infection in the groin area.  Diflucan has worked very well for him in the past.  Dr. Tobie Poet advised Diflucan 100 mg daily for 7 days.

## 2022-07-17 DIAGNOSIS — G800 Spastic quadriplegic cerebral palsy: Secondary | ICD-10-CM | POA: Diagnosis not present

## 2022-07-17 DIAGNOSIS — R2689 Other abnormalities of gait and mobility: Secondary | ICD-10-CM | POA: Diagnosis not present

## 2022-08-02 ENCOUNTER — Ambulatory Visit (AMBULATORY_SURGERY_CENTER): Payer: Self-pay

## 2022-08-02 ENCOUNTER — Other Ambulatory Visit: Payer: Self-pay

## 2022-08-02 VITALS — Ht 60.0 in | Wt 120.0 lb

## 2022-08-02 DIAGNOSIS — R933 Abnormal findings on diagnostic imaging of other parts of digestive tract: Secondary | ICD-10-CM

## 2022-08-02 MED ORDER — PEG 3350-KCL-NA BICARB-NACL 420 G PO SOLR: 4000.0000 mL | Freq: Once | ORAL | 0 refills | Status: AC

## 2022-08-02 NOTE — Progress Notes (Signed)
Denies allergies to eggs or soy products. Denies complication of anesthesia or sedation. Denies use of weight loss medication. Denies use of O2.   Emmi instructions given for colonoscopy.  

## 2022-08-06 ENCOUNTER — Telehealth: Payer: Self-pay

## 2022-08-06 ENCOUNTER — Other Ambulatory Visit: Payer: Self-pay | Admitting: Family Medicine

## 2022-08-06 MED ORDER — FLUCONAZOLE 100 MG PO TABS: 100.0000 mg | ORAL_TABLET | Freq: Every day | ORAL | 1 refills | Status: DC

## 2022-08-06 NOTE — Telephone Encounter (Signed)
Patient's mother called stating that the patient has a yeast infection on his skin affecting his groin area and penis. She is wanting to know if you could call something in for him to get rid of the yeast? Please advise.

## 2022-08-07 NOTE — Telephone Encounter (Signed)
Patient's Mother was informed.

## 2022-08-10 ENCOUNTER — Encounter: Payer: Self-pay | Admitting: Family Medicine

## 2022-08-10 ENCOUNTER — Ambulatory Visit (INDEPENDENT_AMBULATORY_CARE_PROVIDER_SITE_OTHER): Payer: Medicare Other | Admitting: Family Medicine

## 2022-08-10 VITALS — BP 110/70 | HR 80 | Temp 97.2°F | Resp 16

## 2022-08-10 DIAGNOSIS — M25561 Pain in right knee: Secondary | ICD-10-CM | POA: Diagnosis not present

## 2022-08-10 NOTE — Patient Instructions (Addendum)
Start on naproxen 220 mg 2 pills twice a day.  Can use tylenol as directed on bottle.   Follow up in 2-3 weeks if not improving and will consider xray and knee injection.

## 2022-08-10 NOTE — Assessment & Plan Note (Signed)
Start on naproxen 220 mg 2 pills twice a day.  Can use tylenol as directed on bottle.

## 2022-08-10 NOTE — Progress Notes (Signed)
Acute Office Visit  Subjective:    Patient ID: Bradley [name], male    DOB: Feb 18, 1976, 46 y.o.   MRN: [id number]  Chief Complaint  Patient presents with   Knee Pain    HPI: Patient is in today for right knee pain and swelling which started about two weeks ago.  He has increased swelling over the last 48 hours.    Past Medical History:  Diagnosis Date   Allergy    Blood transfusion without reported diagnosis    Cerebral palsy, hemiplegic (HCC)    Chronic idiopathic constipation    Fracture    4 fractured riibs   Neuromuscular disorder (HCC)    Seizure disorder (HCC)     Past Surgical History:  Procedure Laterality Date   ADENOIDECTOMY     CHOLECYSTECTOMY     cranial-peritoneal shunt     TONSILLECTOMY      Family History  Problem Relation Age of Onset   Hyperlipidemia Mother    Hypertension Mother    Ulcerative colitis Mother    Diabetes Mother    Migraines Mother    Osteoarthritis Mother    Diabetes Father    Hypertension Father    Migraines Sister    Diabetes Maternal Uncle    Breast cancer Maternal Grandmother    Heart attack Maternal Grandmother    Diabetes Maternal Grandfather    Heart attack Maternal Great-grandfather    Cancer Other        Prostate and Breast   Colon cancer Neg Hx    Esophageal cancer Neg Hx    Rectal cancer Neg Hx    Stomach cancer Neg Hx     Social History   Socioeconomic History   Marital status: Single    Spouse name: Not on file   Number of children: 0   Years of education: Not on file   Highest education level: Not on file  Occupational History   Occupation: Disabled  Tobacco Use   Smoking status: Never   Smokeless tobacco: Never  Vaping Use   Vaping Use: Never used  Substance and Sexual Activity   Alcohol use: Never   Drug use: Never   Sexual activity: Never  Other Topics Concern   Not on file  Social History Narrative   Not on file   Social Determinants of Health   Financial Resource Strain: Low Risk   (01/24/2022)   Overall Financial Resource Strain (CARDIA)    Difficulty of Paying Living Expenses: Not hard at all  Food Insecurity: No Food Insecurity (01/24/2022)   Hunger Vital Sign    Worried About Running Out of Food in the Last Year: Never true    Ran Out of Food in the Last Year: Never true  Transportation Needs: No Transportation Needs (01/24/2022)   PRAPARE - Hydrologist (Medical): No    Lack of Transportation (Non-Medical): No  Physical Activity: Inactive (01/24/2022)   Exercise Vital Sign    Days of Exercise per Week: 0 days    Minutes of Exercise per Session: 0 min  Stress: No Stress Concern Present (01/24/2022)   [place]    Feeling of Stress : Not at all  Social Connections: Unknown (01/24/2022)   Social Connection and Isolation Panel [NHANES]    Frequency of Communication with Friends and Family: Not on file    Frequency of Social Gatherings with Friends and Family: Not on file    Attends  Religious Services: Not on file    Active Member of Clubs or Organizations: No    Attends Club or Organization Meetings: Never    Marital Status: Never married  Intimate Partner Violence: Not At Risk (01/24/2022)   Humiliation, Afraid, Rape, and Kick questionnaire    Fear of Current or Ex-Partner: No    Emotionally Abused: No    Physically Abused: No    Sexually Abused: No    Outpatient Medications Prior to Visit  Medication Sig Dispense Refill   fluconazole (DIFLUCAN) 100 MG tablet Take 1 tablet (100 mg total) by mouth daily. 7 tablet 1   Vitamin D, Ergocalciferol, (DRISDOL) 1.25 MG (50000 UNIT) CAPS capsule Take 1 capsule (50,000 Units total) by mouth every 7 (seven) days. 5 capsule 2   acetaminophen (TYLENOL) 500 MG tablet Take 500-1,000 mg by mouth as needed.     baclofen (LIORESAL) 20 MG tablet Take 1 tablet (20 mg total) by mouth 4 (four) times daily. 360 each 1   chlorhexidine  (PERIDEX) 0.12 % solution Use as directed 15 mLs in the mouth or throat 2 (two) times daily. 120 mL 5   loratadine (KLS ALLERCLEAR) 10 MG tablet Take 10 mg by mouth as needed for allergies.     polyethylene glycol powder (GLYCOLAX/MIRALAX) 17 GM/SCOOP powder Take 17 g by mouth daily.     ofloxacin (OCUFLOX) 0.3 % ophthalmic solution Apply to eye. (Patient not taking: Reported on 08/02/2022)     omeprazole (PRILOSEC) 40 MG capsule Take 1 capsule (40 mg total) by mouth in the morning and at bedtime. (Patient not taking: Reported on 08/02/2022) 60 capsule 1   tamsulosin (FLOMAX) 0.4 MG CAPS capsule Take 1 capsule (0.4 mg total) by mouth daily after supper. (Patient not taking: Reported on 08/02/2022) 30 capsule 3   No facility-administered medications prior to visit.    Allergies  Allergen Reactions   Morphine Other (See Comments)    Other reaction(s): Unknown Had a hard time waking up after surgery. Had a hard time waking up after surgery.     Review of Systems  Constitutional:  Negative for chills, fatigue and fever.  Respiratory:  Negative for cough.   Cardiovascular:  Negative for chest pain.  Musculoskeletal:  Positive for arthralgias (right knee pain and swelling).  Neurological:  Negative for headaches.  Psychiatric/Behavioral:  Negative for dysphoric mood. The patient is not nervous/anxious.        Objective:    Physical Exam Vitals reviewed.  Musculoskeletal:        General: Swelling (left knee. mild increase warmth. no increased redness.) present. No tenderness.  Skin:    Comments: Intertrigo inguinal regions BL.   Neurological:     Mental Status: He is alert.     BP 110/70   Pulse 80   Temp (!) 97.2 F (36.2 C)   Resp 16  Wt Readings from Last 3 Encounters:  08/02/22 120 lb (54.4 kg)  07/05/22 120 lb (54.4 kg)    Health Maintenance Due  Topic Date Due   TETANUS/TDAP  Never done   COLONOSCOPY (Pts 45-7yr Insurance coverage will need to be confirmed)  Never  done   COVID-19 Vaccine (3 - Moderna series) 03/22/2021   INFLUENZA VACCINE  05/01/2022    There are no preventive care reminders to display for this patient.   Lab Results  Component Value Date   TSH 0.373 (L) 01/02/2021   Lab Results  Component Value Date   WBC 4.3 07/05/2022  HGB 14.0 07/05/2022   HCT 40.9 07/05/2022   MCV 91 07/05/2022   PLT 399 07/05/2022   Lab Results  Component Value Date   NA 138 07/05/2022   K 4.3 07/05/2022   CO2 23 07/05/2022   GLUCOSE 93 07/05/2022   BUN 11 07/05/2022   CREATININE 0.55 (L) 07/05/2022   BILITOT 0.3 07/05/2022   ALKPHOS 107 07/05/2022   AST 13 07/05/2022   ALT 21 07/05/2022   PROT 6.8 07/05/2022   ALBUMIN 4.3 07/05/2022   CALCIUM 9.6 07/05/2022   EGFR 122 07/05/2022   Lab Results  Component Value Date   CHOL 166 07/12/2021   Lab Results  Component Value Date   HDL 64 07/12/2021   Lab Results  Component Value Date   LDLCALC 92 07/12/2021   Lab Results  Component Value Date   TRIG 47 07/12/2021   Lab Results  Component Value Date   CHOLHDL 2.6 07/12/2021   No results found for: "HGBA1C"     Assessment & Plan:   Problem List Items Addressed This Visit       Other   Acute pain of right knee - Primary    Start on naproxen 220 mg 2 pills twice a day.  Can use tylenol as directed on bottle.       Relevant Orders   CBC with Differential/Platelet   Uric acid   No orders of the defined types were placed in this encounter.   Orders Placed This Encounter  Procedures   CBC with Differential/Platelet   Uric acid     Follow-up: Return if symptoms worsen or fail to improve.  An After Visit Summary was printed and given to the patient.  Rochel Brome, MD  Family Practice [phone]

## 2022-08-11 LAB — CBC WITH DIFFERENTIAL/PLATELET
Basophils Absolute: 0 10*3/uL (ref 0.0–0.2)
Basos: 0 %
EOS (ABSOLUTE): 0 10*3/uL (ref 0.0–0.4)
Eos: 0 %
Hematocrit: 39.3 % (ref 37.5–51.0)
Hemoglobin: 13.2 g/dL (ref 13.0–17.7)
Immature Grans (Abs): 0 10*3/uL (ref 0.0–0.1)
Immature Granulocytes: 0 %
Lymphocytes Absolute: 1.1 10*3/uL (ref 0.7–3.1)
Lymphs: 10 %
MCH: 30.6 pg (ref 26.6–33.0)
MCHC: 33.6 g/dL (ref 31.5–35.7)
MCV: 91 fL (ref 79–97)
Monocytes Absolute: 0.7 10*3/uL (ref 0.1–0.9)
Monocytes: 6 %
Neutrophils Absolute: 9.6 10*3/uL — ABNORMAL HIGH (ref 1.4–7.0)
Neutrophils: 84 %
Platelets: 422 10*3/uL (ref 150–450)
RBC: 4.31 x10E6/uL (ref 4.14–5.80)
RDW: 12.4 % (ref 11.6–15.4)
WBC: 11.5 10*3/uL — ABNORMAL HIGH (ref 3.4–10.8)

## 2022-08-11 LAB — URIC ACID: Uric Acid: 3 mg/dL — ABNORMAL LOW (ref 3.8–8.4)

## 2022-08-15 ENCOUNTER — Ambulatory Visit (INDEPENDENT_AMBULATORY_CARE_PROVIDER_SITE_OTHER): Payer: Medicare Other | Admitting: Family Medicine

## 2022-08-15 VITALS — BP 100/60 | Temp 96.9°F | Resp 16

## 2022-08-15 DIAGNOSIS — L304 Erythema intertrigo: Secondary | ICD-10-CM | POA: Diagnosis not present

## 2022-08-15 DIAGNOSIS — M25561 Pain in right knee: Secondary | ICD-10-CM

## 2022-08-15 NOTE — Progress Notes (Signed)
Acute Office Visit  Subjective:    Patient ID: Bradley [name], male    DOB: [birth year], 46 y.o.   MRN: [id number]  Chief Complaint  Patient presents with   Knee Pain    Right     HPI: Patient is in today for recheck of right knee pain and swelling.  He has seen some mild improvement in his pain but he is unable to bear weight on that leg.    Past Medical History:  Diagnosis Date   Allergy    Blood transfusion without reported diagnosis    Cerebral palsy, hemiplegic (HCC)    Chronic idiopathic constipation    Fracture    4 fractured riibs   Neuromuscular disorder (HCC)    Seizure disorder (HCC)     Past Surgical History:  Procedure Laterality Date   ADENOIDECTOMY     CHOLECYSTECTOMY     cranial-peritoneal shunt     TONSILLECTOMY      Family History  Problem Relation Age of Onset   Hyperlipidemia Mother    Hypertension Mother    Ulcerative colitis Mother    Diabetes Mother    Migraines Mother    Osteoarthritis Mother    Diabetes Father    Hypertension Father    Migraines Sister    Diabetes Maternal Uncle    Breast cancer Maternal Grandmother    Heart attack Maternal Grandmother    Diabetes Maternal Grandfather    Heart attack Maternal Great-grandfather    Cancer Other        Prostate and Breast   Colon cancer Neg Hx    Esophageal cancer Neg Hx    Rectal cancer Neg Hx    Stomach cancer Neg Hx     Social History   Socioeconomic History   Marital status: Single    Spouse name: Not on file   Number of children: 0   Years of education: Not on file   Highest education level: Not on file  Occupational History   Occupation: Disabled  Tobacco Use   Smoking status: Never   Smokeless tobacco: Never  Vaping Use   Vaping Use: Never used  Substance and Sexual Activity   Alcohol use: Never   Drug use: Never   Sexual activity: Never  Other Topics Concern   Not on file  Social History Narrative   Not on file   Social Determinants of Health    Financial Resource Strain: Low Risk  (01/24/2022)   Overall Financial Resource Strain (CARDIA)    Difficulty of Paying Living Expenses: Not hard at all  Food Insecurity: No Food Insecurity (01/24/2022)   Hunger Vital Sign    Worried About Running Out of Food in the Last Year: Never true    Ran Out of Food in the Last Year: Never true  Transportation Needs: No Transportation Needs (01/24/2022)   PRAPARE - Hydrologist (Medical): No    Lack of Transportation (Non-Medical): No  Physical Activity: Inactive (01/24/2022)   Exercise Vital Sign    Days of Exercise per Week: 0 days    Minutes of Exercise per Session: 0 min  Stress: No Stress Concern Present (01/24/2022)   [place]    Feeling of Stress : Not at all  Social Connections: Unknown (01/24/2022)   Social Connection and Isolation Panel [NHANES]    Frequency of Communication with Friends and Family: Not on file    Frequency of Social Gatherings  with Friends and Family: Not on file    Attends Religious Services: Not on file    Active Member of Clubs or Organizations: No    Attends Club or Organization Meetings: Never    Marital Status: Never married  Intimate Partner Violence: Not At Risk (01/24/2022)   Humiliation, Afraid, Rape, and Kick questionnaire    Fear of Current or Ex-Partner: No    Emotionally Abused: No    Physically Abused: No    Sexually Abused: No    Outpatient Medications Prior to Visit  Medication Sig Dispense Refill   fluconazole (DIFLUCAN) 100 MG tablet Take 1 tablet (100 mg total) by mouth daily. 7 tablet 1   acetaminophen (TYLENOL) 500 MG tablet Take 500-1,000 mg by mouth as needed.     baclofen (LIORESAL) 20 MG tablet Take 1 tablet (20 mg total) by mouth 4 (four) times daily. 360 each 1   chlorhexidine (PERIDEX) 0.12 % solution Use as directed 15 mLs in the mouth or throat 2 (two) times daily. 120 mL 5   loratadine  (KLS ALLERCLEAR) 10 MG tablet Take 10 mg by mouth as needed for allergies.     polyethylene glycol powder (GLYCOLAX/MIRALAX) 17 GM/SCOOP powder Take 17 g by mouth daily.     Vitamin D, Ergocalciferol, (DRISDOL) 1.25 MG (50000 UNIT) CAPS capsule Take 1 capsule (50,000 Units total) by mouth every 7 (seven) days. 5 capsule 2   No facility-administered medications prior to visit.    Allergies  Allergen Reactions   Morphine Other (See Comments)    Other reaction(s): Unknown Had a hard time waking up after surgery. Had a hard time waking up after surgery.     Review of Systems  Constitutional:  Negative for chills and fever.  Musculoskeletal:  Positive for arthralgias (right knee pain and swelling).       Objective:    Physical Exam Vitals reviewed.  Constitutional:      Appearance: Normal appearance.  Musculoskeletal:        General: Tenderness (rt knee. neg patella apprehension. Mild edema. no erythema.) present.  Skin:    Findings: Rash (intertrigo in rt antecubital space.) present.  Neurological:     Mental Status: He is alert.     BP 100/60   Temp (!) 96.9 F (36.1 C)   Resp 16  Wt Readings from Last 3 Encounters:  08/02/22 120 lb (54.4 kg)  07/05/22 120 lb (54.4 kg)    Health Maintenance Due  Topic Date Due   COLONOSCOPY (Pts 45-33yr Insurance coverage will need to be confirmed)  Never done   INFLUENZA VACCINE  05/01/2022   COVID-19 Vaccine (4 - 2023-24 season) 06/01/2022    There are no preventive care reminders to display for this patient.   Lab Results  Component Value Date   TSH 0.373 (L) 01/02/2021   Lab Results  Component Value Date   WBC 11.5 (H) 08/10/2022   HGB 13.2 08/10/2022   HCT 39.3 08/10/2022   MCV 91 08/10/2022   PLT 422 08/10/2022   Lab Results  Component Value Date   NA 138 07/05/2022   K 4.3 07/05/2022   CO2 23 07/05/2022   GLUCOSE 93 07/05/2022   BUN 11 07/05/2022   CREATININE 0.55 (L) 07/05/2022   BILITOT 0.3 07/05/2022    ALKPHOS 107 07/05/2022   AST 13 07/05/2022   ALT 21 07/05/2022   PROT 6.8 07/05/2022   ALBUMIN 4.3 07/05/2022   CALCIUM 9.6 07/05/2022   EGFR 122 07/05/2022  Lab Results  Component Value Date   CHOL 166 07/12/2021   Lab Results  Component Value Date   HDL 64 07/12/2021   Lab Results  Component Value Date   LDLCALC 92 07/12/2021   Lab Results  Component Value Date   TRIG 47 07/12/2021   Lab Results  Component Value Date   CHOLHDL 2.6 07/12/2021   No results found for: "HGBA1C"     Assessment & Plan:   Problem List Items Addressed This Visit       Musculoskeletal and Integument   Intertrigo    Rx: fluconazole.        Other   Acute pain of right knee - Primary    Risks were discussed including bleeding, infection, increase in sugars if diabetic, atrophy at site of injection, and increased pain.  After consent was obtained, using sterile technique the rightknee was prepped with alcohol.  Using lidocaine a wheal was made and an 18 G needle was used to enter the joint. Unfortunately, no fluid was drawn off. A second 22 G 1 1/2 inch needle was used and Kenalog 80 mg and 5 ml plain Lidocaine was then injected and the needle withdrawn.  The procedure was well tolerated.   The patient is asked to continue to rest the joint for a few more days before resuming regular activities.  It may be more painful for the first 1-2 days.  Watch for fever, or increased swelling or persistent pain in the joint. Call or return to clinic prn if such symptoms occur or there is failure to improve as anticipated.       Relevant Medications   triamcinolone acetonide (KENALOG-40) injection 80 mg (Start on 08/21/2022  6:00 PM)   Follow-up: Return if symptoms worsen or fail to improve.  An After Visit Summary was printed and given to the patient.  Rochel Brome, MD  Family Practice [phone]

## 2022-08-21 ENCOUNTER — Encounter: Payer: Self-pay | Admitting: Family Medicine

## 2022-08-21 MED ORDER — TRIAMCINOLONE ACETONIDE 40 MG/ML IJ SUSP: 80.0000 mg | Freq: Once | INTRAMUSCULAR | Status: DC

## 2022-08-21 NOTE — Assessment & Plan Note (Signed)
Rx fluconazole

## 2022-08-21 NOTE — Assessment & Plan Note (Signed)
Risks were discussed including bleeding, infection, increase in sugars if diabetic, atrophy at site of injection, and increased pain.  After consent was obtained, using sterile technique the rightknee was prepped with alcohol.  Using lidocaine a wheal was made and an 18 G needle was used to enter the joint. Unfortunately, no fluid was drawn off. A second 22 G 1 1/2 inch needle was used and Kenalog 80 mg and 5 ml plain Lidocaine was then injected and the needle withdrawn.  The procedure was well tolerated.   The patient is asked to continue to rest the joint for a few more days before resuming regular activities.  It may be more painful for the first 1-2 days.  Watch for fever, or increased swelling or persistent pain in the joint. Call or return to clinic prn if such symptoms occur or there is failure to improve as anticipated.

## 2022-08-30 ENCOUNTER — Telehealth: Payer: Self-pay | Admitting: Gastroenterology

## 2022-08-30 NOTE — Telephone Encounter (Signed)
Spoke with patient's mother and she would like to cancel patient's procedure, states they need to think it over some more & would prefer to call back at a later time to reschedule. Hospital scheduling notified.

## 2022-08-30 NOTE — Telephone Encounter (Signed)
Inbound call from patient mother wanting to cancel procedure with Dr.Beavers at [facility]. She is requesting a call back to confirm it was cancelled.

## 2022-09-10 ENCOUNTER — Encounter (HOSPITAL_COMMUNITY): Admission: RE | Payer: Self-pay | Source: Ambulatory Visit

## 2022-09-10 ENCOUNTER — Ambulatory Visit (HOSPITAL_COMMUNITY): Admission: RE | Admit: 2022-09-10 | Payer: Medicare Other | Source: Ambulatory Visit | Admitting: Gastroenterology

## 2022-09-10 SURGERY — COLONOSCOPY WITH PROPOFOL
Anesthesia: Monitor Anesthesia Care

## 2022-09-19 DIAGNOSIS — G919 Hydrocephalus, unspecified: Secondary | ICD-10-CM | POA: Diagnosis not present

## 2022-09-19 DIAGNOSIS — R5383 Other fatigue: Secondary | ICD-10-CM | POA: Diagnosis not present

## 2022-09-19 DIAGNOSIS — Z79899 Other long term (current) drug therapy: Secondary | ICD-10-CM | POA: Diagnosis not present

## 2022-09-19 DIAGNOSIS — G808 Other cerebral palsy: Secondary | ICD-10-CM | POA: Diagnosis not present

## 2022-09-19 DIAGNOSIS — R739 Hyperglycemia, unspecified: Secondary | ICD-10-CM | POA: Diagnosis not present

## 2022-09-19 DIAGNOSIS — Z125 Encounter for screening for malignant neoplasm of prostate: Secondary | ICD-10-CM | POA: Diagnosis not present

## 2022-09-19 DIAGNOSIS — M4130 Thoracogenic scoliosis, site unspecified: Secondary | ICD-10-CM | POA: Insufficient documentation

## 2022-09-26 ENCOUNTER — Emergency Department: Payer: Medicare Other

## 2022-09-26 ENCOUNTER — Emergency Department
Admission: EM | Admit: 2022-09-26 | Discharge: 2022-09-26 | Disposition: A | Discharge status: Home / Self Care | Payer: Medicare Other | Attending: Emergency Medicine | Admitting: Emergency Medicine

## 2022-09-26 ENCOUNTER — Other Ambulatory Visit: Payer: Self-pay

## 2022-09-26 DIAGNOSIS — S199XXA Unspecified injury of neck, initial encounter: Secondary | ICD-10-CM | POA: Diagnosis not present

## 2022-09-26 DIAGNOSIS — S0083XA Contusion of other part of head, initial encounter: Secondary | ICD-10-CM | POA: Diagnosis not present

## 2022-09-26 DIAGNOSIS — W19XXXA Unspecified fall, initial encounter: Secondary | ICD-10-CM

## 2022-09-26 DIAGNOSIS — H109 Unspecified conjunctivitis: Secondary | ICD-10-CM | POA: Diagnosis not present

## 2022-09-26 DIAGNOSIS — W050XXA Fall from non-moving wheelchair, initial encounter: Secondary | ICD-10-CM | POA: Diagnosis not present

## 2022-09-26 DIAGNOSIS — Z23 Encounter for immunization: Secondary | ICD-10-CM | POA: Diagnosis not present

## 2022-09-26 DIAGNOSIS — S022XXA Fracture of nasal bones, initial encounter for closed fracture: Secondary | ICD-10-CM | POA: Diagnosis not present

## 2022-09-26 DIAGNOSIS — H1033 Unspecified acute conjunctivitis, bilateral: Secondary | ICD-10-CM | POA: Diagnosis not present

## 2022-09-26 DIAGNOSIS — M419 Scoliosis, unspecified: Secondary | ICD-10-CM | POA: Diagnosis not present

## 2022-09-26 DIAGNOSIS — S0181XA Laceration without foreign body of other part of head, initial encounter: Secondary | ICD-10-CM | POA: Diagnosis not present

## 2022-09-26 MED ORDER — OFLOXACIN 0.3 % OP SOLN: 2.0000 [drp] | Freq: Four times a day (QID) | OPHTHALMIC | 0 refills | Status: AC

## 2022-09-26 MED ORDER — CEPHALEXIN 500 MG PO CAPS: 500.0000 mg | ORAL_CAPSULE | Freq: Four times a day (QID) | ORAL | 0 refills | Status: DC

## 2022-09-26 MED ORDER — TETANUS-DIPHTH-ACELL PERTUSSIS 5-2.5-18.5 LF-MCG/0.5 IM SUSY
0.5000 mL | PREFILLED_SYRINGE | Freq: Once | INTRAMUSCULAR | Status: AC
  Administered 2022-09-26: 0.5 mL via INTRAMUSCULAR
  Filled 2022-09-26: qty 0.5

## 2022-09-26 NOTE — ED Triage Notes (Addendum)
Pt comes from home via POV c/o fall this morning. Pt states he fell out of wheelchair and fell face down onto floor. Pt has laceration on forehead that was bleeding, not bleeding at this time, and nose bleed. Denies blood thinners. Mother denies LOC. Hx of cerebral palsy

## 2022-09-26 NOTE — Discharge Instructions (Addendum)
As we discussed, although Bradley [name] has a laceration to his forehead and nasal bone fractures, he has no emergent injuries that require additional stay in the hospital.  Please follow-up with his PCP and with the ENT clinic at the number and address listed in this paperwork.  Please read through all of the included information including the information about managing nasal fractures.  It would be good to avoid having him blow his nose for at least a week.  We sent a prescription for antibiotics to his pharmacy to help prevent infection within the sinus passages.  Follow-up as an outpatient at the next available opportunity.  Insert return statement.  Notes from [place]:  -The Dermabond glue solution will dissolve on its own in 5 to 7 days.  It is waterproof, however he will dissolve prematurely under high-pressure water, lotions, oils, or emollients.  -He is also found to have some bacterial conjunctivitis.  Please have him take the full course of the ofloxacin drops as prescribed.  -Return to the emergency department anytime if you begin to experience any new or worsening symptoms.  -For pain, he may take 1000 mg Acetaminophen every 6 hours or 400 mg ibuprofen every 6 hours.

## 2022-09-26 NOTE — ED Provider Notes (Signed)
   [facility] Provider Note    Event Date/Time   First MD Initiated Contact with Patient 09/26/22 1208     (approximate)   History   Chief Complaint Fall   HPI Bradley [name] is a 46 y.o. male, history of developmental delay, spastic quadriplegic cerebral palsy, seizure disorder, presents to the emergency department for evaluation of fall.  He reportedly fell out of his wheelchair and fell face down onto the floor.  Patient was seen and evaluated by Dr. York Cerise.  I performed laceration repair on this patient.  See below for details.  On examination, additionally discovered purulent drainage from both eyes bilaterally with erythema across the sclera, consistent with bacterial conjunctivitis.  Provided him with a prescription for ofloxacin.  History Limitations: Developmental delay.        PROCEDURES:  Critical Care performed: N/A.  Marland Kitchen.Laceration Repair  Date/Time: 09/26/2022 7:13 PM  Performed by: Varney Daily, PA Authorized by: Varney Daily, PA   Consent:    Consent obtained:  Verbal   Consent given by:  Patient and guardian   Risks, benefits, and alternatives were discussed: yes     Risks discussed:  Infection, need for additional repair, nerve damage, poor wound healing, poor cosmetic result, pain, retained foreign body, vascular damage and tendon damage   Alternatives discussed:  No treatment Universal protocol:    Patient identity confirmed:  Verbally with patient Anesthesia:    Anesthesia method:  None Laceration details:    Location:  Face   Face location:  Forehead   Length (cm):  2   Depth (mm):  1 Pre-procedure details:    Preparation:  Patient was prepped and draped in usual sterile fashion Exploration:    Hemostasis achieved with:  Direct pressure   Wound extent: areolar tissue not violated, fascia not violated, no foreign body, no signs of injury, no nerve damage, no tendon damage, no underlying fracture and no  vascular damage     Contaminated: no   Treatment:    Area cleansed with:  Saline   Amount of cleaning:  Standard   Irrigation solution:  Sterile saline   Irrigation volume:  500 ml   Irrigation method:  Syringe   Debridement:  None   Undermining:  None Skin repair:    Repair method:  Tissue adhesive Approximation:    Approximation:  Close Repair type:    Repair type:  Simple Post-procedure details:    Dressing:  Open (no dressing)   Procedure completion:  Tolerated well, no immediate complications     FINAL CLINICAL IMPRESSION(S) / ED DIAGNOSES   Final diagnoses:  Fall, initial encounter  Forehead contusion, initial encounter  Laceration of forehead, initial encounter  Closed fracture of nasal bone, initial encounter  Conjunctivitis of both eyes, unspecified conjunctivitis type     Rx / DC Orders   ED Discharge Orders          Ordered    cephALEXin (KEFLEX) 500 MG capsule  4 times daily        09/26/22 1211    ofloxacin (OCUFLOX) 0.3 % ophthalmic solution  4 times daily        09/26/22 1234             Note:  This document was prepared using Dragon voice recognition software and may include unintentional dictation errors.   Varney Daily, [place] 09/26/22 1610    Minna Antis, MD 09/27/22 [phone]

## 2022-09-26 NOTE — ED Provider Notes (Signed)
[facility] Provider Note    Event Date/Time   First MD Initiated Contact with Patient 09/26/22 1208     (approximate)   History   Fall   HPI  Bradley [name] is a 46 y.o. 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  He presents with his sister and mother for evaluation after a fall.  Reportedly he fell out of his wheelchair and struck the front of his face on the floor causing a laceration and bleeding.  He is not on any blood thinners.  His mother reports that the patient did not lose consciousness.  He is in no distress currently but there is a great deal of dried blood on his face and his family just wanted to make sure that everything was okay.  They do not know the date of his last tetanus vaccination.     Physical Exam   Triage Vital Signs: ED Triage Vitals  Enc Vitals Group     BP 09/26/22 0654 (!) 162/100     Pulse Rate 09/26/22 0654 96     Resp 09/26/22 0654 20     Temp 09/26/22 0654 98.5 F (36.9 C)     Temp Source 09/26/22 0654 Oral     SpO2 09/26/22 0654 97 %     Weight 09/26/22 0652 40.4 kg (89 lb)     Height --      Head Circumference --      Peak Flow --      Pain Score --      Pain Loc --      Pain Edu? --      Excl. in GC? --     Most recent vital signs: Vitals:   09/26/22 0654  BP: (!) 162/100  Pulse: 96  Resp: 20  Temp: 98.5 F (36.9 C)  SpO2: 97%     General: Patient is awake and alert and communicating at his baseline per family.  He does not appear to be in distress although he has extensive dried blood on his face. CV:  Good peripheral perfusion.  Regular rate and rhythm. Resp:  Normal effort.  No accessory muscle usage or intercostal retractions. Abd:  No distention.  Other:  Chronic contractions due to his spastic quadriplegic cerebral palsy.  He has a laceration in the lower middle of his forehead that is approximately 1 cm in length and horizontal  with no complicating factors.  It is mildly tender to palpation.  His nose is also swollen consistent with the findings on the CT scan (bilateral nasal fractures).  No epistaxis at this time.   ED Results / Procedures / Treatments   Labs (all labs ordered are listed, but only abnormal results are displayed) Labs Reviewed - No data to display    RADIOLOGY I viewed and interpreted the patient's head and cervical spine CT.  He has nasal fractures and no VP shunt but no other evidence of acute injury.  Radiologist reports confirmed these findings.    PROCEDURES:  Critical Care performed: No  Procedures   MEDICATIONS ORDERED IN ED: Medications  Tdap (BOOSTRIX) injection 0.5 mL (0.5 mLs Intramuscular Given 09/26/22 1238)     IMPRESSION / MDM / ASSESSMENT AND PLAN / ED COURSE  I reviewed the triage vital signs and the nursing notes.  Differential diagnosis includes, but is not limited to, skull fracture, facial fractures, laceration, infection, electrolyte or metabolic abnormality.  Patient's presentation is most consistent with acute presentation with potential threat to life or bodily function.  Labs/studies ordered: Cervical spine CT, head CT.  Interventions ordered: Tdap  .  I evaluated the patient and I am reassured that there is no evidence of an acute or emergent condition requiring him to be hospitalized or requiring additional workup.  I spoke with his mother and sister and the patient about the findings and about how we can fix the laceration and get them discharge.  They all agree with this plan and the patient said thank you.    I am transferring ED care to [facility], [place], who will further manage the patient including the laceration repair.  I prepared the patient's discharge instructions and wrote a prescription for Keflex for prophylactic antibiotics for the nasal fractures, and Bradley [name] will add any additional orders and  follow-up information that he feels is necessary based on his laceration repair.  See his additional note for details.      FINAL CLINICAL IMPRESSION(S) / ED DIAGNOSES   Final diagnoses:  Fall, initial encounter  Forehead contusion, initial encounter  Laceration of forehead, initial encounter  Closed fracture of nasal bone, initial encounter  Conjunctivitis of both eyes, unspecified conjunctivitis type     Rx / DC Orders   ED Discharge Orders          Ordered    cephALEXin (KEFLEX) 500 MG capsule  4 times daily        09/26/22 1211    ofloxacin (OCUFLOX) 0.3 % ophthalmic solution  4 times daily        09/26/22 1234             Note:  This document was prepared using Dragon voice recognition software and may include unintentional dictation errors.   Loleta Rose, MD 09/26/22 [phone]

## 2022-10-04 DIAGNOSIS — B372 Candidiasis of skin and nail: Secondary | ICD-10-CM | POA: Diagnosis not present

## 2022-10-04 DIAGNOSIS — G808 Other cerebral palsy: Secondary | ICD-10-CM | POA: Diagnosis not present

## 2022-10-04 DIAGNOSIS — H109 Unspecified conjunctivitis: Secondary | ICD-10-CM | POA: Diagnosis not present

## 2023-01-07 ENCOUNTER — Ambulatory Visit: Payer: Medicare Other | Admitting: Family Medicine

## 2023-01-28 ENCOUNTER — Ambulatory Visit: Payer: Medicare Other | Admitting: Family Medicine

## 2023-02-26 ENCOUNTER — Emergency Department: Payer: Medicare Other

## 2023-02-26 ENCOUNTER — Encounter: Payer: Self-pay | Admitting: Emergency Medicine

## 2023-02-26 ENCOUNTER — Emergency Department
Admission: EM | Admit: 2023-02-26 | Discharge: 2023-02-27 | Disposition: A | Discharge status: Home / Self Care | Payer: Medicare Other | Attending: Emergency Medicine | Admitting: Emergency Medicine

## 2023-02-26 ENCOUNTER — Other Ambulatory Visit: Payer: Self-pay

## 2023-02-26 DIAGNOSIS — R109 Unspecified abdominal pain: Secondary | ICD-10-CM | POA: Diagnosis not present

## 2023-02-26 DIAGNOSIS — N390 Urinary tract infection, site not specified: Secondary | ICD-10-CM | POA: Diagnosis not present

## 2023-02-26 DIAGNOSIS — R339 Retention of urine, unspecified: Secondary | ICD-10-CM | POA: Insufficient documentation

## 2023-02-26 DIAGNOSIS — K5900 Constipation, unspecified: Secondary | ICD-10-CM | POA: Diagnosis not present

## 2023-02-26 DIAGNOSIS — N281 Cyst of kidney, acquired: Secondary | ICD-10-CM | POA: Diagnosis not present

## 2023-02-26 DIAGNOSIS — M4135 Thoracogenic scoliosis, thoracolumbar region: Secondary | ICD-10-CM | POA: Diagnosis not present

## 2023-02-26 LAB — URINALYSIS, ROUTINE W REFLEX MICROSCOPIC
Bilirubin Urine: NEGATIVE
Glucose, UA: NEGATIVE mg/dL
Ketones, ur: 20 mg/dL — AB
Nitrite: NEGATIVE
Protein, ur: 100 mg/dL — AB
Specific Gravity, Urine: 1.024 (ref 1.005–1.030)
Squamous Epithelial / HPF: NONE SEEN /HPF (ref 0–5)
WBC, UA: >50 WBC/hpf (ref 0–5)
pH: 5 (ref 5.0–8.0)

## 2023-02-26 LAB — COMPREHENSIVE METABOLIC PANEL
ALT: 50 U/L — ABNORMAL HIGH (ref 0–44)
AST: 22 U/L (ref 15–41)
Albumin: 3.7 g/dL (ref 3.5–5.0)
Alkaline Phosphatase: 112 U/L (ref 38–126)
Anion gap: 10 (ref 5–15)
BUN: 15 mg/dL (ref 6–20)
CO2: 23 mmol/L (ref 22–32)
Calcium: 8.7 mg/dL — ABNORMAL LOW (ref 8.9–10.3)
Chloride: 97 mmol/L — ABNORMAL LOW (ref 98–111)
Creatinine, Ser: 0.39 mg/dL — ABNORMAL LOW (ref 0.61–1.24)
GFR, Estimated: >60 mL/min (ref >60)
Glucose, Bld: 94 mg/dL (ref 70–99)
Potassium: 3.5 mmol/L (ref 3.5–5.1)
Sodium: 130 mmol/L — ABNORMAL LOW (ref 135–145)
Total Bilirubin: 1.1 mg/dL (ref 0.3–1.2)
Total Protein: 7.4 g/dL (ref 6.5–8.1)

## 2023-02-26 LAB — CBC WITH DIFFERENTIAL/PLATELET
Abs Immature Granulocytes: 0.17 10*3/uL — ABNORMAL HIGH (ref 0.00–0.07)
Basophils Absolute: 0.1 10*3/uL (ref 0.0–0.1)
Basophils Relative: 0 %
Eosinophils Absolute: 0.1 10*3/uL (ref 0.0–0.5)
Eosinophils Relative: 0 %
HCT: 40.1 % (ref 39.0–52.0)
Hemoglobin: 13.5 g/dL (ref 13.0–17.0)
Immature Granulocytes: 1 %
Lymphocytes Relative: 8 %
Lymphs Abs: 1.6 10*3/uL (ref 0.7–4.0)
MCH: 30.5 pg (ref 26.0–34.0)
MCHC: 33.7 g/dL (ref 30.0–36.0)
MCV: 90.7 fL (ref 80.0–100.0)
Monocytes Absolute: 1 10*3/uL (ref 0.1–1.0)
Monocytes Relative: 5 %
Neutro Abs: 16 10*3/uL — ABNORMAL HIGH (ref 1.7–7.7)
Neutrophils Relative %: 86 %
Platelets: 307 10*3/uL (ref 150–400)
RBC: 4.42 MIL/uL (ref 4.22–5.81)
RDW: 12.1 % (ref 11.5–15.5)
WBC: 18.8 10*3/uL — ABNORMAL HIGH (ref 4.0–10.5)
nRBC: 0 % (ref 0.0–0.2)

## 2023-02-26 MED ORDER — SODIUM CHLORIDE 0.9 % IV SOLN
1.0000 g | Freq: Once | INTRAVENOUS | Status: AC
  Administered 2023-02-27: 1 g via INTRAVENOUS
  Filled 2023-02-26: qty 10

## 2023-02-26 MED ORDER — IOHEXOL 300 MG/ML  SOLN
80.0000 mL | Freq: Once | INTRAMUSCULAR | Status: AC | PRN
  Administered 2023-02-26: 80 mL via INTRAVENOUS

## 2023-02-26 NOTE — ED Notes (Signed)
Bladder scan 555

## 2023-02-26 NOTE — ED Notes (Addendum)
Patient has not been able to have a bowel movement in 4 days, and has not been able to urinate in 3 days. Per patients Mother. No distress noted at this time.

## 2023-02-26 NOTE — ED Provider Notes (Signed)
[facility] Provider Note  Patient Contact: 11:53 PM (approximate)   History   Urinary Retention   HPI  Bradley [name] is a 47 y.o. male who presents the emergency department complaining of urinary retention and constipation.  Patient has a cerebral palsy patient who is able to provide some history but his parents and sibling are also here to fill in details.  Patient with no fevers, chills.  He started having urinary symptoms 3 days ago, primary care was contacted and placed on antibiotics for likely UTI.  Antibiotics were picked up today but patient has not started.  No fevers at home.  Patient was also noted to have constipation for 3 days.  According to the family patient has had off and on and urinary issues for the past several months.  They believe that the patient may have a UTI as well as urinary retention as he has had decreased urinary output.  Patient was noted to have 540 mL of urine in his bladder on bladder scan in triage.     Physical Exam   Triage Vital Signs: ED Triage Vitals [02/26/23 1816]  Enc Vitals Group     BP 106/80     Pulse Rate (!) 110     Resp 18     Temp 98.3 F (36.8 C)     Temp Source Oral     SpO2 96 %     Weight      Height      Head Circumference      Peak Flow      Pain Score      Pain Loc      Pain Edu?      Excl. in GC?     Most recent vital signs: Vitals:   02/26/23 1816  BP: 106/80  Pulse: (!) 110  Resp: 18  Temp: 98.3 F (36.8 C)  SpO2: 96%     General: Alert and in no acute distress.   Cardiovascular:  Good peripheral perfusion Respiratory: Normal respiratory effort without tachypnea or retractions. Lungs CTAB. Good air entry to the bases with no decreased or absent breath sounds. Gastrointestinal: Bowel sounds 4 quadrants. Soft and nontender to palpation. No guarding or rigidity. No palpable masses. No distention. No CVA tenderness. Musculoskeletal: Full range of motion to all extremities.   Neurologic:  No gross focal neurologic deficits are appreciated.  Skin:   No rash noted Other:   ED Results / Procedures / Treatments   Labs (all labs ordered are listed, but only abnormal results are displayed) Labs Reviewed  URINALYSIS, ROUTINE W REFLEX MICROSCOPIC - Abnormal; Notable for the following components:      Result Value   Color, Urine AMBER (*)    APPearance CLOUDY (*)    Hgb urine dipstick SMALL (*)    Ketones, ur 20 (*)    Protein, ur 100 (*)    Leukocytes,Ua LARGE (*)    Bacteria, UA RARE (*)    Non Squamous Epithelial PRESENT (*)    All other components within normal limits  COMPREHENSIVE METABOLIC PANEL - Abnormal; Notable for the following components:   Sodium 130 (*)    Chloride 97 (*)    Creatinine, Ser 0.39 (*)    Calcium 8.7 (*)    ALT 50 (*)    All other components within normal limits  CBC WITH DIFFERENTIAL/PLATELET - Abnormal; Notable for the following components:   WBC 18.8 (*)    Neutro Abs 16.0 (*)  Abs Immature Granulocytes 0.17 (*)    All other components within normal limits  URINE CULTURE     EKG     RADIOLOGY  I personally viewed, evaluated, and interpreted these images as part of my medical decision making, as well as reviewing the written report by the radiologist.  ED Provider Interpretation: Constipation without other acute finding in the abdomen.  VP shunt is in place.  Catheter appears intact.  Spine reveals no evidence of impingement.  CT L-SPINE NO CHARGE  Result Date: 02/26/2023 CLINICAL DATA:  Urinary retention, constipation.  Abdominal pain. EXAM: CT LUMBAR SPINE WITHOUT CONTRAST TECHNIQUE: Multidetector CT imaging of the lumbar spine was performed without intravenous contrast administration. Multiplanar CT image reconstructions were also generated. RADIATION DOSE REDUCTION: This exam was performed according to the departmental dose-optimization program which includes automated exposure control, adjustment of the mA  and/or kV according to patient size and/or use of iterative reconstruction technique. COMPARISON:  CT abdomen and pelvis today and 01/29/2022 FINDINGS: Segmentation: 5 lumbar type vertebrae. Alignment: Severe convex rightward scoliosis.  No subluxation Vertebrae: No acute fracture or focal pathologic process. Paraspinal and other soft tissues: Negative. Disc levels: Degenerative spurring. Disc spaces maintained. No disc herniation. IMPRESSION: Severe convex rightward scoliosis in the thoracolumbar spine. Associated degenerative changes. No acute bony abnormality. Electronically Signed   By: Charlett Nose M.D.   On: 02/26/2023 23:21   CT ABDOMEN PELVIS W CONTRAST  Result Date: 02/26/2023 CLINICAL DATA:  Abdominal pain, acute, nonlocalized upper abd pain, VP shunt in place, urinary retention, constipation EXAM: CT ABDOMEN AND PELVIS WITH CONTRAST TECHNIQUE: Multidetector CT imaging of the abdomen and pelvis was performed using the standard protocol following bolus administration of intravenous contrast. RADIATION DOSE REDUCTION: This exam was performed according to the departmental dose-optimization program which includes automated exposure control, adjustment of the mA and/or kV according to patient size and/or use of iterative reconstruction technique. CONTRAST:  80mL OMNIPAQUE IOHEXOL 300 MG/ML  SOLN COMPARISON:  01/29/2022 FINDINGS: Lower chest: Bibasilar atelectasis.  No effusions. Hepatobiliary: No focal liver abnormality is seen. Status post cholecystectomy. No biliary dilatation. Pancreas: No focal abnormality or ductal dilatation. Spleen: No focal abnormality.  Normal size. Adrenals/Urinary Tract: Bilateral renal lower pole cysts, the largest in the right kidney measuring up to 5.4 cm, stable. No follow-up imaging recommended. No stones or hydronephrosis. Adrenal glands unremarkable. Urinary bladder decompressed with Foley catheter in place. Stomach/Bowel: Normal appendix. Moderate stool burden throughout  the colon. Stomach, large and small bowel grossly unremarkable. Vascular/Lymphatic: No evidence of aneurysm or adenopathy. Reproductive: Prostate enlargement Other: No free fluid or free air. VP shunt in place which terminates in the left mid abdomen. Musculoskeletal: No acute bony abnormality. IMPRESSION: No acute findings in the abdomen or pelvis. Moderate stool burden. VP shunt in place, terminating in the left mid abdomen. Catheter appears intact. Bibasilar atelectasis. Electronically Signed   By: Charlett Nose M.D.   On: 02/26/2023 23:12    PROCEDURES:  Critical Care performed: No  Procedures   MEDICATIONS ORDERED IN ED: Medications  cefTRIAXone (ROCEPHIN) 1 g in sodium chloride 0.9 % 100 mL IVPB (1 g Intravenous New Bag/Given 02/27/23 0002)  iohexol (OMNIPAQUE) 300 MG/ML solution 80 mL (80 mLs Intravenous Contrast Given 02/26/23 2246)     IMPRESSION / MDM / ASSESSMENT AND PLAN / ED COURSE  I reviewed the triage vital signs and the nursing notes.  Clinical Course as of 02/27/23 0004  Tue Feb 26, 2023  2257 Protein(!): 100 [DK]    Clinical Course User Index [DK] Earlie Server, Student-PA    Differential diagnosis includes, but is not limited to, cauda equina, lumbar impingement, UTI, pyelonephritis, nephrolithiasis   Patient's presentation is most consistent with acute presentation with potential threat to life or bodily function.   Patient's diagnosis is consistent with urinary retention, UTI, constipation.  Patient presented with constipation and urinary retention.  He had been prescribed some antibiotics for UTI but had not started them yet.  No fevers at home.  No other symptoms.  Patient does have cerebral palsy and has limited history though he is able to say that it hurts with urination.  Family reports that he has had some off-and-on urinary issues for the last 3 to 4 months but acute changes in the last 3 days.  Patient was overall reassuring on  exam.  Bladder scan revealed 540 mL of urine.  Foley cath placed.  Patient does have findings consistent with UTI with urinalysis and elevated white blood cell count.  Again he does not meet sepsis criteria with a reassuring lactic, heart rate improved from 110 on arrival to the 80s after we will replace urinary cath.  He has been afebrile.  At this time does not meet sepsis criteria.  I discussed with the family admission for IV antibiotics versus IV antibiotic care with oral antibiotics at home.  At this time they would prefer to be discharged.  At this time I have given strict return precautions for any fever, altered mental status, increasing abdominal pain, back pain.  They are agreeable with this at this time..  Follow-up with primary care as needed.  Patient is given ED precautions to return to the ED for any worsening or new symptoms.     FINAL CLINICAL IMPRESSION(S) / ED DIAGNOSES   Final diagnoses:  Urinary retention  Lower urinary tract infectious disease  Constipation, unspecified constipation type     Rx / DC Orders   ED Discharge Orders          Ordered    tamsulosin (FLOMAX) 0.4 MG CAPS capsule  Daily        02/27/23 0004    docusate sodium (COLACE) 100 MG capsule  2 times daily        02/27/23 0003             Note:  This document was prepared using Dragon voice recognition software and may include unintentional dictation errors.   Racheal Patches, PA-C 02/27/23 0004    Concha Se, MD 03/02/23 [phone]

## 2023-02-27 ENCOUNTER — Telehealth: Payer: Self-pay | Admitting: *Deleted

## 2023-02-27 ENCOUNTER — Other Ambulatory Visit: Payer: Self-pay | Admitting: Family Medicine

## 2023-02-27 DIAGNOSIS — G800 Spastic quadriplegic cerebral palsy: Secondary | ICD-10-CM

## 2023-02-27 DIAGNOSIS — R339 Retention of urine, unspecified: Secondary | ICD-10-CM | POA: Diagnosis not present

## 2023-02-27 MED ORDER — DOCUSATE SODIUM 100 MG PO CAPS: 100.0000 mg | ORAL_CAPSULE | Freq: Two times a day (BID) | ORAL | 0 refills | Status: DC

## 2023-02-27 MED ORDER — TAMSULOSIN HCL 0.4 MG PO CAPS: 0.4000 mg | ORAL_CAPSULE | Freq: Every day | ORAL | 1 refills | Status: DC

## 2023-02-27 NOTE — Progress Notes (Signed)
  Care Coordination   Note   02/27/2023 Name: Bradley [name] MRN: [id number] DOB: Feb 02, 1976  Bradley [name] is a 47 y.o. year old male who sees Danella Penton, MD for primary care. I reached out to Tia Alert by phone today to offer care coordination services.  [name] was given information about Care Coordination services today including:   The Care Coordination services include support from the care team which includes your Nurse Coordinator, Clinical Social Worker, or Pharmacist.  The Care Coordination team is here to help remove barriers to the health concerns and goals most important to you. Care Coordination services are voluntary, and the patient may decline or stop services at any time by request to their care team member.   Care Coordination Consent Status: Patient agreed to services and verbal consent obtained.   Follow up plan:  Telephone appointment with care coordination team member scheduled for:  02/28/2023 and 03/04/2023   Encounter Outcome:  Pt. Scheduled from referral   [name], [facility] Care Coordination Care Guide Direct Dial: [phone]

## 2023-02-28 ENCOUNTER — Ambulatory Visit: Payer: Self-pay

## 2023-02-28 LAB — URINE CULTURE: Culture: NO GROWTH

## 2023-02-28 NOTE — Patient Outreach (Signed)
  Care Coordination   Initial Visit Note   02/28/2023 Name: Jami Seaborn MRN: [id number] DOB: [birth year]  Tyron Nakao is a 47 y.o. year old male who sees Danella Penton, MD for primary care. I spoke with  Tia Alert 's mother Alvino Chapel, by phone today.  What matters to the patients health and wellness today?  Placed call to patient and mother Alvino Chapel answered the phone. She is patient representative. Reviewed the reason for the call.  Ms. Bombara reports that she forgot appointment and is in the car.  Appointment will be rescheduled.      SDOH assessments and interventions completed:  No     Care Coordination Interventions:  Yes, provided   Interventions Today    Flowsheet Row Most Recent Value  Education Interventions   Education Provided Provided Education  [reviewed [facility] program and appointment will be rescheduled.]        Follow up plan: Follow up call scheduled for 03/04/2023    Encounter Outcome:  Pt. Visit Completed   Rowe Pavy, RN, BSN, CEN [facility] Coordinator [phone]

## 2023-03-04 ENCOUNTER — Other Ambulatory Visit: Payer: Self-pay

## 2023-03-04 ENCOUNTER — Encounter: Payer: Self-pay | Admitting: Intensive Care

## 2023-03-04 ENCOUNTER — Emergency Department: Payer: Medicare Other

## 2023-03-04 ENCOUNTER — Encounter: Payer: Self-pay | Admitting: *Deleted

## 2023-03-04 ENCOUNTER — Observation Stay
Admission: EM | Admit: 2023-03-04 | Discharge: 2023-03-05 | Disposition: A | Discharge status: Still In Hospital | Payer: Medicare Other | Attending: Obstetrics and Gynecology | Admitting: Obstetrics and Gynecology

## 2023-03-04 ENCOUNTER — Ambulatory Visit: Payer: Self-pay

## 2023-03-04 DIAGNOSIS — Z960 Presence of urogenital implants: Secondary | ICD-10-CM | POA: Diagnosis not present

## 2023-03-04 DIAGNOSIS — K5900 Constipation, unspecified: Secondary | ICD-10-CM | POA: Diagnosis not present

## 2023-03-04 DIAGNOSIS — G914 Hydrocephalus in diseases classified elsewhere: Secondary | ICD-10-CM | POA: Diagnosis present

## 2023-03-04 DIAGNOSIS — R7989 Other specified abnormal findings of blood chemistry: Secondary | ICD-10-CM | POA: Diagnosis not present

## 2023-03-04 DIAGNOSIS — E440 Moderate protein-calorie malnutrition: Secondary | ICD-10-CM | POA: Diagnosis present

## 2023-03-04 DIAGNOSIS — R000 Tachycardia, unspecified: Principal | ICD-10-CM | POA: Insufficient documentation

## 2023-03-04 DIAGNOSIS — N401 Enlarged prostate with lower urinary tract symptoms: Secondary | ICD-10-CM | POA: Diagnosis present

## 2023-03-04 DIAGNOSIS — G808 Other cerebral palsy: Secondary | ICD-10-CM | POA: Diagnosis not present

## 2023-03-04 DIAGNOSIS — G800 Spastic quadriplegic cerebral palsy: Secondary | ICD-10-CM | POA: Diagnosis present

## 2023-03-04 DIAGNOSIS — R339 Retention of urine, unspecified: Secondary | ICD-10-CM | POA: Diagnosis not present

## 2023-03-04 DIAGNOSIS — N3000 Acute cystitis without hematuria: Secondary | ICD-10-CM | POA: Diagnosis not present

## 2023-03-04 DIAGNOSIS — R6250 Unspecified lack of expected normal physiological development in childhood: Secondary | ICD-10-CM | POA: Diagnosis present

## 2023-03-04 DIAGNOSIS — N390 Urinary tract infection, site not specified: Secondary | ICD-10-CM | POA: Diagnosis not present

## 2023-03-04 DIAGNOSIS — K3189 Other diseases of stomach and duodenum: Secondary | ICD-10-CM | POA: Diagnosis not present

## 2023-03-04 LAB — CBC WITH DIFFERENTIAL/PLATELET
Abs Immature Granulocytes: 0.16 10*3/uL — ABNORMAL HIGH (ref 0.00–0.07)
Basophils Absolute: 0 10*3/uL (ref 0.0–0.1)
Basophils Relative: 1 %
Eosinophils Absolute: 0 10*3/uL (ref 0.0–0.5)
Eosinophils Relative: 1 %
HCT: 39.7 % (ref 39.0–52.0)
Hemoglobin: 13.8 g/dL (ref 13.0–17.0)
Immature Granulocytes: 2 %
Lymphocytes Relative: 19 %
Lymphs Abs: 1.5 10*3/uL (ref 0.7–4.0)
MCH: 30.7 pg (ref 26.0–34.0)
MCHC: 34.8 g/dL (ref 30.0–36.0)
MCV: 88.2 fL (ref 80.0–100.0)
Monocytes Absolute: 0.7 10*3/uL (ref 0.1–1.0)
Monocytes Relative: 9 %
Neutro Abs: 5.3 10*3/uL (ref 1.7–7.7)
Neutrophils Relative %: 68 %
Platelets: 512 10*3/uL — ABNORMAL HIGH (ref 150–400)
RBC: 4.5 MIL/uL (ref 4.22–5.81)
RDW: 12.2 % (ref 11.5–15.5)
WBC: 7.7 10*3/uL (ref 4.0–10.5)
nRBC: 0 % (ref 0.0–0.2)

## 2023-03-04 LAB — URINALYSIS, ROUTINE W REFLEX MICROSCOPIC
Bilirubin Urine: NEGATIVE
Glucose, UA: NEGATIVE mg/dL
Ketones, ur: NEGATIVE mg/dL
Nitrite: NEGATIVE
Protein, ur: NEGATIVE mg/dL
RBC / HPF: >50 RBC/hpf (ref 0–5)
Specific Gravity, Urine: 1.012 (ref 1.005–1.030)
Squamous Epithelial / HPF: NONE SEEN /HPF (ref 0–5)
pH: 6 (ref 5.0–8.0)

## 2023-03-04 LAB — COMPREHENSIVE METABOLIC PANEL
ALT: 28 U/L (ref 0–44)
AST: 25 U/L (ref 15–41)
Albumin: 3.7 g/dL (ref 3.5–5.0)
Alkaline Phosphatase: 80 U/L (ref 38–126)
Anion gap: 8 (ref 5–15)
BUN: 10 mg/dL (ref 6–20)
CO2: 25 mmol/L (ref 22–32)
Calcium: 9 mg/dL (ref 8.9–10.3)
Chloride: 98 mmol/L (ref 98–111)
Creatinine, Ser: 0.38 mg/dL — ABNORMAL LOW (ref 0.61–1.24)
GFR, Estimated: >60 mL/min (ref >60)
Glucose, Bld: 139 mg/dL — ABNORMAL HIGH (ref 70–99)
Potassium: 3.4 mmol/L — ABNORMAL LOW (ref 3.5–5.1)
Sodium: 131 mmol/L — ABNORMAL LOW (ref 135–145)
Total Bilirubin: 0.4 mg/dL (ref 0.3–1.2)
Total Protein: 7.3 g/dL (ref 6.5–8.1)

## 2023-03-04 LAB — CBC
HCT: 38.6 % — ABNORMAL LOW (ref 39.0–52.0)
Hemoglobin: 13.6 g/dL (ref 13.0–17.0)
MCH: 31.1 pg (ref 26.0–34.0)
MCHC: 35.2 g/dL (ref 30.0–36.0)
MCV: 88.1 fL (ref 80.0–100.0)
Platelets: 485 10*3/uL — ABNORMAL HIGH (ref 150–400)
RBC: 4.38 MIL/uL (ref 4.22–5.81)
RDW: 12.2 % (ref 11.5–15.5)
WBC: 6.7 10*3/uL (ref 4.0–10.5)
nRBC: 0 % (ref 0.0–0.2)

## 2023-03-04 LAB — CREATININE, SERUM
Creatinine, Ser: 0.38 mg/dL — ABNORMAL LOW (ref 0.61–1.24)
GFR, Estimated: >60 mL/min (ref >60)

## 2023-03-04 LAB — LACTIC ACID, PLASMA
Lactic Acid, Venous: 1.7 mmol/L (ref 0.5–1.9)
Lactic Acid, Venous: 1.3 mmol/L (ref 0.5–1.9)

## 2023-03-04 LAB — PROTIME-INR
INR: 1.1 (ref 0.8–1.2)
Prothrombin Time: 14 seconds (ref 11.4–15.2)

## 2023-03-04 MED ORDER — SODIUM CHLORIDE 0.9 % IV BOLUS
500.0000 mL | Freq: Once | INTRAVENOUS | Status: AC
  Administered 2023-03-04: 500 mL via INTRAVENOUS

## 2023-03-04 MED ORDER — ACETAMINOPHEN 325 MG PO TABS
650.0000 mg | ORAL_TABLET | Freq: Four times a day (QID) | ORAL | Status: DC | PRN
  Administered 2023-03-04: 650 mg via ORAL
  Filled 2023-03-04: qty 2

## 2023-03-04 MED ORDER — SODIUM CHLORIDE 0.9 % IV SOLN: 1.0000 g | INTRAVENOUS | Status: DC

## 2023-03-04 MED ORDER — QUETIAPINE FUMARATE 25 MG PO TABS
25.0000 mg | ORAL_TABLET | Freq: Every day | ORAL | Status: DC
  Administered 2023-03-04: 25 mg via ORAL
  Filled 2023-03-04: qty 1

## 2023-03-04 MED ORDER — SODIUM CHLORIDE 0.9 % IV SOLN: INTRAVENOUS | Status: DC

## 2023-03-04 MED ORDER — SENNA 8.6 MG PO TABS
1.0000 | ORAL_TABLET | Freq: Two times a day (BID) | ORAL | Status: DC
  Administered 2023-03-04 – 2023-03-05 (×2): 8.6 mg via ORAL
  Filled 2023-03-04 (×2): qty 1

## 2023-03-04 MED ORDER — DOCUSATE SODIUM 100 MG PO CAPS
100.0000 mg | ORAL_CAPSULE | Freq: Two times a day (BID) | ORAL | Status: DC
  Administered 2023-03-04 – 2023-03-05 (×2): 100 mg via ORAL
  Filled 2023-03-04 (×2): qty 1

## 2023-03-04 MED ORDER — SODIUM CHLORIDE 0.9 % IV SOLN
1.0000 g | Freq: Once | INTRAVENOUS | Status: AC
  Administered 2023-03-04: 1 g via INTRAVENOUS
  Filled 2023-03-04: qty 10

## 2023-03-04 MED ORDER — POTASSIUM CHLORIDE 20 MEQ PO PACK
40.0000 meq | PACK | Freq: Once | ORAL | Status: AC
  Administered 2023-03-04: 40 meq via ORAL
  Filled 2023-03-04: qty 2

## 2023-03-04 MED ORDER — BACLOFEN 10 MG PO TABS
20.0000 mg | ORAL_TABLET | Freq: Four times a day (QID) | ORAL | Status: DC
  Administered 2023-03-04 – 2023-03-05 (×3): 20 mg via ORAL
  Filled 2023-03-04 (×3): qty 2

## 2023-03-04 MED ORDER — ONDANSETRON HCL 4 MG/2ML IJ SOLN: 4.0000 mg | Freq: Four times a day (QID) | INTRAMUSCULAR | Status: DC | PRN

## 2023-03-04 MED ORDER — FLUCONAZOLE 100 MG PO TABS: 100.0000 mg | ORAL_TABLET | ORAL | Status: DC

## 2023-03-04 MED ORDER — ONDANSETRON HCL 4 MG PO TABS: 4.0000 mg | ORAL_TABLET | Freq: Four times a day (QID) | ORAL | Status: DC | PRN

## 2023-03-04 MED ORDER — IOHEXOL 300 MG/ML  SOLN
100.0000 mL | Freq: Once | INTRAMUSCULAR | Status: AC | PRN
  Administered 2023-03-04: 100 mL via INTRAVENOUS

## 2023-03-04 MED ORDER — ENOXAPARIN SODIUM 40 MG/0.4ML IJ SOSY
40.0000 mg | PREFILLED_SYRINGE | INTRAMUSCULAR | Status: DC
  Administered 2023-03-04: 40 mg via SUBCUTANEOUS
  Filled 2023-03-04: qty 0.4

## 2023-03-04 MED ORDER — TAMSULOSIN HCL 0.4 MG PO CAPS
0.4000 mg | ORAL_CAPSULE | Freq: Every day | ORAL | Status: DC
  Administered 2023-03-04 – 2023-03-05 (×2): 0.4 mg via ORAL
  Filled 2023-03-04 (×2): qty 1

## 2023-03-04 MED ORDER — POLYETHYLENE GLYCOL 3350 17 G PO PACK
17.0000 g | PACK | Freq: Every day | ORAL | Status: DC
  Administered 2023-03-04 – 2023-03-05 (×2): 17 g via ORAL
  Filled 2023-03-04 (×2): qty 1

## 2023-03-04 MED ORDER — ACETAMINOPHEN 650 MG RE SUPP: 650.0000 mg | Freq: Four times a day (QID) | RECTAL | Status: DC | PRN

## 2023-03-04 NOTE — ED Notes (Signed)
Lab called to collect blood cultures and lactic.

## 2023-03-04 NOTE — ED Triage Notes (Addendum)
Patient seen at [facility] this AM and sent over due to yellowish/white thick discharge from penis, tachycardic, and constipated X7 days.  Recently seen in ER and diagnosed with UTI and discharged with catheter and urology appointment scheduled for 03/14/23  Tried miralax, colace, and prune juice with no results.   Mom takes care of patient at home and reports it has gotten to the point that she cannot care for him at home anymore. Would like to speak with social work about placement.   Family also reports he has had severe yeast infection for months.

## 2023-03-04 NOTE — H&P (Addendum)
History and Physical    Drin Kishimoto AOZ:[id number] DOB: Oct 31, 1975 DOA: 03/04/2023  PCP: Danella Penton, MD   Patient coming from: Home  I have personally briefly reviewed patient's old medical records in [facility]  Chief Complaint: sent from [place] clinic for UTI, constipation.  HPI: Bradley [name] is a 47 y.o. male with PMH significant for cerebral palsy, seizures, VP shunt, chronic idiopathic constipation who was sent from [facility] clinic for further evaluation.  Patient was recently diagnosed with UTI and acute urinary obstruction requiring Foley catheter, he was prescribed ciprofloxacin, he continued to have urinary leakage despite having urinary catheter, mother who is her primary caretaker denies any fever, chills, shortness of breath.  Patient has not had a bowel movement in the last 7 days and mother also noticed some discharge from the penis, and today he was seen by his primary care physician at [facility] clinic and was concern for dislodged urinary catheter, patient was sent in the ED.  At baseline patient is bedbound, wheelchair-bound.  ED Course: He was tachycardic other vitals were stable. HR 111, temp 99.6, RR 16, BP 117/70, SpO2 97% on room air. Labs include sodium 131, potassium 3.4, chloride 98, bicarb 25, glucose 139, BUN 10, creatinine 0.38, calcium 9.0, alkaline phosphatase 80, albumin 3.7, AST 25, ALT 28, total protein 7.3, total bilirubin 0.4, lactic acid 1.7, WBC 7.7, hemoglobin 13.8, hematocrit 39.7, MCV 88.2, platelet 512, UA large hemoglobin, nitrites negative, leukocyte esterase bacteria rare Chest x-ray reviewed patient catheters along the left hemithorax. CT abdomen and pelvis shows Foley catheter in the decompressed thick-walled bladder, large colonic stool load, no evidence of obstruction  Review of Systems: Review of Systems  Constitutional: Negative.   HENT: Negative.    Eyes: Negative.   Respiratory: Negative.    Cardiovascular: Negative.    Gastrointestinal: Negative.   Genitourinary:  Positive for dysuria.       Urinary burning, urinary retention requiring Foley catheter  Skin: Negative.   Neurological:        History of seizures, cerebral palsy, bedbound, hydrocephalus, VP shunt     Past Medical History:  Diagnosis Date   Allergy    Blood transfusion without reported diagnosis    Cerebral palsy, hemiplegic (HCC)    Chronic idiopathic constipation    Fracture    4 fractured riibs   Neuromuscular disorder (HCC)    Seizure disorder (HCC)     Past Surgical History:  Procedure Laterality Date   ADENOIDECTOMY     CHOLECYSTECTOMY     cranial-peritoneal shunt     TONSILLECTOMY       reports that he has never smoked. He has never used smokeless tobacco. He reports that he does not drink alcohol and does not use drugs.  Allergies  Allergen Reactions   Morphine Other (See Comments)    Other reaction(s): Unknown Had a hard time waking up after surgery. Had a hard time waking up after surgery.     Family History  Problem Relation Age of Onset   Hyperlipidemia Mother    Hypertension Mother    Ulcerative colitis Mother    Diabetes Mother    Migraines Mother    Osteoarthritis Mother    Diabetes Father    Hypertension Father    Migraines Sister    Diabetes Maternal Uncle    Breast cancer Maternal Grandmother    Heart attack Maternal Grandmother    Diabetes Maternal Grandfather    Heart attack Maternal Great-grandfather    Cancer  Other        Prostate and Breast   Colon cancer Neg Hx    Esophageal cancer Neg Hx    Rectal cancer Neg Hx    Stomach cancer Neg Hx    Family history reviewed and not pertinent.  Prior to Admission medications   Medication Sig Start Date End Date Taking? Authorizing Provider  acetaminophen (TYLENOL) 500 MG tablet Take 500 mg by mouth every 6 (six) hours as needed for headache, fever, moderate pain or mild pain.   Yes [provider]  baclofen (LIORESAL) 20 MG tablet  Take 1 tablet (20 mg total) by mouth 4 (four) times daily. 06/20/22  Yes Cox, Kirsten, MD  chlorhexidine (PERIDEX) 0.12 % solution Use as directed 15 mLs in the mouth or throat 2 (two) times daily. Patient taking differently: Use as directed 15 mLs in the mouth or throat daily as needed. 01/30/22  Yes Cox, Kirsten, MD  ciprofloxacin (CIPRO) 500 MG tablet Take 500 mg by mouth 2 (two) times daily. 02/25/23  Yes [provider]  docusate sodium (COLACE) 100 MG capsule Take 1 capsule (100 mg total) by mouth 2 (two) times daily. 02/26/23 02/26/24 Yes Cuthriell, Delorise Royals, PA-C  fluconazole (DIFLUCAN) 100 MG tablet Take 1 tablet (100 mg total) by mouth daily. Patient taking differently: Take 100 mg by mouth once a week. 08/06/22  Yes Cox, Kirsten, MD  loratadine (KLS ALLERCLEAR) 10 MG tablet Take 10 mg by mouth daily.   Yes [provider]  nystatin (MYCOSTATIN/NYSTOP) powder Apply 1 Application topically daily.   Yes [provider]  polyethylene glycol powder (GLYCOLAX/MIRALAX) 17 GM/SCOOP powder Take 17 g by mouth daily.   Yes [provider]  QUEtiapine (SEROQUEL) 25 MG tablet Take 25 mg by mouth at bedtime.   Yes [provider]  tamsulosin (FLOMAX) 0.4 MG CAPS capsule Take 1 capsule (0.4 mg total) by mouth daily. Patient taking differently: Take 0.4 mg by mouth at bedtime. 02/27/23  Yes Cuthriell, Delorise Royals, PA-C  cephALEXin (KEFLEX) 500 MG capsule Take 1 capsule (500 mg total) by mouth 4 (four) times daily. Patient not taking: Reported on 03/04/2023 09/26/22   Loleta Rose, MD  Vitamin D, Ergocalciferol, (DRISDOL) 1.25 MG (50000 UNIT) CAPS capsule Take 1 capsule (50,000 Units total) by mouth every 7 (seven) days. Patient not taking: Reported on 03/04/2023 07/10/22   Janie Morning, NP    Physical Exam: Vitals:   03/04/23 1412 03/04/23 1419 03/04/23 1422 03/04/23 2042  BP: 117/70   (!) 134/97  Pulse: (!) 111   98  Resp: 16   (!) 21  Temp:   99.6 F  (37.6 C) 98.1 F (36.7 C)  TempSrc:   Oral Oral  SpO2: 97%   98%  Weight:  49.9 kg    Height:  5\' 2"  (1.575 m)      Constitutional: Appears comfortable, deconditioned, calm, not in any distress, Vitals:   03/04/23 1412 03/04/23 1419 03/04/23 1422 03/04/23 2042  BP: 117/70   (!) 134/97  Pulse: (!) 111   98  Resp: 16   (!) 21  Temp:   99.6 F (37.6 C) 98.1 F (36.7 C)  TempSrc:   Oral Oral  SpO2: 97%   98%  Weight:  49.9 kg    Height:  5\' 2"  (1.575 m)     Eyes: PERRL, lids and conjunctivae normal ENMT: Mucous membranes are moist.  Posterior pharynx without exudate. Neck: normal, supple, no masses, no thyromegaly Respiratory:  clear to auscultation bilaterally, no wheezing, no crackles. Normal respiratory effort. No accessory muscle use.  Cardiovascular: S1-S2 heard,  regular rate and rhythm, no murmurs / rubs / gallops. .  Abdomen: Soft, mildly tender in suprapubic, . Bowel sounds positive.  Musculoskeletal: Contracted extremities.  No edema, no cyanosis. Skin: no rashes, lesions, ulcers. No induration Neurologic: Alert and oriented x 2, contracted lower extremities. Psychiatric: Alert and oriented x 2. Normal mood.    Labs on Admission: I have personally reviewed following labs and imaging studies  CBC: Recent Labs  Lab 02/26/23 2041 03/04/23 1415  WBC 18.8* 7.7  NEUTROABS 16.0* 5.3  HGB 13.5 13.8  HCT 40.1 39.7  MCV 90.7 88.2  PLT 307 512*   Basic Metabolic Panel: Recent Labs  Lab 02/26/23 2041 03/04/23 1415  NA 130* 131*  K 3.5 3.4*  CL 97* 98  CO2 23 25  GLUCOSE 94 139*  BUN 15 10  CREATININE 0.39* 0.38*  CALCIUM 8.7* 9.0   GFR: Estimated Creatinine Clearance: 79.7 mL/min (A) (by C-G formula based on SCr of 0.38 mg/dL (L)). Liver Function Tests: Recent Labs  Lab 02/26/23 2041 03/04/23 1415  AST 22 25  ALT 50* 28  ALKPHOS 112 80  BILITOT 1.1 0.4  PROT 7.4 7.3  ALBUMIN 3.7 3.7   No results for input(s): "LIPASE", "AMYLASE" in the last 168  hours. No results for input(s): "AMMONIA" in the last 168 hours. Coagulation Profile: Recent Labs  Lab 03/04/23 1415  INR 1.1   Cardiac Enzymes: No results for input(s): "CKTOTAL", "CKMB", "CKMBINDEX", "TROPONINI" in the last 168 hours. BNP (last 3 results) No results for input(s): "PROBNP" in the last 8760 hours. HbA1C: No results for input(s): "HGBA1C" in the last 72 hours. CBG: No results for input(s): "GLUCAP" in the last 168 hours. Lipid Profile: No results for input(s): "CHOL", "HDL", "LDLCALC", "TRIG", "CHOLHDL", "LDLDIRECT" in the last 72 hours. Thyroid Function Tests: No results for input(s): "TSH", "T4TOTAL", "FREET4", "T3FREE", "THYROIDAB" in the last 72 hours. Anemia Panel: No results for input(s): "VITAMINB12", "FOLATE", "FERRITIN", "TIBC", "IRON", "RETICCTPCT" in the last 72 hours. Urine analysis:    Component Value Date/Time   COLORURINE YELLOW (A) 03/04/2023 1749   APPEARANCEUR HAZY (A) 03/04/2023 1749   LABSPEC 1.012 03/04/2023 1749   PHURINE 6.0 03/04/2023 1749   GLUCOSEU NEGATIVE 03/04/2023 1749   HGBUR LARGE (A) 03/04/2023 1749   BILIRUBINUR NEGATIVE 03/04/2023 1749   BILIRUBINUR negative 01/25/2022 1020   BILIRUBINUR negative 12/27/2021 1025   KETONESUR NEGATIVE 03/04/2023 1749   PROTEINUR NEGATIVE 03/04/2023 1749   UROBILINOGEN 0.2 01/25/2022 1020   NITRITE NEGATIVE 03/04/2023 1749   LEUKOCYTESUR TRACE (A) 03/04/2023 1749    Radiological Exams on Admission: CT ABDOMEN PELVIS W CONTRAST  Result Date: 03/04/2023 CLINICAL DATA:  Acute nonlocalized abdominal pain. Yellowish/white thick discharge from penis. Tachycardic. Constipated. Recently diagnosed with UTI. EXAM: CT ABDOMEN AND PELVIS WITH CONTRAST TECHNIQUE: Multidetector CT imaging of the abdomen and pelvis was performed using the standard protocol following bolus administration of intravenous contrast. RADIATION DOSE REDUCTION: This exam was performed according to the departmental dose-optimization  program which includes automated exposure control, adjustment of the mA and/or kV according to patient size and/or use of iterative reconstruction technique. CONTRAST:  OMNIPAQUE IOHEXOL 300 MG/ML  SOLN COMPARISON:  CT abdomen and pelvis 02/26/2023 FINDINGS: Lower chest: No acute abnormality.  Bibasilar atelectasis. Hepatobiliary: No focal liver abnormality is seen. Status post cholecystectomy. Prominent common bile duct compatible with reservoir effect from cholecystectomy.  Pancreas: Unremarkable. Spleen: Unremarkable. Adrenals/Urinary Tract: Stable adrenal glands. Low-attenuation lesions in the kidneys are statistically likely to represent cysts. No follow-up is required. No urinary calculi or hydronephrosis. Foley catheter in the decompressed thick-walled bladder. Stomach/Bowel: Normal caliber large and small bowel. Large colonic stool load. Normal appendix. No bowel wall thickening. Stomach is within normal limits. Vascular/Lymphatic: No significant vascular findings are present. No enlarged abdominal or pelvic lymph nodes. Reproductive: Enlarged prostate. Other: No free intraperitoneal fluid or air. Optimally visualized VP shunt. Musculoskeletal: No acute fracture. Right convex thoracolumbar curve. Right hip dysplasia. IMPRESSION: 1. Foley catheter in the decompressed thick-walled bladder. Recommend correlation with urinalysis to exclude cystitis. 2. Large colonic stool load.  No evidence of obstruction. Electronically Signed   By: Minerva Fester M.D.   On: 03/04/2023 19:30   DG Chest Port 1 View  Result Date: 03/04/2023 CLINICAL DATA:  Constipation.  Tachycardia.  UTI EXAM: PORTABLE CHEST 1 VIEW COMPARISON:  Chest CT 05/23/2022 FINDINGS: Film is rotated to the left and underinflated. Particular volume loss of the left hemithorax with some basilar atelectasis or scar. No consolidation, pneumothorax or edema. Normal cardiopericardial silhouette. Overlapping cardiac leads. Osteopenia. VP shunt catheter  tubing running vertically along the left hemithorax. There are some calcifications along portions of the catheters. Previous rib fractures are not as well seen on this x-ray. IMPRESSION: Rotated radiograph with volume loss and left basilar atelectasis or scar. VP shunt catheters along the left hemithorax. Please correlate with the history. There is some calcifications along the course of the catheters. Electronically Signed   By: Karen Kays M.D.   On: 03/04/2023 19:02    EKG: Independently reviewed.  Normal sinus rhythm.  Assessment/Plan Principal Problem:   Complicated UTI (urinary tract infection) Active Problems:   Developmental delay   Spastic quadriplegic cerebral palsy (HCC)   Moderate protein-calorie malnutrition (HCC)   Benign prostatic hyperplasia with incomplete bladder emptying   Hydrocephalus in diseases classified elsewhere [facility])   Complicated UTI: Patient was diagnosed with UTI last week and started on antibiotics. Patient has developed suprapubic pain, He was evaluated in the [place] clinic. There was a concern about dislodged urinary catheter. Foley catheter was exchanged in the ED. UA consistent with UTI. Continue IV ceftriaxone, follow-up urine culture. Failed outpatient treatment for UTI  BPH with urinary retention: Urinary catheter was exchanged. Continue Foley catheter monitor urine output.. Continue Flomax 0.4 mg daily. Obtain  urology consult in the morning  Developmental Delay / Cerebral Palsy: Mother is the only caretaker.  There is no other nursing help.  Idiopathic constipation: Mother reports patient has not had a bowel movement in last 1 week despite on MiraLAX and Colace. CT abdomen shows no obstruction but shows large stool burden. Start aggressive bowel regimen, Colace, Senokot, MiraLAX Tapwater enema  Hx. of hydrocephalus, VP shunt Stable.  No concerns.  Chronic yeast infection.: Mother report he has recurrent yeast infection for last 11  months. Continue Diflucan 100 mg weekly. Consider ID consult to discuss duration   DVT prophylaxis: Lovenox Code Status: Full code Family Communication: Mother at bed side. Disposition Plan:   Status is: Inpatient Remains inpatient appropriate because: Admitted foir complicated UTI, idiopathic constipation.   Consults called: None Admission status:Inpatient   Willeen Niece MD [facility]   If 7PM-7AM, please contact night-coverage   03/04/2023, 8:47 PM

## 2023-03-04 NOTE — Patient Outreach (Signed)
  Care Coordination   03/04/2023 Name: Naftuli Dhondt MRN: [id number] DOB: [birth year]   Care Coordination Outreach Attempts:  An unsuccessful telephone outreach was attempted for a scheduled appointment today.  Upon review of EMR noted that patient is in the ED at [facility]  Follow Up Plan:  Additional outreach attempts will be made to offer the patient care coordination information and services.   Encounter Outcome:  No Answer   Care Coordination Interventions:  No, not indicated    Rowe Pavy, RN, BSN, [facility] [phone]

## 2023-03-04 NOTE — ED Provider Notes (Signed)
[facility] Provider Note    Event Date/Time   First MD Initiated Contact with Patient 03/04/23 1637     (approximate)   History   Tachycardia   HPI  Aslan Burditt is a 47 y.o. male   Past medical history of cerebral palsy, seizures, VP shunt, chronic idiopathic constipation, recently diagnosed UTI and urinary tract obstruction with Foley placement who presents to the emergency department with ongoing constipation abdominal pain and questionable dislodged urethral catheter.  Patient was recently seen in the emergency department after being diagnosed with UTI by primary doctor and was found to have urinary retention and Foley placement.  Since then he has been constipated and this morning noted the ureteral catheter was dislodged and a family member placed it back.  They noted some purulent white drainage around the catheter.  This patient lives with his mother and has no outside help and mother has been having lots of difficulty taking care of him, mobilizing him from bed to the bathroom, giving him medications, etc.   Independent Historian contributed to assessment above: Mother and sister at bedside to corroborate information given above.  External Medical Documents Reviewed: Emergency department note dated 02/26/2023 for urinary retention given a Foley catheter      Physical Exam   Triage Vital Signs: ED Triage Vitals  Enc Vitals Group     BP 03/04/23 1412 117/70     Pulse Rate 03/04/23 1412 (!) 111     Resp 03/04/23 1412 16     Temp 03/04/23 1422 99.6 F (37.6 C)     Temp Source 03/04/23 1422 Oral     SpO2 03/04/23 1412 97 %     Weight 03/04/23 1419 110 lb (49.9 kg)     Height 03/04/23 1419 5\' 2"  (1.575 m)     Head Circumference --      Peak Flow --      Pain Score --      Pain Loc --      Pain Edu? --      Excl. in GC? --     Most recent vital signs: Vitals:   03/04/23 1412 03/04/23 1422  BP: 117/70   Pulse: (!) 111   Resp: 16    Temp:  99.6 F (37.6 C)  SpO2: 97%     General: Awake, no distress.  CV:  Good peripheral perfusion.  Resp:  Normal effort.  Abd:  No distention.  Other:  Contracted, distended abdomen with tenderness to the right side without rigidity or guarding.  Foley catheter in place draining clear yellow fluid   ED Results / Procedures / Treatments   Labs (all labs ordered are listed, but only abnormal results are displayed) Labs Reviewed  COMPREHENSIVE METABOLIC PANEL - Abnormal; Notable for the following components:      Result Value   Sodium 131 (*)    Potassium 3.4 (*)    Glucose, Bld 139 (*)    Creatinine, Ser 0.38 (*)    All other components within normal limits  CBC WITH DIFFERENTIAL/PLATELET - Abnormal; Notable for the following components:   Platelets 512 (*)    Abs Immature Granulocytes 0.16 (*)    All other components within normal limits  URINALYSIS, ROUTINE W REFLEX MICROSCOPIC - Abnormal; Notable for the following components:   Color, Urine YELLOW (*)    APPearance HAZY (*)    Hgb urine dipstick LARGE (*)    Leukocytes,Ua TRACE (*)    Bacteria, UA RARE (*)  All other components within normal limits  CULTURE, BLOOD (ROUTINE X 2)  CULTURE, BLOOD (ROUTINE X 2)  LACTIC ACID, PLASMA  LACTIC ACID, PLASMA  PROTIME-INR     I ordered and reviewed the above labs they are notable for normal white blood cell count and creatinine at baseline  EKG  ED ECG REPORT I, Pilar Jarvis, the attending physician, personally viewed and interpreted this ECG.   Date: 03/04/2023  EKG Time: 1728  Rate: 85  Rhythm: sinus  Axis: nl  Intervals:none  ST&T Change: no stemi    RADIOLOGY I independently reviewed and interpreted cxr and see no focality or pneumothorax   PROCEDURES:  Critical Care performed: No  Procedures   MEDICATIONS ORDERED IN ED: Medications  cefTRIAXone (ROCEPHIN) 1 g in sodium chloride 0.9 % 100 mL IVPB (has no administration in time range)  sodium  chloride 0.9 % bolus 500 mL (500 mLs Intravenous New Bag/Given 03/04/23 1810)  iohexol (OMNIPAQUE) 300 MG/ML solution 100 mL (100 mLs Intravenous Contrast Given 03/04/23 1838)    External physician / consultants:  I spoke with hospitalist regarding care plan for this patient.   IMPRESSION / MDM / ASSESSMENT AND PLAN / ED COURSE  I reviewed the triage vital signs and the nursing notes.                                Patient's presentation is most consistent with acute presentation with potential threat to life or bodily function.  Differential diagnosis includes, but is not limited to, urinary tract infection, obstruction, constipation, intra-abdominal infection   The patient is on the cardiac monitor to evaluate for evidence of arrhythmia and/or significant heart rate changes.  MDM: This is a patient cerebral palsy recent urinary tract fraction with urinary retention new Foley catheter placement who presents with abdominal pain in setting of constipation no bowel movement for 7 days, distention and tenderness.  CT abdomen pelvis ordered to rule out bowel obstruction or intra-abdominal infection in the surgical pathologies.  Will remove and replace Foley catheter given dislodgment earlier today to ensure proper placement and drainage.  He has had a urinary tract infection diagnosed less than 5 days ago so I will start him on ceftriaxone IV.  His only caretaker is his mother who has been unable to care for him at home so will be admitted.     Clinical Course as of 03/04/23 1856  Mon Mar 04, 2023  1658 Creatinine(!): 0.38 [JW]    Clinical Course User Index [JW] [place], [place], [place]     FINAL CLINICAL IMPRESSION(S) / ED DIAGNOSES   Final diagnoses:  Acute cystitis without hematuria  Constipation, unspecified constipation type  Urinary retention     Rx / DC Orders   ED Discharge Orders     None        Note:  This document was prepared using Dragon voice recognition  software and may include unintentional dictation errors.    Pilar Jarvis, MD 03/04/23 [phone]

## 2023-03-05 ENCOUNTER — Telehealth: Payer: Self-pay | Admitting: *Deleted

## 2023-03-05 DIAGNOSIS — K5909 Other constipation: Secondary | ICD-10-CM | POA: Diagnosis not present

## 2023-03-05 DIAGNOSIS — Z8619 Personal history of other infectious and parasitic diseases: Secondary | ICD-10-CM | POA: Diagnosis not present

## 2023-03-05 DIAGNOSIS — N390 Urinary tract infection, site not specified: Secondary | ICD-10-CM | POA: Diagnosis not present

## 2023-03-05 DIAGNOSIS — Q791 Other congenital malformations of diaphragm: Secondary | ICD-10-CM | POA: Diagnosis not present

## 2023-03-05 DIAGNOSIS — N401 Enlarged prostate with lower urinary tract symptoms: Secondary | ICD-10-CM | POA: Diagnosis not present

## 2023-03-05 DIAGNOSIS — R82998 Other abnormal findings in urine: Secondary | ICD-10-CM | POA: Diagnosis not present

## 2023-03-05 DIAGNOSIS — R000 Tachycardia, unspecified: Secondary | ICD-10-CM | POA: Diagnosis not present

## 2023-03-05 DIAGNOSIS — Z741 Need for assistance with personal care: Secondary | ICD-10-CM | POA: Diagnosis not present

## 2023-03-05 DIAGNOSIS — R293 Abnormal posture: Secondary | ICD-10-CM | POA: Diagnosis not present

## 2023-03-05 DIAGNOSIS — R278 Other lack of coordination: Secondary | ICD-10-CM | POA: Diagnosis not present

## 2023-03-05 DIAGNOSIS — G809 Cerebral palsy, unspecified: Secondary | ICD-10-CM | POA: Diagnosis not present

## 2023-03-05 DIAGNOSIS — J3089 Other allergic rhinitis: Secondary | ICD-10-CM | POA: Diagnosis not present

## 2023-03-05 DIAGNOSIS — R109 Unspecified abdominal pain: Secondary | ICD-10-CM | POA: Diagnosis not present

## 2023-03-05 DIAGNOSIS — N3000 Acute cystitis without hematuria: Secondary | ICD-10-CM | POA: Diagnosis not present

## 2023-03-05 DIAGNOSIS — R051 Acute cough: Secondary | ICD-10-CM | POA: Diagnosis not present

## 2023-03-05 DIAGNOSIS — R2681 Unsteadiness on feet: Secondary | ICD-10-CM | POA: Diagnosis not present

## 2023-03-05 DIAGNOSIS — M6259 Muscle wasting and atrophy, not elsewhere classified, multiple sites: Secondary | ICD-10-CM | POA: Diagnosis not present

## 2023-03-05 DIAGNOSIS — N139 Obstructive and reflux uropathy, unspecified: Secondary | ICD-10-CM | POA: Diagnosis not present

## 2023-03-05 DIAGNOSIS — R339 Retention of urine, unspecified: Secondary | ICD-10-CM | POA: Diagnosis not present

## 2023-03-05 DIAGNOSIS — I517 Cardiomegaly: Secondary | ICD-10-CM | POA: Diagnosis not present

## 2023-03-05 DIAGNOSIS — R1312 Dysphagia, oropharyngeal phase: Secondary | ICD-10-CM | POA: Diagnosis not present

## 2023-03-05 DIAGNOSIS — R4789 Other speech disturbances: Secondary | ICD-10-CM | POA: Diagnosis not present

## 2023-03-05 DIAGNOSIS — R509 Fever, unspecified: Secondary | ICD-10-CM | POA: Diagnosis not present

## 2023-03-05 DIAGNOSIS — E559 Vitamin D deficiency, unspecified: Secondary | ICD-10-CM | POA: Diagnosis not present

## 2023-03-05 DIAGNOSIS — E871 Hypo-osmolality and hyponatremia: Secondary | ICD-10-CM | POA: Diagnosis not present

## 2023-03-05 DIAGNOSIS — R7989 Other specified abnormal findings of blood chemistry: Secondary | ICD-10-CM | POA: Diagnosis not present

## 2023-03-05 DIAGNOSIS — K5900 Constipation, unspecified: Secondary | ICD-10-CM | POA: Diagnosis not present

## 2023-03-05 LAB — COMPREHENSIVE METABOLIC PANEL
ALT: 25 U/L (ref 0–44)
AST: 20 U/L (ref 15–41)
Albumin: 3.2 g/dL — ABNORMAL LOW (ref 3.5–5.0)
Alkaline Phosphatase: 72 U/L (ref 38–126)
Anion gap: 9 (ref 5–15)
BUN: 7 mg/dL (ref 6–20)
CO2: 21 mmol/L — ABNORMAL LOW (ref 22–32)
Calcium: 8.2 mg/dL — ABNORMAL LOW (ref 8.9–10.3)
Chloride: 105 mmol/L (ref 98–111)
Creatinine, Ser: 0.35 mg/dL — ABNORMAL LOW (ref 0.61–1.24)
GFR, Estimated: >60 mL/min (ref >60)
Glucose, Bld: 99 mg/dL (ref 70–99)
Potassium: 3.8 mmol/L (ref 3.5–5.1)
Sodium: 135 mmol/L (ref 135–145)
Total Bilirubin: 0.5 mg/dL (ref 0.3–1.2)
Total Protein: 6.3 g/dL — ABNORMAL LOW (ref 6.5–8.1)

## 2023-03-05 LAB — CBC
HCT: 36.7 % — ABNORMAL LOW (ref 39.0–52.0)
Hemoglobin: 12.7 g/dL — ABNORMAL LOW (ref 13.0–17.0)
MCH: 30.9 pg (ref 26.0–34.0)
MCHC: 34.6 g/dL (ref 30.0–36.0)
MCV: 89.3 fL (ref 80.0–100.0)
Platelets: 483 10*3/uL — ABNORMAL HIGH (ref 150–400)
RBC: 4.11 MIL/uL — ABNORMAL LOW (ref 4.22–5.81)
RDW: 12.4 % (ref 11.5–15.5)
WBC: 5.2 10*3/uL (ref 4.0–10.5)
nRBC: 0 % (ref 0.0–0.2)

## 2023-03-05 LAB — MAGNESIUM: Magnesium: 2.2 mg/dL (ref 1.7–2.4)

## 2023-03-05 LAB — PHOSPHORUS: Phosphorus: 3.1 mg/dL (ref 2.5–4.6)

## 2023-03-05 MED ORDER — SENNA 8.6 MG PO TABS: 1.0000 | ORAL_TABLET | Freq: Every day | ORAL | 0 refills | Status: AC

## 2023-03-05 MED ORDER — POLYETHYLENE GLYCOL 3350 17 GM/SCOOP PO POWD: 34.0000 g | Freq: Every day | ORAL | 3 refills | Status: AC

## 2023-03-05 NOTE — ED Notes (Signed)
Patient repositioned at this time

## 2023-03-05 NOTE — ED Notes (Signed)
Patient reposition at this time.

## 2023-03-05 NOTE — TOC Transition Note (Addendum)
Transition of Care [facility]) - CM/SW Discharge Note   Patient Details  Name: Bradley [name] MRN: [id number] Date of Birth: March 24, 1976  [facility] CM/SW Contact:  Darolyn Rua, LCSW Phone Number: 03/05/2023, 3:33 PM   Clinical Narrative:     Patient to discharge to [facility] room [phone], patient was approved for [facility] waiver and will transition to long term medicaid.   Number for report is [phone]. Treatment team made aware, family to transport at discharge.   No further discharge needs at this time.   Final next level of care: Skilled Nursing Facility Barriers to Discharge: No Barriers Identified   Patient Goals and CMS Choice CMS Medicare.gov Compare Post Acute Care list provided to:: Patient Represenative (must comment) (mother and sister)    Discharge Placement                         Discharge Plan and Services Additional resources added to the After Visit Summary for                                       Social Determinants of Health (SDOH) Interventions SDOH Screenings   Food Insecurity: No Food Insecurity (01/24/2022)  Housing: Low Risk  (01/24/2022)  Transportation Needs: No Transportation Needs (01/24/2022)  Alcohol Screen: Low Risk  (01/24/2022)  Depression (PHQ2-9): Low Risk  (08/10/2022)  Financial Resource Strain: Low Risk  (01/24/2022)  Physical Activity: Inactive (01/24/2022)  Social Connections: Unknown (01/24/2022)  Stress: No Stress Concern Present (01/24/2022)  Tobacco Use: Low Risk  (03/04/2023)     Readmission Risk Interventions     No data to display

## 2023-03-05 NOTE — ED Notes (Signed)
Patient reposition at this time. 

## 2023-03-05 NOTE — Discharge Summary (Signed)
Bradley [name] WGN:[id number] DOB: 1976/04/20 DOA: 03/04/2023  PCP: Danella Penton, MD  Admit date: 03/04/2023 Discharge date: 03/05/2023  Time spent: 35 minutes  Recommendations for Outpatient Follow-up:  Pcp and urology f/u     Discharge Diagnoses:  Principal Problem:   Complicated UTI (urinary tract infection) Active Problems:   Developmental delay   Spastic quadriplegic cerebral palsy (HCC)   Moderate protein-calorie malnutrition (HCC)   Benign prostatic hyperplasia with incomplete bladder emptying   Hydrocephalus in diseases classified elsewhere [facility])   Discharge Condition: stable  Diet recommendation: high fiber  Filed Weights   03/04/23 1419  Weight: 49.9 kg    History of present illness:  From admission h and p Bradley [name] is a 47 y.o. male with PMH significant for cerebral palsy, seizures, VP shunt, chronic idiopathic constipation who was sent from [place] clinic for further evaluation.  Patient was recently diagnosed with UTI and acute urinary obstruction requiring Foley catheter, he was prescribed ciprofloxacin, he continued to have urinary leakage despite having urinary catheter, mother who is her primary caretaker denies any fever, chills, shortness of breath.  Patient has not had a bowel movement in the last 7 days and mother also noticed some discharge from the penis, and today he was seen by his primary care physician at [facility] clinic and was concern for dislodged urinary catheter, patient was sent in the ED.  At baseline patient is bedbound, wheelchair-bound.   Hospital Course:  Patient presents primarily for constipation. There was also concern about dislodged foley catheter (placed a week ago in the ER for acute urinary retention) and uti. No symptoms of uti. Culture from one week ago negative and patient has been taking cipro and urinalysis not strongly suggestive of uti so although patient was started on antibiotics upon admission (no urine culture  ordered), I will not continue antibiotics at discharge. For his constipation patient passed a large soft formed stool in the ER. Wills top home docusate, up-titrate home miralax, and add senna, advise close pcp f/u. For concern for dislodged foley ct shows catheter is appropriately placed. Patient is on flomax and has urology f/u scheduled later this month.   Procedures: none   Consultations: none  Discharge Exam: Vitals:   03/05/23 0700 03/05/23 0740  BP: 123/80 123/80  Pulse: 84 95  Resp: 16 18  Temp:  98.9 F (37.2 C)  SpO2: 95% 94%    General: NAD, contractures Cardiovascular: RRR Respiratory: CTAB Abdomen: soft, non-tender  Discharge Instructions   Discharge Instructions     Diet general   Complete by: As directed    Increase activity slowly   Complete by: As directed       Allergies as of 03/05/2023       Reactions   Morphine Other (See Comments)   Other reaction(s): Unknown Had a hard time waking up after surgery. Had a hard time waking up after surgery.        Medication List     STOP taking these medications    cephALEXin 500 MG capsule Commonly known as: KEFLEX   docusate sodium 100 MG capsule Commonly known as: Colace       TAKE these medications    acetaminophen 500 MG tablet Commonly known as: TYLENOL Take 500 mg by mouth every 6 (six) hours as needed for headache, fever, moderate pain or mild pain.   baclofen 20 MG tablet Commonly known as: LIORESAL Take 1 tablet (20 mg total) by mouth 4 (four) times daily.  chlorhexidine 0.12 % solution Commonly known as: PERIDEX Use as directed 15 mLs in the mouth or throat 2 (two) times daily. What changed:  when to take this reasons to take this   ciprofloxacin 500 MG tablet Commonly known as: CIPRO Take 500 mg by mouth 2 (two) times daily.   fluconazole 100 MG tablet Commonly known as: Diflucan Take 1 tablet (100 mg total) by mouth daily. What changed: when to take this   KLS  AllerClear 10 MG tablet Generic drug: loratadine Take 10 mg by mouth daily.   nystatin powder Commonly known as: MYCOSTATIN/NYSTOP Apply 1 Application topically daily.   polyethylene glycol powder 17 GM/SCOOP powder Commonly known as: GLYCOLAX/MIRALAX Take 34 g by mouth daily. What changed: how much to take   QUEtiapine 25 MG tablet Commonly known as: SEROQUEL Take 25 mg by mouth at bedtime.   senna 8.6 MG Tabs tablet Commonly known as: SENOKOT Take 1 tablet (8.6 mg total) by mouth daily.   tamsulosin 0.4 MG Caps capsule Commonly known as: FLOMAX Take 1 capsule (0.4 mg total) by mouth daily. What changed: when to take this   Vitamin D (Ergocalciferol) 1.25 MG (50000 UNIT) Caps capsule Commonly known as: DRISDOL Take 1 capsule (50,000 Units total) by mouth every 7 (seven) days.       Allergies  Allergen Reactions   Morphine Other (See Comments)    Other reaction(s): Unknown Had a hard time waking up after surgery. Had a hard time waking up after surgery.     Follow-up Information     Danella Penton, MD Follow up.   Specialty: Internal Medicine Contact information: [phone] [facility] [address] [facility] [place] [address] [phone]         Urology Follow up.   Why: later this month as scheduled                 The results of significant diagnostics from this hospitalization (including imaging, microbiology, ancillary and laboratory) are listed below for reference.    Significant Diagnostic Studies: CT ABDOMEN PELVIS W CONTRAST  Result Date: 03/04/2023 CLINICAL DATA:  Acute nonlocalized abdominal pain. Yellowish/white thick discharge from penis. Tachycardic. Constipated. Recently diagnosed with UTI. EXAM: CT ABDOMEN AND PELVIS WITH CONTRAST TECHNIQUE: Multidetector CT imaging of the abdomen and pelvis was performed using the standard protocol following bolus administration of intravenous contrast. RADIATION DOSE REDUCTION:  This exam was performed according to the departmental dose-optimization program which includes automated exposure control, adjustment of the mA and/or kV according to patient size and/or use of iterative reconstruction technique. CONTRAST:  OMNIPAQUE IOHEXOL 300 MG/ML  SOLN COMPARISON:  CT abdomen and pelvis 02/26/2023 FINDINGS: Lower chest: No acute abnormality.  Bibasilar atelectasis. Hepatobiliary: No focal liver abnormality is seen. Status post cholecystectomy. Prominent common bile duct compatible with reservoir effect from cholecystectomy. Pancreas: Unremarkable. Spleen: Unremarkable. Adrenals/Urinary Tract: Stable adrenal glands. Low-attenuation lesions in the kidneys are statistically likely to represent cysts. No follow-up is required. No urinary calculi or hydronephrosis. Foley catheter in the decompressed thick-walled bladder. Stomach/Bowel: Normal caliber large and small bowel. Large colonic stool load. Normal appendix. No bowel wall thickening. Stomach is within normal limits. Vascular/Lymphatic: No significant vascular findings are present. No enlarged abdominal or pelvic lymph nodes. Reproductive: Enlarged prostate. Other: No free intraperitoneal fluid or air. Optimally visualized VP shunt. Musculoskeletal: No acute fracture. Right convex thoracolumbar curve. Right hip dysplasia. IMPRESSION: 1. Foley catheter in the decompressed thick-walled bladder. Recommend correlation with urinalysis to  exclude cystitis. 2. Large colonic stool load.  No evidence of obstruction. Electronically Signed   By: Minerva Fester M.D.   On: 03/04/2023 19:30   DG Chest Port 1 View  Result Date: 03/04/2023 CLINICAL DATA:  Constipation.  Tachycardia.  UTI EXAM: PORTABLE CHEST 1 VIEW COMPARISON:  Chest CT 05/23/2022 FINDINGS: Film is rotated to the left and underinflated. Particular volume loss of the left hemithorax with some basilar atelectasis or scar. No consolidation, pneumothorax or edema. Normal  cardiopericardial silhouette. Overlapping cardiac leads. Osteopenia. VP shunt catheter tubing running vertically along the left hemithorax. There are some calcifications along portions of the catheters. Previous rib fractures are not as well seen on this x-ray. IMPRESSION: Rotated radiograph with volume loss and left basilar atelectasis or scar. VP shunt catheters along the left hemithorax. Please correlate with the history. There is some calcifications along the course of the catheters. Electronically Signed   By: Karen Kays M.D.   On: 03/04/2023 19:02   CT L-SPINE NO CHARGE  Result Date: 02/26/2023 CLINICAL DATA:  Urinary retention, constipation.  Abdominal pain. EXAM: CT LUMBAR SPINE WITHOUT CONTRAST TECHNIQUE: Multidetector CT imaging of the lumbar spine was performed without intravenous contrast administration. Multiplanar CT image reconstructions were also generated. RADIATION DOSE REDUCTION: This exam was performed according to the departmental dose-optimization program which includes automated exposure control, adjustment of the mA and/or kV according to patient size and/or use of iterative reconstruction technique. COMPARISON:  CT abdomen and pelvis today and 01/29/2022 FINDINGS: Segmentation: 5 lumbar type vertebrae. Alignment: Severe convex rightward scoliosis.  No subluxation Vertebrae: No acute fracture or focal pathologic process. Paraspinal and other soft tissues: Negative. Disc levels: Degenerative spurring. Disc spaces maintained. No disc herniation. IMPRESSION: Severe convex rightward scoliosis in the thoracolumbar spine. Associated degenerative changes. No acute bony abnormality. Electronically Signed   By: Charlett Nose M.D.   On: 02/26/2023 23:21   CT ABDOMEN PELVIS W CONTRAST  Result Date: 02/26/2023 CLINICAL DATA:  Abdominal pain, acute, nonlocalized upper abd pain, VP shunt in place, urinary retention, constipation EXAM: CT ABDOMEN AND PELVIS WITH CONTRAST TECHNIQUE: Multidetector CT  imaging of the abdomen and pelvis was performed using the standard protocol following bolus administration of intravenous contrast. RADIATION DOSE REDUCTION: This exam was performed according to the departmental dose-optimization program which includes automated exposure control, adjustment of the mA and/or kV according to patient size and/or use of iterative reconstruction technique. CONTRAST:  80mL OMNIPAQUE IOHEXOL 300 MG/ML  SOLN COMPARISON:  01/29/2022 FINDINGS: Lower chest: Bibasilar atelectasis.  No effusions. Hepatobiliary: No focal liver abnormality is seen. Status post cholecystectomy. No biliary dilatation. Pancreas: No focal abnormality or ductal dilatation. Spleen: No focal abnormality.  Normal size. Adrenals/Urinary Tract: Bilateral renal lower pole cysts, the largest in the right kidney measuring up to 5.4 cm, stable. No follow-up imaging recommended. No stones or hydronephrosis. Adrenal glands unremarkable. Urinary bladder decompressed with Foley catheter in place. Stomach/Bowel: Normal appendix. Moderate stool burden throughout the colon. Stomach, large and small bowel grossly unremarkable. Vascular/Lymphatic: No evidence of aneurysm or adenopathy. Reproductive: Prostate enlargement Other: No free fluid or free air. VP shunt in place which terminates in the left mid abdomen. Musculoskeletal: No acute bony abnormality. IMPRESSION: No acute findings in the abdomen or pelvis. Moderate stool burden. VP shunt in place, terminating in the left mid abdomen. Catheter appears intact. Bibasilar atelectasis. Electronically Signed   By: Charlett Nose M.D.   On: 02/26/2023 23:12    Microbiology: Recent Results (from the past  240 hour(s))  Urine Culture     Status: None   Collection Time: 02/26/23  9:31 PM   Specimen: Urine, Catheterized  Result Value Ref Range Status   Specimen Description   Final    URINE, CATHETERIZED Performed at [facility], [address], [place], [place] [address]     Special Requests   Final    NONE Performed at [facility], [address] [place], [place] [address]    Culture   Final    NO GROWTH Performed at [facility], [address], [place], [place] [address]    Report Status 02/28/2023 FINAL  Final  Culture, blood (Routine x 2)     Status: None (Preliminary result)   Collection Time: 03/04/23  2:15 PM   Specimen: BLOOD  Result Value Ref Range Status   Specimen Description BLOOD LEFT ANTECUBITAL  Final   Special Requests   Final    BOTTLES DRAWN AEROBIC AND ANAEROBIC Blood Culture adequate volume   Culture   Final    NO GROWTH < 24 HOURS Performed at [facility], [address] [place], [place] [address]    Report Status PENDING  Incomplete  Culture, blood (Routine x 2)     Status: None (Preliminary result)   Collection Time: 03/04/23  6:23 PM   Specimen: BLOOD  Result Value Ref Range Status   Specimen Description BLOOD BLOOD RIGHT HAND  Final   Special Requests   Final    BOTTLES DRAWN AEROBIC AND ANAEROBIC Blood Culture adequate volume   Culture   Final    NO GROWTH < 12 HOURS Performed at [facility], [address] [place], [place] [address]    Report Status PENDING  Incomplete     Labs: Basic Metabolic Panel: Recent Labs  Lab 02/26/23 2041 03/04/23 1415 03/04/23 2053 03/05/23 0445  NA 130* 131*  --  135  K 3.5 3.4*  --  3.8  CL 97* 98  --  105  CO2 23 25  --  21*  GLUCOSE 94 139*  --  99  BUN 15 10  --  7  CREATININE 0.39* 0.38* 0.38* 0.35*  CALCIUM 8.7* 9.0  --  8.2*  MG  --   --   --  2.2  PHOS  --   --   --  3.1   Liver Function Tests: Recent Labs  Lab 02/26/23 2041 03/04/23 1415 03/05/23 0445  AST 22 25 20   ALT 50* 28 25  ALKPHOS 112 80 72  BILITOT 1.1 0.4 0.5  PROT 7.4 7.3 6.3*  ALBUMIN 3.7 3.7 3.2*   No results for input(s): "LIPASE", "AMYLASE" in the last 168 hours. No results for input(s): "AMMONIA" in the last 168 hours. CBC: Recent Labs  Lab  02/26/23 2041 03/04/23 1415 03/04/23 2053 03/05/23 0445  WBC 18.8* 7.7 6.7 5.2  NEUTROABS 16.0* 5.3  --   --   HGB 13.5 13.8 13.6 12.7*  HCT 40.1 39.7 38.6* 36.7*  MCV 90.7 88.2 88.1 89.3  PLT 307 512* 485* 483*   Cardiac Enzymes: No results for input(s): "CKTOTAL", "CKMB", "CKMBINDEX", "TROPONINI" in the last 168 hours. BNP: BNP (last 3 results) No results for input(s): "BNP" in the last 8760 hours.  ProBNP (last 3 results) No results for input(s): "PROBNP" in the last 8760 hours.  CBG: No results for input(s): "GLUCAP" in the last 168 hours.     Signed:  Silvano Bilis MD.  [facility] 03/05/2023, 10:33 AM

## 2023-03-05 NOTE — Patient Outreach (Signed)
  Care Coordination   03/05/2023 Name: Bradley [name] MRN: [id number] DOB: 1976-08-19   Care Coordination Outreach Attempts:  An unsuccessful telephone outreach was attempted today to offer the patient information about available care coordination services.  Follow Up Plan:  Additional outreach attempts will be made to offer the patient care coordination information and services.   Encounter Outcome:  No Answer   Care Coordination Interventions:  No, not indicated    Reece Levy, MSW, LCSW Clinical Social Worker [facility] [phone]

## 2023-03-05 NOTE — Care Management CC44 (Signed)
Condition Code 44 Documentation Completed  Patient Details  Name: Bradley [name] MRN: [id number] Date of Birth: 21-Jan-1976   Condition Code 44 given:  Yes Patient signature on Condition Code 44 notice:  Yes Documentation of 2 MD's agreement:  Yes Code 44 added to claim:  Yes    Darolyn Rua, LCSW 03/05/2023, 11:22 AM

## 2023-03-05 NOTE — Care Management Obs Status (Signed)
MEDICARE OBSERVATION STATUS NOTIFICATION   Patient Details  Name: Bradley [name] MRN: [id number] Date of Birth: [birth year]   Medicare Observation Status Notification Given:  Yes    Darolyn Rua, LCSW 03/05/2023, 11:22 AM

## 2023-03-05 NOTE — ED Notes (Signed)
Reposition the patient at this time.

## 2023-03-05 NOTE — NC FL2 (Signed)
[place] MEDICAID FL2 LEVEL OF CARE FORM     IDENTIFICATION  Patient Name: Bradley [name] Birthdate: 1975-10-11 Sex: male Admission Date (Current Location): 03/04/2023  [facility] and [place] Number:  Chiropodist and Address:  [facility], [address] , [place] [address]      Provider Number: [id number]  Attending Physician Name and Address:  Kathrynn Running, MD  Relative Name and Phone Number:  Alvino Chapel  [phone]    Current Level of Care: Hospital Recommended Level of Care: Skilled Nursing Facility, Other (Comment) (long term care) Prior Approval Number:    Date Approved/Denied:   PASRR Number: [id number]  Discharge Plan: SNF    Current Diagnoses: Patient Active Problem List   Diagnosis Date Noted   Complicated UTI (urinary tract infection) 03/04/2023   Acute pain of right knee 08/10/2022   Neuromuscular scoliosis of thoracolumbar region 07/05/2022   Severe malnutrition (HCC) 07/05/2022   Acute bacterial conjunctivitis of both eyes 06/20/2022   Intertrigo 05/14/2022   Abrasion of left leg 04/29/2022   Fracture of one rib, right side, initial encounter for closed fracture 04/29/2022   Other specified disorders of bone density and structure, other site 04/29/2022   Leg wound, left, subsequent encounter 04/25/2022   Spontaneous fracture 04/20/2022   Abdominal pain, generalized 04/20/2022   At high risk for aspiration 04/06/2022   Primary insomnia 03/26/2022   History of decubitus ulcer 03/26/2022   Helicobacter pylori (H. 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   Hydrocephalus in diseases classified elsewhere (HCC) 02/11/2022   Encounter for Medicare annual wellness exam 02/08/2022   Epigastric abdominal pain 02/08/2022   Epigastric abdominal tenderness without rebound tenderness 12/27/2021   Benign prostatic hyperplasia with incomplete bladder emptying 12/27/2021   Spastic quadriplegic  cerebral palsy (HCC) 07/15/2021   Bilateral impacted cerumen 07/15/2021   Screening cholesterol level 07/15/2021   Moderate protein-calorie malnutrition (HCC) 07/15/2021   Developmental delay 12/17/2016    Orientation RESPIRATION BLADDER Height & Weight      (developmentally delayed)  Normal Incontinent, External catheter Weight: 110 lb (49.9 kg) Height:  5\' 2"  (157.5 cm)  BEHAVIORAL SYMPTOMS/MOOD NEUROLOGICAL BOWEL NUTRITION STATUS      Continent Diet (see discharge summary)  AMBULATORY STATUS COMMUNICATION OF NEEDS Skin   Extensive Assist Verbally Normal                       Personal Care Assistance Level of Assistance  Bathing, Feeding, Dressing, Total care Bathing Assistance: Maximum assistance Feeding assistance: Maximum assistance Dressing Assistance: Maximum assistance Total Care Assistance: Maximum assistance   Functional Limitations Info  Sight, Hearing, Speech Sight Info: Impaired (legally blind) Hearing Info: Impaired Speech Info: Impaired    SPECIAL CARE FACTORS FREQUENCY                       Contractures Contractures Info: Not present    Additional Factors Info  Code Status, Allergies Code Status Info: full Allergies Info: morphine           Current Medications (03/05/2023):  This is the current hospital active medication list Current Facility-Administered Medications  Medication Dose Route Frequency Provider Last Rate Last Admin   0.9 %  sodium chloride infusion   Intravenous Continuous Willeen Niece, MD 75 mL/hr at 03/04/23 2105 New Bag at 03/04/23 2105   acetaminophen (TYLENOL) tablet 650 mg  650 mg Oral Q6H PRN Willeen Niece, MD   [phone]  mg at 03/04/23 2101   Or   acetaminophen (TYLENOL) suppository 650 mg  650 mg Rectal Q6H PRN Willeen Niece, MD       baclofen (LIORESAL) tablet 20 mg  20 mg Oral QID Willeen Niece, MD   20 mg at 03/05/23 1007   cefTRIAXone (ROCEPHIN) 1 g in sodium chloride 0.9 % 100 mL IVPB  1 g Intravenous Q24H  Nazari, Walid A, RPH       docusate sodium (COLACE) capsule 100 mg  100 mg Oral BID Willeen Niece, MD   100 mg at 03/05/23 1008   enoxaparin (LOVENOX) injection 40 mg  40 mg Subcutaneous Q24H Willeen Niece, MD   40 mg at 03/04/23 2234   [START ON 03/10/2023] fluconazole (DIFLUCAN) tablet 100 mg  100 mg Oral Weekly Willeen Niece, MD       ondansetron (ZOFRAN) tablet 4 mg  4 mg Oral Q6H PRN Willeen Niece, MD       Or   ondansetron (ZOFRAN) injection 4 mg  4 mg Intravenous Q6H PRN Willeen Niece, MD       polyethylene glycol (MIRALAX / GLYCOLAX) packet 17 g  17 g Oral Daily Willeen Niece, MD   17 g at 03/05/23 1012   QUEtiapine (SEROQUEL) tablet 25 mg  25 mg Oral QHS Willeen Niece, MD   25 mg at 03/04/23 2117   senna (SENOKOT) tablet 8.6 mg  1 tablet Oral BID Willeen Niece, MD   8.6 mg at 03/05/23 1009   tamsulosin (FLOMAX) capsule 0.4 mg  0.4 mg Oral Daily Willeen Niece, MD   0.4 mg at 03/05/23 1009   triamcinolone acetonide (KENALOG-40) injection 80 mg  80 mg Intra-articular Once CoxFritzi Mandes, MD       Current Outpatient Medications  Medication Sig Dispense Refill   acetaminophen (TYLENOL) 500 MG tablet Take 500 mg by mouth every 6 (six) hours as needed for headache, fever, moderate pain or mild pain.     baclofen (LIORESAL) 20 MG tablet Take 1 tablet (20 mg total) by mouth 4 (four) times daily. 360 each 1   chlorhexidine (PERIDEX) 0.12 % solution Use as directed 15 mLs in the mouth or throat 2 (two) times daily. (Patient taking differently: Use as directed 15 mLs in the mouth or throat daily as needed.) 120 mL 5   ciprofloxacin (CIPRO) 500 MG tablet Take 500 mg by mouth 2 (two) times daily.     fluconazole (DIFLUCAN) 100 MG tablet Take 1 tablet (100 mg total) by mouth daily. (Patient taking differently: Take 100 mg by mouth once a week.) 7 tablet 1   loratadine (KLS ALLERCLEAR) 10 MG tablet Take 10 mg by mouth daily.     nystatin (MYCOSTATIN/NYSTOP) powder Apply 1 Application  topically daily.     QUEtiapine (SEROQUEL) 25 MG tablet Take 25 mg by mouth at bedtime.     senna (SENOKOT) 8.6 MG TABS tablet Take 1 tablet (8.6 mg total) by mouth daily. 120 tablet 0   tamsulosin (FLOMAX) 0.4 MG CAPS capsule Take 1 capsule (0.4 mg total) by mouth daily. (Patient taking differently: Take 0.4 mg by mouth at bedtime.) 30 capsule 1   polyethylene glycol powder (GLYCOLAX/MIRALAX) 17 GM/SCOOP powder Take 34 g by mouth daily. 255 g 3   Vitamin D, Ergocalciferol, (DRISDOL) 1.25 MG (50000 UNIT) CAPS capsule Take 1 capsule (50,000 Units total) by mouth every 7 (seven) days. (Patient not taking: Reported on 03/04/2023) 5 capsule 2     Discharge Medications: Please see  discharge summary for a list of discharge medications.  Relevant Imaging Results:  Relevant Lab Results:   Additional Information SSN: [id number]  Darolyn Rua, LCSW

## 2023-03-05 NOTE — ED Notes (Signed)
Patient placed on a bedpan.

## 2023-03-06 ENCOUNTER — Ambulatory Visit: Payer: Self-pay | Admitting: *Deleted

## 2023-03-06 DIAGNOSIS — G809 Cerebral palsy, unspecified: Secondary | ICD-10-CM | POA: Diagnosis not present

## 2023-03-06 DIAGNOSIS — K5909 Other constipation: Secondary | ICD-10-CM | POA: Diagnosis not present

## 2023-03-06 DIAGNOSIS — R339 Retention of urine, unspecified: Secondary | ICD-10-CM | POA: Diagnosis not present

## 2023-03-06 DIAGNOSIS — E559 Vitamin D deficiency, unspecified: Secondary | ICD-10-CM | POA: Diagnosis not present

## 2023-03-06 LAB — HIV ANTIBODY (ROUTINE TESTING W REFLEX): HIV Screen 4th Generation wRfx: NONREACTIVE

## 2023-03-06 NOTE — Patient Outreach (Signed)
  Care Coordination   Initial Visit Note   03/06/2023 Name: Bradley [name] MRN: [id number] DOB: August 08, 1976  Bradley [name] is a 47 y.o. year old male who sees Danella Penton, MD for primary care. I  spoke to pt's mother/caregiver, Alvino Chapel, by phone.   What matters to the patients health and wellness today?  Pt has been moved to [facility] rehab.     Goals Addressed   None     SDOH assessments and interventions completed:  No     Care Coordination Interventions:  No, not indicated   Follow up plan: No further intervention required.   Encounter Outcome:  Pt. Visit Completed

## 2023-03-07 DIAGNOSIS — G809 Cerebral palsy, unspecified: Secondary | ICD-10-CM | POA: Diagnosis not present

## 2023-03-07 DIAGNOSIS — K5900 Constipation, unspecified: Secondary | ICD-10-CM | POA: Diagnosis not present

## 2023-03-08 ENCOUNTER — Other Ambulatory Visit: Payer: Self-pay | Admitting: *Deleted

## 2023-03-08 LAB — CULTURE, BLOOD (ROUTINE X 2)
Culture: NO GROWTH
Special Requests: ADEQUATE

## 2023-03-08 NOTE — Patient Outreach (Signed)
Mr. Fan recently admitted to [facility] skilled nursing facility. He was active with [facility] Care Management team prior. Update received from [facility] Social Worker indicating plan has been for long term care post skilled stay.   Writer attempted reach [employer], [facility] psychotherapist by phone. No answer. Voicemail message left to make aware of prior plan for longterm care. Also sent secure message indicating this.   Will plan outreach to Mr. Parrow's mother, Alvino Chapel.   Will continue to follow while Mr. Cobern resides in skilled nursing facility.   Raiford Noble, MSN, RN,BSN [facility] Post Acute Care Coordinator [phone] (Direct dial)

## 2023-03-08 NOTE — Consult Note (Signed)
   [facility] CM Inpatient Consult   03/08/2023  Marcelo Ickes 05/05/1976 [id number]  Primary Care Provider:  Dr. Bethann Punches  Patient is currently active with [facility] [[facility]] Care Management for chronic disease management services.  Patient has been engaged by a [facility] and CSW.  Our community based plan of care has focused on disease management and community resource support.   [facility] liaison collaborated with active [facility] team members and PAC-RN for a referral for post discharge follow up at the SNF level of as pt d/c to [facility] for ongoing rehab.  Of note, [facility] Care Management services does not replace or interfere with any services that are needed or arranged by inpatient [facility] care management team.   For additional questions or referrals please contact:  Elliot Cousin, RN, BSN [facility] Liaison [place]   [facility]  Population Health Office Hours MTWF  8:00 am-6:00 pm Off on Thursday [phone] mobile [phone] [Office toll free line][facility] Office Hours are M-F 8:30 - 5 pm 24 hour nurse advise line [phone] Concierge  [email or web address]

## 2023-03-09 LAB — CULTURE, BLOOD (ROUTINE X 2)
Culture: NO GROWTH
Special Requests: ADEQUATE

## 2023-03-11 DIAGNOSIS — E871 Hypo-osmolality and hyponatremia: Secondary | ICD-10-CM | POA: Diagnosis not present

## 2023-03-11 DIAGNOSIS — G809 Cerebral palsy, unspecified: Secondary | ICD-10-CM | POA: Diagnosis not present

## 2023-03-11 DIAGNOSIS — R339 Retention of urine, unspecified: Secondary | ICD-10-CM | POA: Diagnosis not present

## 2023-03-11 DIAGNOSIS — K5900 Constipation, unspecified: Secondary | ICD-10-CM | POA: Diagnosis not present

## 2023-03-13 ENCOUNTER — Other Ambulatory Visit: Payer: Self-pay | Admitting: *Deleted

## 2023-03-13 NOTE — Patient Outreach (Signed)
Per [facility] Bradley [name] resides in [name]. Screening for potential [facility] care coordination services as benefit of health plan and Primary Care Provider.   Telephone call made to Bradley [name] (mom/DPR) [phone]. Patient identifiers confirmed. Bradley [name] reports she is working with [name] social work Scientist, physiological for long term care placement. Bradley [name] reports she is working on getting the paper work to Bradley [name]. Bradley [name] denies needing any assist or having any concerns at this current time. Clinical research associate provided contact information to Bradley [name].  Will continue to follow and attempt collaboration with Bradley [name] social worker.   Bradley Noble, MSN, RN,BSN [facility] Post Acute Care Coordinator [phone] (Direct dial)

## 2023-03-14 ENCOUNTER — Ambulatory Visit: Payer: Medicare Other | Admitting: Physician Assistant

## 2023-03-14 ENCOUNTER — Encounter: Payer: Self-pay | Admitting: Urology

## 2023-03-14 ENCOUNTER — Ambulatory Visit: Payer: Medicare Other | Admitting: Urology

## 2023-03-14 VITALS — Ht <= 58 in | Wt 100.0 lb

## 2023-03-14 DIAGNOSIS — E871 Hypo-osmolality and hyponatremia: Secondary | ICD-10-CM

## 2023-03-14 DIAGNOSIS — K5900 Constipation, unspecified: Secondary | ICD-10-CM | POA: Diagnosis not present

## 2023-03-14 DIAGNOSIS — R339 Retention of urine, unspecified: Secondary | ICD-10-CM | POA: Diagnosis not present

## 2023-03-14 DIAGNOSIS — K5909 Other constipation: Secondary | ICD-10-CM | POA: Diagnosis not present

## 2023-03-14 LAB — BLADDER SCAN AMB NON-IMAGING: Scan Result: 424

## 2023-03-14 NOTE — Patient Instructions (Addendum)
Continue Flomax (tamsulosin) 0.4mg  daily Continue Miralax (polyethylene glycol) 34g daily Recheck serum sodium Continue Foley catheter, wash the exposed part of the catheter tubing with warm, soapy water once daily. Return to clinic in 2 weeks for repeat voiding trial I've placed a referral for GI today; they will call you to schedule this

## 2023-03-14 NOTE — Progress Notes (Signed)
Catheter Removal  Patient is present today for a catheter removal.  8ml of water was drained from the balloon. A 16FR foley cath was removed from the bladder, no complications were noted. Patient tolerated well.  Performed by:   CMA  Follow up/ Additional notes: This afternoon   

## 2023-03-14 NOTE — Progress Notes (Signed)
Marcelle Overlie Plume,acting as a scribe for Riki Altes, MD.,have documented all relevant documentation on the behalf of Riki Altes, MD,as directed by  Riki Altes, MD while in the presence of Riki Altes, MD.  03/14/2023 8:36 AM   Tia Alert Mar 31, 1976 [id number]  Referring provider: Danella Penton, MD [phone] [facility] [address] [facility] [place],  [place] [address]  Chief Complaint  Patient presents with   Urinary Retention    HPI: Bradley [name] is a 47 y.o. male who presents for follow up of recent episode of urinary retention.  History of cerebral palsy, seizures, and chronic idiopathic constipation.  Initially seen in the ED 02/26/2023 with worsening constipation and inability to urinate. He had been started on antibiotics 3 days prior to his ED presentation for a possible UTI. Evaluation in the ED showed a bladder scan volume of 540 mL and a Foley catheter was placed. Was subsequently seen back in the ED 03/04/2023 with abdominal pain and questionable non-draining Foley catheter. He was admitted for a complicated UTI, though on chart review, it does not appear that he was having any significant UTI symptoms. Diagnosis of UTI was based on his urinalysis.  He presents today with family members who provided the history. No previous episodes of urinary retention, however, he did complain of urinary frequency and difficulty emptying his bladder. Admission for UTI/sepsis in 2019 No previous history of urinary incontinence He was discharged on tamsulosin   PMH: Past Medical History:  Diagnosis Date   Allergy    Blood transfusion without reported diagnosis    Cerebral palsy, hemiplegic (HCC)    Chronic idiopathic constipation    Fracture    4 fractured riibs   Neuromuscular disorder (HCC)    Seizure disorder (HCC)     Surgical History: Past Surgical History:  Procedure Laterality Date   ADENOIDECTOMY     CHOLECYSTECTOMY      cranial-peritoneal shunt     TONSILLECTOMY      Home Medications:  Allergies as of 03/14/2023       Reactions   Morphine Other (See Comments)   Other reaction(s): Unknown Had a hard time waking up after surgery. Had a hard time waking up after surgery.        Medication List        Accurate as of March 14, 2023  8:36 AM. If you have any questions, ask your nurse or doctor.          acetaminophen 500 MG tablet Commonly known as: TYLENOL Take 500 mg by mouth every 6 (six) hours as needed for headache, fever, moderate pain or mild pain.   baclofen 20 MG tablet Commonly known as: LIORESAL Take 1 tablet (20 mg total) by mouth 4 (four) times daily.   chlorhexidine 0.12 % solution Commonly known as: PERIDEX Use as directed 15 mLs in the mouth or throat 2 (two) times daily. What changed:  when to take this reasons to take this   ciprofloxacin 500 MG tablet Commonly known as: CIPRO Take 500 mg by mouth 2 (two) times daily.   fluconazole 100 MG tablet Commonly known as: Diflucan Take 1 tablet (100 mg total) by mouth daily. What changed: when to take this   KLS AllerClear 10 MG tablet Generic drug: loratadine Take 10 mg by mouth daily.   nystatin powder Commonly known as: MYCOSTATIN/NYSTOP Apply 1 Application topically daily.   polyethylene glycol powder 17 GM/SCOOP powder Commonly known as: GLYCOLAX/MIRALAX  Take 34 g by mouth daily.   QUEtiapine 25 MG tablet Commonly known as: SEROQUEL Take 25 mg by mouth at bedtime.   senna 8.6 MG Tabs tablet Commonly known as: SENOKOT Take 1 tablet (8.6 mg total) by mouth daily.   tamsulosin 0.4 MG Caps capsule Commonly known as: FLOMAX Take 1 capsule (0.4 mg total) by mouth daily. What changed: when to take this   Vitamin D (Ergocalciferol) 1.25 MG (50000 UNIT) Caps capsule Commonly known as: DRISDOL Take 1 capsule (50,000 Units total) by mouth every 7 (seven) days.        Allergies:  Allergies  Allergen  Reactions   Morphine Other (See Comments)    Other reaction(s): Unknown Had a hard time waking up after surgery. Had a hard time waking up after surgery.     Family History: Family History  Problem Relation Age of Onset   Hyperlipidemia Mother    Hypertension Mother    Ulcerative colitis Mother    Diabetes Mother    Migraines Mother    Osteoarthritis Mother    Diabetes Father    Hypertension Father    Migraines Sister    Diabetes Maternal Uncle    Breast cancer Maternal Grandmother    Heart attack Maternal Grandmother    Diabetes Maternal Grandfather    Heart attack Maternal Great-grandfather    Cancer Other        Prostate and Breast   Colon cancer Neg Hx    Esophageal cancer Neg Hx    Rectal cancer Neg Hx    Stomach cancer Neg Hx     Social History:  reports that he has never smoked. He has never used smokeless tobacco. He reports that he does not drink alcohol and does not use drugs.   Physical Exam: Ht 4\' 10"  (1.473 m)   Wt 100 lb (45.4 kg)   BMI 20.90 kg/m   Constitutional:  Alert, No acute distress. HEENT: [place] AT Respiratory: Normal respiratory effort, no increased work of breathing. GU: Foley catheter draining clear urine   Pertinent Imaging: CT abdomen pelvis performed on 03/04/2023 was personally reviewed and interpreted. Prostate volume calculated at 46 cc's (ellipsoid) and 57 cc's (bullet)  EXAM: CT ABDOMEN AND PELVIS WITH CONTRAST   TECHNIQUE: Multidetector CT imaging of the abdomen and pelvis was performed using the standard protocol following bolus administration of intravenous contrast.   RADIATION DOSE REDUCTION: This exam was performed according to the departmental dose-optimization program which includes automated exposure control, adjustment of the mA and/or kV according to patient size and/or use of iterative reconstruction technique.   CONTRAST:  100 mL OMNIPAQUE IOHEXOL 300 MG/ML  SOLN   COMPARISON:  CT abdomen and pelvis  02/26/2023   FINDINGS: Lower chest: No acute abnormality.  Bibasilar atelectasis.   Hepatobiliary: No focal liver abnormality is seen. Status post cholecystectomy. Prominent common bile duct compatible with reservoir effect from cholecystectomy.   Pancreas: Unremarkable.   Spleen: Unremarkable.   Adrenals/Urinary Tract: Stable adrenal glands. Low-attenuation lesions in the kidneys are statistically likely to represent cysts. No follow-up is required. No urinary calculi or hydronephrosis. Foley catheter in the decompressed thick-walled bladder.   Stomach/Bowel: Normal caliber large and small bowel. Large colonic stool load. Normal appendix. No bowel wall thickening. Stomach is within normal limits.   Vascular/Lymphatic: No significant vascular findings are present. No enlarged abdominal or pelvic lymph nodes.   Reproductive: Enlarged prostate.   Other: No free intraperitoneal fluid or air. Optimally visualized VP shunt.  Musculoskeletal: No acute fracture. Right convex thoracolumbar curve. Right hip dysplasia.   IMPRESSION: 1. Foley catheter in the decompressed thick-walled bladder. Recommend correlation with urinalysis to exclude cystitis. 2. Large colonic stool load.  No evidence of obstruction.     Electronically Signed   By: Minerva Fester M.D.   On: 03/04/2023 19:30  Assessment & Plan:    1. Urinary retention Most likely multifactorial, secondary to chronic constipation and BPH He was discharged from the hospital on tamsulosin, which he has continued Recommend cath removal for voiding trial. He is scheduled for a follow up visit this afternoon for a bladder scan. Continue tamsulosin We discussed other options, including chronic indwelling catheter and suprapubic tube. He does have contractures, which may make outlet procedures difficult. May be able to consider PAE.   I have reviewed the above documentation for accuracy and completeness, and I agree with the  above.   Riki Altes, MD  [facility] [address], [address] [place], [place] [address] [phone]

## 2023-03-14 NOTE — Progress Notes (Signed)
03/14/2023 4:39 PM   Bradley [name] Dec 15, 1975 [id number]  CC: Chief Complaint  Patient presents with   Follow-up    PM voiding trial    HPI: Bradley [name] is a 47 y.o. male with PMH CP, seizures, BPH, and chronic idiopathic constipation who presents today for afternoon PVR after seeing Dr. Lonna Cobb this morning for evaluation of urinary retention with Foley removal.   He is accompanied in clinic this afternoon by his sister and mom, who contribute to HPI.  He was able to void small volumes today.  Bladder scan on arrival 424 mL.  He was able to spontaneously void with repeat bladder scan 392 mL.  He denies abdominal pain.  Family has concerns today regarding the etiology of his urinary retention.  They are unsure if he is on a bowel regimen, though per chart review it appears he has been prescribed MiraLAX 2 doses daily.  He has seen GI, but never for constipation.    He is currently residing at [facility].  They report he has a recent history of hyponatremia with serum sodium 127.  This has not been rechecked to their knowledge.  Intervention unclear.  PMH: Past Medical History:  Diagnosis Date   Allergy    Blood transfusion without reported diagnosis    Cerebral palsy, hemiplegic (HCC)    Chronic idiopathic constipation    Fracture    4 fractured riibs   Neuromuscular disorder (HCC)    Seizure disorder [facility])     Surgical History: Past Surgical History:  Procedure Laterality Date   ADENOIDECTOMY     CHOLECYSTECTOMY     cranial-peritoneal shunt     TONSILLECTOMY      Home Medications:  Allergies as of 03/14/2023       Reactions   Morphine Other (See Comments)   Other reaction(s): Unknown Had a hard time waking up after surgery. Had a hard time waking up after surgery.        Medication List        Accurate as of March 14, 2023  4:39 PM. If you have any questions, ask your nurse or doctor.          acetaminophen 500 MG tablet Commonly known  as: TYLENOL Take 500 mg by mouth every 6 (six) hours as needed for headache, fever, moderate pain or mild pain.   baclofen 20 MG tablet Commonly known as: LIORESAL Take 1 tablet (20 mg total) by mouth 4 (four) times daily.   chlorhexidine 0.12 % solution Commonly known as: PERIDEX Use as directed 15 mLs in the mouth or throat 2 (two) times daily. What changed:  when to take this reasons to take this   ciprofloxacin 500 MG tablet Commonly known as: CIPRO Take 500 mg by mouth 2 (two) times daily.   fluconazole 100 MG tablet Commonly known as: Diflucan Take 1 tablet (100 mg total) by mouth daily. What changed: when to take this   KLS AllerClear 10 MG tablet Generic drug: loratadine Take 10 mg by mouth daily.   nystatin powder Commonly known as: MYCOSTATIN/NYSTOP Apply 1 Application topically daily.   polyethylene glycol powder 17 GM/SCOOP powder Commonly known as: GLYCOLAX/MIRALAX Take 34 g by mouth daily.   QUEtiapine 25 MG tablet Commonly known as: SEROQUEL Take 25 mg by mouth at bedtime.   senna 8.6 MG Tabs tablet Commonly known as: SENOKOT Take 1 tablet (8.6 mg total) by mouth daily.   tamsulosin 0.4 MG Caps capsule Commonly known as: FLOMAX  Take 1 capsule (0.4 mg total) by mouth daily. What changed: when to take this   Vitamin D (Ergocalciferol) 1.25 MG (50000 UNIT) Caps capsule Commonly known as: DRISDOL Take 1 capsule (50,000 Units total) by mouth every 7 (seven) days.        Allergies:  Allergies  Allergen Reactions   Morphine Other (See Comments)    Other reaction(s): Unknown Had a hard time waking up after surgery. Had a hard time waking up after surgery.     Family History: Family History  Problem Relation Age of Onset   Hyperlipidemia Mother    Hypertension Mother    Ulcerative colitis Mother    Diabetes Mother    Migraines Mother    Osteoarthritis Mother    Diabetes Father    Hypertension Father    Migraines Sister    Diabetes  Maternal Uncle    Breast cancer Maternal Grandmother    Heart attack Maternal Grandmother    Diabetes Maternal Grandfather    Heart attack Maternal Great-grandfather    Cancer Other        Prostate and Breast   Colon cancer Neg Hx    Esophageal cancer Neg Hx    Rectal cancer Neg Hx    Stomach cancer Neg Hx     Social History:   reports that he has never smoked. He has never used smokeless tobacco. He reports that he does not drink alcohol and does not use drugs.  Physical Exam: There were no vitals taken for this visit.  Constitutional:  [name], no acute distress, nontoxic appearing HEENT: [place], AT Cardiovascular: No cyanosis, or edema Respiratory: Normal respiratory effort, no increased work of breathing Skin: No rashes, bruises or suspicious lesions Neurologic: In wheelchair, upper extremity contractures Psychiatric: Normal mood and affect  Laboratory Data: Results for orders placed or performed in visit on 03/14/23  BLADDER SCAN AMB NON-IMAGING  Result Value Ref Range   Scan Result 424 ml    Simple Catheter Placement  Due to urinary retention patient is present today for a foley cath placement.  Patient was cleaned and prepped in a sterile fashion with betadine and 2% lidocaine jelly was instilled into the urethra. A 16 FR coude foley catheter was inserted, urine return was noted , urine was clear in color.  The balloon was filled with 10cc of sterile water.  A night bag was attached for drainage. Patient tolerated well, no complications were noted   Performed by: Carman Ching, PA-C , Domingo Cocking, CMA, and Humberta Magallon-Mariche, CMA  Assessment & Plan:   1. Urinary retention Voiding trial failed.  I offered him Foley catheter replacement and they accepted.  CIC not feasible given contractures.  See procedure note above.  We had a lengthy conversation and I agree with Dr. Lonna Cobb that is urinary retention is likely multifactorial in the setting of  constipation and BPH, possibly also CP.  We discussed various options including optimizing bowel regimen with repeat voiding trial on Flomax in 2 weeks, chronic Foley, chronic suprapubic tube, or consideration of PAE.  They would like to repeat a voiding trial in 2 weeks. - BLADDER SCAN AMB NON-IMAGING  2. Chronic constipation Continue MiraLAX as prescribed.  GI referral placed today.  Will repeat voiding trial in 2 weeks. - Ambulatory referral to Gastroenterology  3. Hyponatremia Recommend rechecking serum sodium at his facility.  Return in about 2 weeks (around 03/28/2023) for Voiding trial.  Carman Ching, PA-C  [facility] [place] [address]  [address] [place], [place] [address] [phone]

## 2023-03-18 DIAGNOSIS — R339 Retention of urine, unspecified: Secondary | ICD-10-CM | POA: Diagnosis not present

## 2023-03-18 DIAGNOSIS — E871 Hypo-osmolality and hyponatremia: Secondary | ICD-10-CM | POA: Diagnosis not present

## 2023-03-18 DIAGNOSIS — K5900 Constipation, unspecified: Secondary | ICD-10-CM | POA: Diagnosis not present

## 2023-03-20 DIAGNOSIS — R051 Acute cough: Secondary | ICD-10-CM | POA: Diagnosis not present

## 2023-03-20 DIAGNOSIS — R82998 Other abnormal findings in urine: Secondary | ICD-10-CM | POA: Diagnosis not present

## 2023-03-20 DIAGNOSIS — R509 Fever, unspecified: Secondary | ICD-10-CM | POA: Diagnosis not present

## 2023-03-21 ENCOUNTER — Telehealth: Payer: Self-pay

## 2023-03-21 NOTE — Telephone Encounter (Signed)
Patient is a establish patient with [facility] and they are wanting to transfer there care to office. Called the mother to find out why they are wanting to transfer the care and she states that when they went to see [facility] they said he needed a colonoscopy the decided it was needed. They went to see a Urology which place the referral and the urologist states the patient has some constipations and recommends a colonoscopy to be done. Mother wants him to see Dr. Servando Snare. Please advise if the transfer is okay?

## 2023-03-22 DIAGNOSIS — R509 Fever, unspecified: Secondary | ICD-10-CM | POA: Diagnosis not present

## 2023-03-22 DIAGNOSIS — R051 Acute cough: Secondary | ICD-10-CM | POA: Diagnosis not present

## 2023-03-22 DIAGNOSIS — N390 Urinary tract infection, site not specified: Secondary | ICD-10-CM | POA: Diagnosis not present

## 2023-03-22 NOTE — Telephone Encounter (Signed)
Made appointment with Inetta Fermo for consult and if procedure needs to be done would like to be done with Dr. Servando Snare

## 2023-03-25 DIAGNOSIS — E871 Hypo-osmolality and hyponatremia: Secondary | ICD-10-CM | POA: Diagnosis not present

## 2023-03-25 DIAGNOSIS — N390 Urinary tract infection, site not specified: Secondary | ICD-10-CM | POA: Diagnosis not present

## 2023-03-28 ENCOUNTER — Ambulatory Visit (INDEPENDENT_AMBULATORY_CARE_PROVIDER_SITE_OTHER): Payer: Medicare Other | Admitting: Physician Assistant

## 2023-03-28 ENCOUNTER — Ambulatory Visit: Payer: Medicare Other | Admitting: Physician Assistant

## 2023-03-28 ENCOUNTER — Telehealth: Payer: Self-pay | Admitting: Physician Assistant

## 2023-03-28 ENCOUNTER — Other Ambulatory Visit: Payer: Self-pay

## 2023-03-28 DIAGNOSIS — R339 Retention of urine, unspecified: Secondary | ICD-10-CM | POA: Diagnosis not present

## 2023-03-28 DIAGNOSIS — N390 Urinary tract infection, site not specified: Secondary | ICD-10-CM | POA: Diagnosis not present

## 2023-03-28 LAB — BLADDER SCAN AMB NON-IMAGING: Scan Result: 111

## 2023-03-28 MED ORDER — SULFAMETHOXAZOLE-TRIMETHOPRIM 800-160 MG PO TABS
1.0000 | ORAL_TABLET | Freq: Two times a day (BID) | ORAL | 0 refills | Status: AC
Start: 2023-03-28 — End: 2023-03-31

## 2023-03-28 NOTE — Telephone Encounter (Signed)
Patient sister Nedra Hai) came in for an earlier appointment. I am unable to add him for tomorrow at 11:15 am est it won't open. Can you please add him

## 2023-03-28 NOTE — Progress Notes (Signed)
03/28/2023 3:13 PM   Tia Alert 02/25/1976 [id number]  CC: Chief Complaint  Patient presents with   Urinary Retention   HPI: Bradley [name] is a 47 y.o. male with PMH CP, seizures, BPH, and chronic idiopathic constipation with a recent history of urinary retention who presents today for repeat voiding trial after failing a voiding trial with me 2 weeks ago.  He is accompanied today by his sister and mother, who contribute to HPI.  Foley catheter removed in the morning, see separate procedure note for details.  He has voided during the day and had some gross hematuria.  No acute concerns. He reports approximately 40 ounces of fluid intake today.  Afternoon PVR 111 mL.  He remains at [facility].  His sister asks about the risk of prostate cancer.  PSA dated 12/27/2021 was 0.4.  PMH: Past Medical History:  Diagnosis Date   Allergy    Blood transfusion without reported diagnosis    Cerebral palsy, hemiplegic (HCC)    Chronic idiopathic constipation    Fracture    4 fractured riibs   Neuromuscular disorder (HCC)    Seizure disorder [facility])     Surgical History: Past Surgical History:  Procedure Laterality Date   ADENOIDECTOMY     CHOLECYSTECTOMY     cranial-peritoneal shunt     TONSILLECTOMY      Home Medications:  Allergies as of 03/28/2023       Reactions   Morphine Other (See Comments)   Other reaction(s): Unknown Had a hard time waking up after surgery. Had a hard time waking up after surgery.        Medication List        Accurate as of March 28, 2023  3:13 PM. If you have any questions, ask your nurse or doctor.          acetaminophen 500 MG tablet Commonly known as: TYLENOL Take 500 mg by mouth every 6 (six) hours as needed for headache, fever, moderate pain or mild pain.   baclofen 20 MG tablet Commonly known as: LIORESAL Take 1 tablet (20 mg total) by mouth 4 (four) times daily.   chlorhexidine 0.12 % solution Commonly known as:  PERIDEX Use as directed 15 mLs in the mouth or throat 2 (two) times daily. What changed:  when to take this reasons to take this   ciprofloxacin 500 MG tablet Commonly known as: CIPRO Take 500 mg by mouth 2 (two) times daily.   fluconazole 100 MG tablet Commonly known as: Diflucan Take 1 tablet (100 mg total) by mouth daily. What changed: when to take this   KLS AllerClear 10 MG tablet Generic drug: loratadine Take 10 mg by mouth daily.   nystatin powder Commonly known as: MYCOSTATIN/NYSTOP Apply 1 Application topically daily.   ofloxacin 0.3 % ophthalmic solution Commonly known as: OCUFLOX SMARTSIG:In Eye(s)   polyethylene glycol powder 17 GM/SCOOP powder Commonly known as: GLYCOLAX/MIRALAX Take 34 g by mouth daily.   QUEtiapine 25 MG tablet Commonly known as: SEROQUEL Take 25 mg by mouth at bedtime.   senna 8.6 MG Tabs tablet Commonly known as: SENOKOT Take 1 tablet (8.6 mg total) by mouth daily.   sulfamethoxazole-trimethoprim 800-160 MG tablet Commonly known as: BACTRIM DS Take 1 tablet by mouth 2 (two) times daily for 3 days. Started by: Carman Ching, PA-C   tamsulosin 0.4 MG Caps capsule Commonly known as: FLOMAX Take 1 capsule (0.4 mg total) by mouth daily. What changed: when to take this  Vitamin D (Ergocalciferol) 1.25 MG (50000 UNIT) Caps capsule Commonly known as: DRISDOL Take 1 capsule (50,000 Units total) by mouth every 7 (seven) days.        Allergies:  Allergies  Allergen Reactions   Morphine Other (See Comments)    Other reaction(s): Unknown Had a hard time waking up after surgery. Had a hard time waking up after surgery.     Family History: Family History  Problem Relation Age of Onset   Hyperlipidemia Mother    Hypertension Mother    Ulcerative colitis Mother    Diabetes Mother    Migraines Mother    Osteoarthritis Mother    Diabetes Father    Hypertension Father    Migraines Sister    Diabetes Maternal Uncle     Breast cancer Maternal Grandmother    Heart attack Maternal Grandmother    Diabetes Maternal Grandfather    Heart attack Maternal Great-grandfather    Cancer Other        Prostate and Breast   Colon cancer Neg Hx    Esophageal cancer Neg Hx    Rectal cancer Neg Hx    Stomach cancer Neg Hx     Social History:   reports that he has never smoked. He has never used smokeless tobacco. He reports that he does not drink alcohol and does not use drugs.  Physical Exam: There were no vitals taken for this visit.  Constitutional:  Alert, no acute distress, nontoxic appearing HEENT: [place], AT Cardiovascular: No clubbing, cyanosis, or edema Respiratory: Normal respiratory effort, no increased work of breathing Skin: No rashes, bruises or suspicious lesions Neurologic: Grossly intact, no focal deficits, moving all 4 extremities Psychiatric: Normal mood and affect  Laboratory Data: Results for orders placed or performed in visit on 03/28/23  Bladder Scan (Post Void Residual) in office  Result Value Ref Range   Scan Result 111 ml    Assessment & Plan:   1. Urinary retention Voiding trial passed.  Will treat with 3 days of empiric Bactrim for UTI prevention given prolonged catheterization.  Will plan for repeat PVR in 2 to 4 weeks.  Continue Flomax and bowel regimen.  I will attempt to clarify his family history of prostate cancer at his follow-up with me.  If insignificant, I think we can defer repeat PSA testing for now given very low value last year and young age. - Bladder Scan (Post Void Residual) in office - sulfamethoxazole-trimethoprim (BACTRIM DS) 800-160 MG tablet; Take 1 tablet by mouth 2 (two) times daily for 3 days.  Dispense: 6 tablet; Refill: 0  Return in about 3 weeks (around 04/18/2023) for Symptom recheck with PVR.  Carman Ching, PA-C  [facility] [place] [address] [place], [place] [address] [phone]

## 2023-03-28 NOTE — Progress Notes (Signed)
Bradley Amy, PA-C [address]  [address]  [place], [place] [address]  Main: [phone]  Fax: [phone]   Gastroenterology Consultation  Referring Provider:     Danella Penton, MD Primary Care Physician:  Danella Penton, MD Primary Gastroenterologist:  Bradley Amy, PA-C / Dr. Midge Minium  Reason for Consultation:     Constipation, Abdominal Pain, H. Pylori        HPI:   Bradley [name] is a 47 y.o. y/o male referred for consultation & management  by Danella Penton, MD.    Medical history significant for cerebral palsy, wheelchair dependent, seizures, VP shunt, chronic idiopathic constipation, CCY and Appy.  History of UTIs, urinary tract obstruction, and Foley catheter placement.  He went to [facility] ED 03/04/2023 for abdominal pain and Foley catheter possibly dislodged.  Abdominal pelvic CT showed large colonic stool burden.  No bowel obstruction.  Foley catheter was in a decompressed thick-walled bladder.  Concern for cystitis.  Treated with Rocephin.  Current treatment for constipation: Colace 100 mg twice daily, MiraLAX 17 g daily, senna 8.6 mg twice daily.  Current symptoms: Patient is here today with his parents and brother who are helping with his history and care.  Patient has had chronic constipation for many years.  Currently under pretty good control on current treatment.  He has been having generalized abdominal pain worse on his left side abdomen.    He saw [facility], Dr. Orvan Falconer last year for GI workup.  He had 2 negative abdominal pelvic CT scans last year.  Had a positive serum H. pylori test which was treated with amoxicillin, clarithromycin, and PPI.  He is age 12 and has never had a screening colonoscopy.  Dr. Orvan Falconer recommended an EGD and colonoscopy, however patient did not do the procedures.  He has not had any visible rectal bleeding.  He is wheelchair dependent.  In a motorized wheelchair today.  He is not able to communicate with me well  verbally.  Past Medical History:  Diagnosis Date   Allergy    Blood transfusion without reported diagnosis    Cerebral palsy, hemiplegic (HCC)    Chronic idiopathic constipation    Fracture    4 fractured riibs   Neuromuscular disorder (HCC)    Seizure disorder (HCC)     Past Surgical History:  Procedure Laterality Date   ADENOIDECTOMY     CHOLECYSTECTOMY     cranial-peritoneal shunt     TONSILLECTOMY      Prior to Admission medications   Medication Sig Start Date End Date Taking? Authorizing Provider  acetaminophen (TYLENOL) 500 MG tablet Take 500 mg by mouth every 6 (six) hours as needed for headache, fever, moderate pain or mild pain.    [provider]  baclofen (LIORESAL) 20 MG tablet Take 1 tablet (20 mg total) by mouth 4 (four) times daily. 06/20/22   Cox, Fritzi Mandes, MD  chlorhexidine (PERIDEX) 0.12 % solution Use as directed 15 mLs in the mouth or throat 2 (two) times daily. Patient taking differently: Use as directed 15 mLs in the mouth or throat daily as needed. 01/30/22   Cox, Fritzi Mandes, MD  ciprofloxacin (CIPRO) 500 MG tablet Take 500 mg by mouth 2 (two) times daily. 02/25/23   [provider]  fluconazole (DIFLUCAN) 100 MG tablet Take 1 tablet (100 mg total) by mouth daily. Patient taking differently: Take 100 mg by mouth once a week. 08/06/22   Blane Ohara, MD  loratadine [facility])  10 MG tablet Take 10 mg by mouth daily.    [provider]  nystatin (MYCOSTATIN/NYSTOP) powder Apply 1 Application topically daily.    [provider]  ofloxacin (OCUFLOX) 0.3 % ophthalmic solution SMARTSIG:In Eye(s) 10/09/22   [provider]  polyethylene glycol powder (GLYCOLAX/MIRALAX) 17 GM/SCOOP powder Take 34 g by mouth daily. 03/05/23   Wouk, Wilfred Curtis, MD  QUEtiapine (SEROQUEL) 25 MG tablet Take 25 mg by mouth at bedtime.    [provider]  senna (SENOKOT) 8.6 MG TABS tablet Take 1 tablet (8.6 mg total) by mouth daily. 03/05/23    Wouk, Wilfred Curtis, MD  sulfamethoxazole-trimethoprim (BACTRIM DS) 800-160 MG tablet Take 1 tablet by mouth 2 (two) times daily for 3 days. 03/28/23 03/31/23  Carman Ching, PA-C  tamsulosin (FLOMAX) 0.4 MG CAPS capsule Take 1 capsule (0.4 mg total) by mouth daily. Patient taking differently: Take 0.4 mg by mouth at bedtime. 02/27/23   Cuthriell, Delorise Royals, PA-C  Vitamin D, Ergocalciferol, (DRISDOL) 1.25 MG (50000 UNIT) CAPS capsule Take 1 capsule (50,000 Units total) by mouth every 7 (seven) days. 07/10/22   Janie Morning, NP    Family History  Problem Relation Age of Onset   Hyperlipidemia Mother    Hypertension Mother    Ulcerative colitis Mother    Diabetes Mother    Migraines Mother    Osteoarthritis Mother    Diabetes Father    Hypertension Father    Migraines Sister    Diabetes Maternal Uncle    Breast cancer Maternal Grandmother    Heart attack Maternal Grandmother    Diabetes Maternal Grandfather    Heart attack Maternal Great-grandfather    Cancer Other        Prostate and Breast   Colon cancer Neg Hx    Esophageal cancer Neg Hx    Rectal cancer Neg Hx    Stomach cancer Neg Hx      Social History   Tobacco Use   Smoking status: Never   Smokeless tobacco: Never  Vaping Use   Vaping Use: Never used  Substance Use Topics   Alcohol use: Never   Drug use: Never    Allergies as of 03/29/2023 - Review Complete 03/29/2023  Allergen Reaction Noted   Morphine Other (See Comments) 12/17/2016    Review of Systems:    All systems reviewed and negative except where noted in HPI.   Physical Exam:  BP 104/72   Pulse 74   Temp 98.2 F (36.8 C)  No LMP for male patient. Psych:  Normal mood and affect.  He is not able to communicate with me verbally. General:   Alert, sitting in a motorized wheelchair, pleasant and cooperative in NAD Head:  Normocephalic and atraumatic. Eyes:  Sclera clear, no icterus.   Conjunctiva pink. Neck:  Supple; no masses or  thyromegaly. Lungs:  Respirations even and unlabored.  Clear throughout to auscultation.   No wheezes, crackles, or rhonchi. No acute distress. Heart:  Regular rate and rhythm; no murmurs, clicks, rubs, or gallops. Abdomen:  Normal bowel sounds.  No bruits.  Soft, and mildly distended without masses, hepatosplenomegaly or hernias noted.  Mild tenderness over the left upper mid and lower abdomen.  No right-sided abdominal tenderness..  No guarding or rebound tenderness.    Neurologic: Wheelchair dependent, not alert to time person or place, bilateral arm contractures, thin extremities. Psych:  Alert and cooperative. Normal mood and affect.  Imaging Studies: CT ABDOMEN PELVIS W CONTRAST  Result Date: 03/04/2023 CLINICAL DATA:  Acute nonlocalized abdominal pain. Yellowish/white thick discharge from penis. Tachycardic. Constipated. Recently diagnosed with UTI. EXAM: CT ABDOMEN AND PELVIS WITH CONTRAST TECHNIQUE: Multidetector CT imaging of the abdomen and pelvis was performed using the standard protocol following bolus administration of intravenous contrast. RADIATION DOSE REDUCTION: This exam was performed according to the departmental dose-optimization program which includes automated exposure control, adjustment of the mA and/or kV according to patient size and/or use of iterative reconstruction technique. CONTRAST:  OMNIPAQUE IOHEXOL 300 MG/ML  SOLN COMPARISON:  CT abdomen and pelvis 02/26/2023 FINDINGS: Lower chest: No acute abnormality.  Bibasilar atelectasis. Hepatobiliary: No focal liver abnormality is seen. Status post cholecystectomy. Prominent common bile duct compatible with reservoir effect from cholecystectomy. Pancreas: Unremarkable. Spleen: Unremarkable. Adrenals/Urinary Tract: Stable adrenal glands. Low-attenuation lesions in the kidneys are statistically likely to represent cysts. No follow-up is required. No urinary calculi or hydronephrosis. Foley catheter in the decompressed  thick-walled bladder. Stomach/Bowel: Normal caliber large and small bowel. Large colonic stool load. Normal appendix. No bowel wall thickening. Stomach is within normal limits. Vascular/Lymphatic: No significant vascular findings are present. No enlarged abdominal or pelvic lymph nodes. Reproductive: Enlarged prostate. Other: No free intraperitoneal fluid or air. Optimally visualized VP shunt. Musculoskeletal: No acute fracture. Right convex thoracolumbar curve. Right hip dysplasia. IMPRESSION: 1. Foley catheter in the decompressed thick-walled bladder. Recommend correlation with urinalysis to exclude cystitis. 2. Large colonic stool load.  No evidence of obstruction. Electronically Signed   By: Minerva Fester M.D.   On: 03/04/2023 19:30   DG Chest Port 1 View  Result Date: 03/04/2023 CLINICAL DATA:  Constipation.  Tachycardia.  UTI EXAM: PORTABLE CHEST 1 VIEW COMPARISON:  Chest CT 05/23/2022 FINDINGS: Film is rotated to the left and underinflated. Particular volume loss of the left hemithorax with some basilar atelectasis or scar. No consolidation, pneumothorax or edema. Normal cardiopericardial silhouette. Overlapping cardiac leads. Osteopenia. VP shunt catheter tubing running vertically along the left hemithorax. There are some calcifications along portions of the catheters. Previous rib fractures are not as well seen on this x-ray. IMPRESSION: Rotated radiograph with volume loss and left basilar atelectasis or scar. VP shunt catheters along the left hemithorax. Please correlate with the history. There is some calcifications along the course of the catheters. Electronically Signed   By: Karen Kays M.D.   On: 03/04/2023 19:02    Assessment and Plan:   Emre Hubby is a 48 y.o. y/o male has been referred for chronic abdominal pain, chronic constipation, history of H. pylori, and colon cancer screening.  He has never had an EGD or colonoscopy.  3 negative CT scans in the past 1.5 years.  Previously  treated for H. pylori.  Constipation is under pretty good control on current treatment.    Medical history significant for cerebral palsy, wheelchair dependent, seizures, VP shunt, chronic idiopathic constipation, CCY and Appy.  History of UTIs, urinary tract obstruction, and Foley catheter placement.  1.  Chronic abdominal pain  2 CT scans done last year were unrevealing.  Most likely due to chronic constipation.  I am recommending an EGD and colonoscopy for further evaluation.  2.  Chronic constipation  Continue current treatment:  Colace stool softener 100 mg twice daily  MiraLAX 17 g 1 capful once daily  Senokot 8.6 mg twice daily  3.  History of H. Pylori -treated in 2023 with amoxicillin, clarithromycin, and PPI.  I discussed risk and benefits of scheduling EGD procedure.  Also discussed checking  H. pylori stool test, which was declined.   Patient and his family agreed to schedule an EGD.   I discussed risks of EGD with patient to include risk of bleeding, perforation, and risk of sedation.   Patient expressed understanding and agrees to proceed with EGD.   4.  Colon cancer screening  Colon cancer screening guidelines discussed.  I discussed risk and benefits of Cologuard versus traditional Colonoscopy.  Patient and his family have decided to proceed with Colonoscopy.   Scheduling Colonoscopy I discussed risks of colonoscopy with patient to include risk of bleeding, colon perforation, and risk of sedation.  Patient expressed understanding and agrees to proceed with colonoscopy.   **Patient's family requested Dr. Servando Snare to do the procedures.  Follow up as needed based on EGD / Colon results.  Bradley Amy, PA-C  *80 minutes was spent on this patient's care today, reviewing chart, giving patient education, making medical decisions, scheduling procedures, and completing chart note.  Greater than 50% of the visit was spent giving patient education to patient and his  family.

## 2023-03-28 NOTE — Progress Notes (Signed)
Catheter Removal  Patient is present today for a catheter removal. 9ml of water was drained from the balloon. A 16FR Coude foley cath was removed from the bladder, no complications were noted. Patient tolerated well.  Performed by: Debbe Bales, CMA (AAMA)   Follow up/ Additional notes: RTC as scheduled this afternoon for bladder scan.

## 2023-03-29 ENCOUNTER — Ambulatory Visit (INDEPENDENT_AMBULATORY_CARE_PROVIDER_SITE_OTHER): Payer: Medicare Other | Admitting: Physician Assistant

## 2023-03-29 ENCOUNTER — Encounter: Payer: Self-pay | Admitting: Physician Assistant

## 2023-03-29 VITALS — BP 104/72 | HR 74 | Temp 98.2°F

## 2023-03-29 DIAGNOSIS — R109 Unspecified abdominal pain: Secondary | ICD-10-CM | POA: Diagnosis not present

## 2023-03-29 DIAGNOSIS — K5909 Other constipation: Secondary | ICD-10-CM | POA: Diagnosis not present

## 2023-03-29 DIAGNOSIS — Z8619 Personal history of other infectious and parasitic diseases: Secondary | ICD-10-CM | POA: Diagnosis not present

## 2023-03-29 DIAGNOSIS — Z1211 Encounter for screening for malignant neoplasm of colon: Secondary | ICD-10-CM

## 2023-03-29 DIAGNOSIS — R1084 Generalized abdominal pain: Secondary | ICD-10-CM

## 2023-03-29 DIAGNOSIS — K5904 Chronic idiopathic constipation: Secondary | ICD-10-CM

## 2023-03-29 MED ORDER — PEG 3350-KCL-NA BICARB-NACL 420 G PO SOLR: 4000.0000 mL | Freq: Once | ORAL | 0 refills | Status: AC

## 2023-03-29 NOTE — Patient Instructions (Signed)
Please see follow up appointment listed below. 

## 2023-04-02 ENCOUNTER — Other Ambulatory Visit: Payer: Self-pay | Admitting: *Deleted

## 2023-04-02 NOTE — Patient Outreach (Signed)
Per [facility] Bradley [name] resides in [facility] skilled nursing facility. Screening for potential [facility] care coordination services as benefit of health plan and Primary Care Provider.   Telephone call made to Bradley [name] (mother) [phone]. Patient identifiers confirmed. Bradley [name] reports they are still working with an Pensions consultant for [name] application. States the facility has suggested adult day program. However, Bradley [name] states she is unable to care for Bradley [name] at night at home. States [facility] does not have any long term care beds available. States her daughter has been working on this but she is currently on vacation. Discussed Clinical research associate will follow up with Bradley [name], [facility] social worker regarding further details. Bradley [name] states Bradley [name] does not have long term Medicaid yet.   Telephone call made to [facility]. No answer. Voicemail message left requesting return call. Also sent secure message to inquire about long term care plans and if she is assisting family with finding another facility for long term care.   Bradley Noble, MSN, RN,BSN [facility] Post Acute Care Coordinator [phone] (Direct dial)

## 2023-04-03 ENCOUNTER — Ambulatory Visit: Payer: Self-pay | Admitting: *Deleted

## 2023-04-03 ENCOUNTER — Telehealth: Payer: Self-pay | Admitting: *Deleted

## 2023-04-03 NOTE — Patient Outreach (Signed)
  Care Coordination   Follow Up Visit Note   04/03/2023 Name: Bradley [name] MRN: [id number] DOB: 17-Sep-1976  Bradley [name] is a 47 y.o. year old male who sees Danella Penton, MD for primary care. I  spoke   with pt's mom by phone   What matters to the patients health and wellness today?  Pt remains at SNF rehab- unsure of d/c plans or disposition at d/c; family wanting long term placement.    Goals Addressed   None     SDOH assessments and interventions completed:  Yes     Care Coordination Interventions:  Yes, provided  Interventions Today    Flowsheet Row Most Recent Value  Chronic Disease   Chronic disease during today's visit Other  [IDD/cerebral palsy]  General Interventions   General Interventions Discussed/Reviewed General Interventions Discussed, Level of Care, Communication with  [CSW spoke with Bradley [name] at SNF who says they are working "together with family to seek long term care bed".]  Level of Care Skilled Nursing Facility  [Pt remains at [facility] rehab and family is hoping for long term placement at NF. CSW stressed to pt's mom the need to communicate with SNF d/c planner and voice this need and plan,  with home not being a doable option. CSW encouraged mom to seek preferences]  Applications Medicaid  [LTC Medicaid will be needed for long term NF placement]  Education Interventions   Education Provided Provided Education  Provided Verbal Education On Other  [talked with mom at length about dc planning and the need to be communicating wtih SNF social worker for plans. Mom asked about getting a list of long term facility options- advised the SNF social worker should be able to provide this to her.]  Applications Medicaid  [facility] Medicaid will be needed for long term NF placement]       Follow up plan: Follow up call scheduled for 04/08/23    Encounter Outcome:  Pt. Visit Completed

## 2023-04-04 DIAGNOSIS — R339 Retention of urine, unspecified: Secondary | ICD-10-CM | POA: Diagnosis not present

## 2023-04-04 DIAGNOSIS — K5900 Constipation, unspecified: Secondary | ICD-10-CM | POA: Diagnosis not present

## 2023-04-04 DIAGNOSIS — E871 Hypo-osmolality and hyponatremia: Secondary | ICD-10-CM | POA: Diagnosis not present

## 2023-04-04 DIAGNOSIS — G809 Cerebral palsy, unspecified: Secondary | ICD-10-CM | POA: Diagnosis not present

## 2023-04-08 ENCOUNTER — Ambulatory Visit: Payer: Self-pay | Admitting: *Deleted

## 2023-04-08 ENCOUNTER — Other Ambulatory Visit: Payer: Self-pay | Admitting: *Deleted

## 2023-04-08 NOTE — Patient Outreach (Signed)
Post-Acute Care Coordinator follow up. Mr. Rensel remains in [facility]. Writer continues to make multiple attempts to collaborate with social work team at [facility] without success.   Telephone call made to Tayshon Barrientes (mother/DPR) [phone]. Jo answers phone and states she can not talk, she will call back.   Telephone call made to [place], [facility] Child psychotherapist. No answer. Voicemail left to request update on transition plans regarding long term care. Secure message sent again to [facility] social work team.   Will continue to follow.   Raiford Noble, MSN, RN,BSN [facility] Post Acute Care Coordinator [phone] (Direct dial)

## 2023-04-09 ENCOUNTER — Ambulatory Visit: Payer: Self-pay | Admitting: *Deleted

## 2023-04-09 DIAGNOSIS — J3089 Other allergic rhinitis: Secondary | ICD-10-CM | POA: Diagnosis not present

## 2023-04-09 NOTE — Patient Outreach (Signed)
CSW spoke with pt's mother by phone on 04/08/23. Pt remains at [facility] for SNF rehab with unknown dc date.  CSW discussed again with pt's mother the importance of communicating with the SNF staff often and asap to reiterate the need for long term placement.  Mother is unsure about action being taken by SNF to seek long term beds elsewhere and is encouraged to ask. CSW discussed her first choice for  longterm care ([facility]). CSW will assist as able-   Reece Levy, MSW, LCSW Clinical Social Worker [facility] [phone]

## 2023-04-11 ENCOUNTER — Telehealth: Payer: Self-pay | Admitting: *Deleted

## 2023-04-11 NOTE — Patient Outreach (Signed)
  Care Coordination   04/11/2023 Name: Jaelon Gatley MRN: [id number] DOB: Jan 10, 1976   Care Coordination Outreach Attempts:  An unsuccessful telephone outreach was attempted today to offer the patient information about available care coordination services.  Follow Up Plan:  Additional outreach attempts will be made to offer the patient care coordination information and services.   Encounter Outcome:  Pt. Visit Completed   Care Coordination Interventions:  No, not indicated    Reece Levy, MSW, LCSW Clinical Social Worker [facility] [phone]

## 2023-04-12 ENCOUNTER — Other Ambulatory Visit: Payer: Self-pay | Admitting: *Deleted

## 2023-04-12 NOTE — Patient Outreach (Signed)
Post- Acute Care Coordinator follow up. Mr. Laclair resides in [facility].   Telephone call made to Miller Grimmett (mother/DPR) [phone]. No answer. HIPAA compliant voicemail message left requesting return call.   Telephone call made to [place], [facility] social worker. No answer. Confidential voicemail message left requesting return call and inquiry about status of transition plans for long term care. Secure message sent to [facility] as well.   Will continue to follow.  Raiford Noble, MSN, RN,BSN [facility] Post Acute Care Coordinator [phone] (Direct dial)

## 2023-04-15 ENCOUNTER — Other Ambulatory Visit: Payer: Self-pay | Admitting: *Deleted

## 2023-04-15 NOTE — Patient Outreach (Addendum)
Post-Acute Care Coordinator follow up.   Telephone call made to Bradley [name] (mom/DPR) [phone]. Patient identifiers confirmed. Bradley [name] reports Bradley [name] has converted to private pay. States they paid 10,000 for 30 days of private pay until they find another facility for long term care. States Bradley [name] stated she has a Horticulturist, commercial of [place] facilities but not [place]. Bradley [name] reports Bradley [name] has not supplied the list yet. States she is going to go the social work office to ask for the list. Bradley [name] states they are still working on [name] for Bradley [name]. States Medicaid is not in place yet.   Bradley [name] states her husband contacted [facility] today. States [facility] is closer to their home. States her husband was told Bradley [name] is below the age for long term care. Bradley [name] states they are trying to find a long term care facility before August 10th.   Discussed Clinical research associate will update [facility] SW.   Secure message sent to [facility] social workers to inquire whether they are assisting with placement elsewhere. Also inquired if they have a list of [facility] facilities to give to Bradley [name].  Spoke with [employer], [place]. Bradley [name] converted to private pay on 04/11/23.   Bradley Noble, MSN, RN,BSN [facility] Post Acute Care Coordinator [phone] (Direct dial)

## 2023-04-16 ENCOUNTER — Telehealth: Payer: Self-pay | Admitting: *Deleted

## 2023-04-16 ENCOUNTER — Encounter: Payer: Self-pay | Admitting: *Deleted

## 2023-04-16 NOTE — Patient Outreach (Signed)
  Care Coordination   04/16/2023 Name: Bradley [name] MRN: [id number] DOB: 04/22/1976   Care Coordination Outreach Attempts:  An unsuccessful telephone outreach was attempted today to offer the patient information about available care coordination services.  Follow Up Plan:  Additional outreach attempts will be made to offer the patient care coordination information and services.   Encounter Outcome:  Pt. Request to Call Back   Care Coordination Interventions:  No, not indicated    Reece Levy, MSW, LCSW Clinical Social Worker [facility] [phone]

## 2023-04-17 ENCOUNTER — Ambulatory Visit: Payer: Self-pay | Admitting: *Deleted

## 2023-04-17 DIAGNOSIS — E871 Hypo-osmolality and hyponatremia: Secondary | ICD-10-CM | POA: Diagnosis not present

## 2023-04-17 DIAGNOSIS — R000 Tachycardia, unspecified: Secondary | ICD-10-CM | POA: Diagnosis not present

## 2023-04-17 DIAGNOSIS — N390 Urinary tract infection, site not specified: Secondary | ICD-10-CM | POA: Diagnosis not present

## 2023-04-18 DIAGNOSIS — R000 Tachycardia, unspecified: Secondary | ICD-10-CM | POA: Diagnosis not present

## 2023-04-22 DIAGNOSIS — M6259 Muscle wasting and atrophy, not elsewhere classified, multiple sites: Secondary | ICD-10-CM | POA: Diagnosis not present

## 2023-04-22 DIAGNOSIS — G800 Spastic quadriplegic cerebral palsy: Secondary | ICD-10-CM | POA: Diagnosis not present

## 2023-04-22 DIAGNOSIS — M2450 Contracture, unspecified joint: Secondary | ICD-10-CM | POA: Diagnosis not present

## 2023-04-23 DIAGNOSIS — M6259 Muscle wasting and atrophy, not elsewhere classified, multiple sites: Secondary | ICD-10-CM | POA: Diagnosis not present

## 2023-04-23 DIAGNOSIS — G800 Spastic quadriplegic cerebral palsy: Secondary | ICD-10-CM | POA: Diagnosis not present

## 2023-04-23 DIAGNOSIS — M2450 Contracture, unspecified joint: Secondary | ICD-10-CM | POA: Diagnosis not present

## 2023-04-24 ENCOUNTER — Other Ambulatory Visit: Payer: Self-pay | Admitting: *Deleted

## 2023-04-24 DIAGNOSIS — M2450 Contracture, unspecified joint: Secondary | ICD-10-CM | POA: Diagnosis not present

## 2023-04-24 DIAGNOSIS — G800 Spastic quadriplegic cerebral palsy: Secondary | ICD-10-CM | POA: Diagnosis not present

## 2023-04-24 DIAGNOSIS — M6259 Muscle wasting and atrophy, not elsewhere classified, multiple sites: Secondary | ICD-10-CM | POA: Diagnosis not present

## 2023-04-24 NOTE — Patient Outreach (Signed)
Post- Acute Care Coordinator follow up. Bradley [name] resides in [facility] skilled nursing facility under private pay. He has been active with [facility] care coordination team prior.   Telephone call made to Bradley [name], [facility] Network engineer. Made Bradley [name] aware of writer's conversation with Bradley [name] on yesterday. Writer confirmed with Bradley [name] that Bradley [name]'s family wanted transfer to another facility that is closer to family. Writer inquired whether Bradley [name] can transfer to another facility with Medicaid pending. Bradley [name] states usually facilities do not accept without a payor source in place. States he would be considered private pay until facility Medicaid kicks in. At which time, family would still likely be required to pay upfront out of pocket costs. Bradley [name] has already stated they cannot continue to pay out of pocket for stay. Family has already paid 10,000 for 30 days to spend down to apply for facility Medicaid.   Bradley [name] states she will follow up and confirm and validate that Medicaid application has been submitted. States she was under the impression family had to spend down first. Bradley [name] states she will speak with Bradley [name] again to discuss plans. Bradley [name] reports they are aware Bradley [name] needs long term care. States she is not pushing Bradley [name] out. States she understands Bradley [name] needs placement. Bradley [name] states she will Chief Financial Officer later. Writer provided contact information.   Updated [facility] LCSW. Will continue to follow.   Bradley Noble, MSN, RN,BSN [facility] Post Acute Care Coordinator [phone] (Direct dial)

## 2023-04-24 NOTE — Patient Outreach (Signed)
Late entry for 04/23/23. Mr. Traynham resides in [facility] skilled nursing facility.   Spoke with Network engineer, [place]. Writer discussed concerns around family not receiving assistance for transferring to another facility. Judeth Cornfield reports Mrs. Boucher's daughter looked at several facilities in [facility] and was not impressed by them. Therefore, they did not pursue transfer. Judeth Cornfield reports she has been in contact with the [place] family regarding placement and long term care needs.   Telephone call made to Sigmund Morera (mother/DPR)  [phone].  Alvino Chapel reports Medicaid application for facility Medicaid has been applied for. States they have spent down to apply. Reports they cannot continue to pay out of pocket privately beyond the 30 days of which they have already paid. Alvino Chapel states they may take Mr. Bertsch home after private pay period is over. Discussed the importance of Mr. Carreira being in long term care. Alvino Chapel agrees but states they cannot afford to pay privately.   Writer to follow back up with Network engineer. Update sent to [facility] LCSW.  Raiford Noble, MSN, RN,BSN [facility] Post Acute Care Coordinator [phone] (Direct dial)

## 2023-04-25 ENCOUNTER — Ambulatory Visit (INDEPENDENT_AMBULATORY_CARE_PROVIDER_SITE_OTHER): Payer: Medicare Other | Admitting: Physician Assistant

## 2023-04-25 ENCOUNTER — Encounter: Payer: Self-pay | Admitting: Physician Assistant

## 2023-04-25 VITALS — BP 115/80 | HR 107 | Ht <= 58 in | Wt 100.0 lb

## 2023-04-25 DIAGNOSIS — R339 Retention of urine, unspecified: Secondary | ICD-10-CM

## 2023-04-25 DIAGNOSIS — M6259 Muscle wasting and atrophy, not elsewhere classified, multiple sites: Secondary | ICD-10-CM | POA: Diagnosis not present

## 2023-04-25 DIAGNOSIS — Z125 Encounter for screening for malignant neoplasm of prostate: Secondary | ICD-10-CM

## 2023-04-25 DIAGNOSIS — G800 Spastic quadriplegic cerebral palsy: Secondary | ICD-10-CM | POA: Diagnosis not present

## 2023-04-25 DIAGNOSIS — M2450 Contracture, unspecified joint: Secondary | ICD-10-CM | POA: Diagnosis not present

## 2023-04-25 LAB — BLADDER SCAN AMB NON-IMAGING: Scan Result: 235 ml

## 2023-04-25 NOTE — Progress Notes (Signed)
04/25/2023 2:28 PM   Tia Alert 06-13-1976 [id number]  CC: Chief Complaint  Patient presents with   Follow-up   Urinary Retention   HPI: Bradley [name] is a 47 y.o. male with PMH CP, seizures, BPH, and chronic idiopathic constipation with a recent history of urinary retention who passed a voiding trial with me last month who presents today for symptom recheck and PVR.  He is accompanied today by his sister and mother, who contribute to HPI.  Today he reports he continues to void but has occasional episodes where he reports urinary urgency or urge incontinence episodes, but is found to have dry Depends.  Other times he reports that he is straining to initiate his stream.  He remains on a bowel regimen, but they note that there is frequently a delay in toileting when he reports the urge to have a BM due to staffing at [facility].  He is wheelchair-bound with contractures of the upper and lower extremities and requires assistance for toileting.  They clarified that he does have a family history of prostate cancer in his paternal grandfather.  Again, most recent PSA was 0.4 last year.  PVR 235 mL.  PMH: Past Medical History:  Diagnosis Date   Allergy    Blood transfusion without reported diagnosis    Cerebral palsy, hemiplegic (HCC)    Chronic idiopathic constipation    Fracture    4 fractured riibs   Neuromuscular disorder (HCC)    Seizure disorder [facility])     Surgical History: Past Surgical History:  Procedure Laterality Date   ADENOIDECTOMY     CHOLECYSTECTOMY     cranial-peritoneal shunt     TONSILLECTOMY      Home Medications:  Allergies as of 04/25/2023       Reactions   Morphine Other (See Comments)   Other reaction(s): Unknown Had a hard time waking up after surgery. Had a hard time waking up after surgery.        Medication List        Accurate as of April 25, 2023  2:28 PM. If you have any questions, ask your nurse or doctor.           acetaminophen 500 MG tablet Commonly known as: TYLENOL Take 500 mg by mouth every 6 (six) hours as needed for headache, fever, moderate pain or mild pain.   baclofen 20 MG tablet Commonly known as: LIORESAL Take 1 tablet (20 mg total) by mouth 4 (four) times daily.   chlorhexidine 0.12 % solution Commonly known as: PERIDEX Use as directed 15 mLs in the mouth or throat 2 (two) times daily. What changed:  when to take this reasons to take this   ciprofloxacin 500 MG tablet Commonly known as: CIPRO Take 500 mg by mouth 2 (two) times daily.   fluconazole 100 MG tablet Commonly known as: Diflucan Take 1 tablet (100 mg total) by mouth daily. What changed: when to take this   nystatin powder Commonly known as: MYCOSTATIN/NYSTOP Apply 1 Application topically daily.   ofloxacin 0.3 % ophthalmic solution Commonly known as: OCUFLOX SMARTSIG:In Eye(s)   polyethylene glycol powder 17 GM/SCOOP powder Commonly known as: GLYCOLAX/MIRALAX Take 34 g by mouth daily.   QUEtiapine 25 MG tablet Commonly known as: SEROQUEL Take 25 mg by mouth at bedtime.   senna 8.6 MG Tabs tablet Commonly known as: SENOKOT Take 1 tablet (8.6 mg total) by mouth daily.   tamsulosin 0.4 MG Caps capsule Commonly known as: FLOMAX Take  1 capsule (0.4 mg total) by mouth daily. What changed: when to take this   Vitamin D (Ergocalciferol) 1.25 MG (50000 UNIT) Caps capsule Commonly known as: DRISDOL Take 1 capsule (50,000 Units total) by mouth every 7 (seven) days.        Allergies:  Allergies  Allergen Reactions   Morphine Other (See Comments)    Other reaction(s): Unknown Had a hard time waking up after surgery. Had a hard time waking up after surgery.     Family History: Family History  Problem Relation Age of Onset   Hyperlipidemia Mother    Hypertension Mother    Ulcerative colitis Mother    Diabetes Mother    Migraines Mother    Osteoarthritis Mother    Diabetes Father     Hypertension Father    Migraines Sister    Diabetes Maternal Uncle    Breast cancer Maternal Grandmother    Heart attack Maternal Grandmother    Diabetes Maternal Grandfather    Heart attack Maternal Great-grandfather    Cancer Other        Prostate and Breast   Colon cancer Neg Hx    Esophageal cancer Neg Hx    Rectal cancer Neg Hx    Stomach cancer Neg Hx     Social History:   reports that he has never smoked. He has never used smokeless tobacco. He reports that he does not drink alcohol and does not use drugs.  Physical Exam: BP 115/80   Pulse (!) 107   Ht 4\' 10"  (1.473 m)   Wt 100 lb (45.4 kg)   BMI 20.90 kg/m   Constitutional:  Alert, no acute distress, nontoxic appearing HEENT: [place], AT Cardiovascular: No clubbing, cyanosis, or edema Respiratory: Normal respiratory effort, no increased work of breathing Skin: No rashes, bruises or suspicious lesions Neurologic: In wheelchair, contractures of the upper and lower extremities Psychiatric: Normal mood and affect  Laboratory Data: Results for orders placed or performed in visit on 04/25/23  BLADDER SCAN AMB NON-IMAGING  Result Value Ref Range   Scan Result 235 ml   Assessment & Plan:   1. Urinary retention PVR slightly increased over prior, however he is not in overt retention.  I do not recommend starting pharmacotherapy for his urinary urgency, as this will likely impair his emptying.  With regard to his straining, I offered them cystoscopy with consideration of outlet procedures, but they prefer to defer this, which is reasonable.  I do not think he would tolerate urodynamics well. - BLADDER SCAN AMB NON-IMAGING  2. Prostate cancer screening information given during patient encounter Very low PSA last year.  Low suspicion for prostate cancer at this time.  Recommend deferring repeat PSA until at least next year.  Return in about 2 months (around 06/26/2023) for Symptom recheck with PVR.  Carman Ching,  PA-C  [facility] [address] [place], [place] [address] [phone]

## 2023-04-26 DIAGNOSIS — M6259 Muscle wasting and atrophy, not elsewhere classified, multiple sites: Secondary | ICD-10-CM | POA: Diagnosis not present

## 2023-04-26 DIAGNOSIS — G800 Spastic quadriplegic cerebral palsy: Secondary | ICD-10-CM | POA: Diagnosis not present

## 2023-04-26 DIAGNOSIS — M2450 Contracture, unspecified joint: Secondary | ICD-10-CM | POA: Diagnosis not present

## 2023-04-29 ENCOUNTER — Ambulatory Visit: Payer: Medicare Other | Admitting: Physician Assistant

## 2023-04-29 DIAGNOSIS — M2450 Contracture, unspecified joint: Secondary | ICD-10-CM | POA: Diagnosis not present

## 2023-04-29 DIAGNOSIS — G800 Spastic quadriplegic cerebral palsy: Secondary | ICD-10-CM | POA: Diagnosis not present

## 2023-04-29 DIAGNOSIS — M6259 Muscle wasting and atrophy, not elsewhere classified, multiple sites: Secondary | ICD-10-CM | POA: Diagnosis not present

## 2023-04-30 DIAGNOSIS — M6259 Muscle wasting and atrophy, not elsewhere classified, multiple sites: Secondary | ICD-10-CM | POA: Diagnosis not present

## 2023-04-30 DIAGNOSIS — M2450 Contracture, unspecified joint: Secondary | ICD-10-CM | POA: Diagnosis not present

## 2023-04-30 DIAGNOSIS — G800 Spastic quadriplegic cerebral palsy: Secondary | ICD-10-CM | POA: Diagnosis not present

## 2023-05-01 ENCOUNTER — Other Ambulatory Visit: Payer: Self-pay | Admitting: *Deleted

## 2023-05-01 DIAGNOSIS — M2450 Contracture, unspecified joint: Secondary | ICD-10-CM | POA: Diagnosis not present

## 2023-05-01 DIAGNOSIS — G800 Spastic quadriplegic cerebral palsy: Secondary | ICD-10-CM | POA: Diagnosis not present

## 2023-05-01 DIAGNOSIS — M6259 Muscle wasting and atrophy, not elsewhere classified, multiple sites: Secondary | ICD-10-CM | POA: Diagnosis not present

## 2023-05-01 NOTE — Patient Outreach (Signed)
Post-Acute Care Coordinator follow up. Mr. Alberts resides in [facility] skilled nursing facility.   Update received from [place], Network engineer stating she has confirmed Medicaid has been applied for. States Mr. Worsham can remain in long term care until family decides otherwise.   Telephone call made to Santford Sobalvarro (mom/DPR) [phone]. Alvino Chapel reports she is still contemplating Mr. Pinks going home once private private pay has concluded. Writer reminded Mrs.Pickert that she has said she cannot take care of Mr. Peto at home. Alvino Chapel states she has not spoken to the Network engineer. Writer encouraged Mrs. Werst to go to [employer] to speak with Judeth Cornfield while she is there at the facility. Mrs. Mckinny states she has a few things currently going on and will speak with Judeth Cornfield, Network engineer later.   Discussed writer will follow up again on Monday after being out of the office on Thursday and Friday.   Will continue to follow.  Raiford Noble, MSN, RN,BSN [facility] Post Acute Care Coordinator [phone] (Direct dial)

## 2023-05-02 DIAGNOSIS — G800 Spastic quadriplegic cerebral palsy: Secondary | ICD-10-CM | POA: Diagnosis not present

## 2023-05-02 DIAGNOSIS — M2450 Contracture, unspecified joint: Secondary | ICD-10-CM | POA: Diagnosis not present

## 2023-05-03 DIAGNOSIS — G800 Spastic quadriplegic cerebral palsy: Secondary | ICD-10-CM | POA: Diagnosis not present

## 2023-05-03 DIAGNOSIS — M2450 Contracture, unspecified joint: Secondary | ICD-10-CM | POA: Diagnosis not present

## 2023-05-06 ENCOUNTER — Other Ambulatory Visit: Payer: Self-pay | Admitting: *Deleted

## 2023-05-06 DIAGNOSIS — M2450 Contracture, unspecified joint: Secondary | ICD-10-CM | POA: Diagnosis not present

## 2023-05-06 DIAGNOSIS — G800 Spastic quadriplegic cerebral palsy: Secondary | ICD-10-CM | POA: Diagnosis not present

## 2023-05-06 NOTE — Patient Outreach (Signed)
Post-Acute Care Coordinator follow up. Mr. Bradley [name] resides in [facility] skilled nursing facility.   Telephone call made to mom/DPR Bradley [name] [phone]). Patient identifiers confirmed. Writer inquired about transition plans. Bradley [name] asks Clinical research associate to call back tomorrow when she should have "good news."  Writer to follow up again with Bradley [name] tomorrow.   Will continue to follow.  Raiford Noble, MSN, RN,BSN [facility] Post Acute Care Coordinator [phone] (Direct dial)

## 2023-05-07 DIAGNOSIS — G800 Spastic quadriplegic cerebral palsy: Secondary | ICD-10-CM | POA: Diagnosis not present

## 2023-05-07 DIAGNOSIS — M2450 Contracture, unspecified joint: Secondary | ICD-10-CM | POA: Diagnosis not present

## 2023-05-08 DIAGNOSIS — E559 Vitamin D deficiency, unspecified: Secondary | ICD-10-CM | POA: Diagnosis not present

## 2023-05-08 DIAGNOSIS — E871 Hypo-osmolality and hyponatremia: Secondary | ICD-10-CM | POA: Diagnosis not present

## 2023-05-08 DIAGNOSIS — N390 Urinary tract infection, site not specified: Secondary | ICD-10-CM | POA: Diagnosis not present

## 2023-05-08 DIAGNOSIS — R339 Retention of urine, unspecified: Secondary | ICD-10-CM | POA: Diagnosis not present

## 2023-05-09 ENCOUNTER — Other Ambulatory Visit: Payer: Self-pay | Admitting: *Deleted

## 2023-05-09 DIAGNOSIS — M24531 Contracture, right wrist: Secondary | ICD-10-CM | POA: Diagnosis not present

## 2023-05-09 DIAGNOSIS — G800 Spastic quadriplegic cerebral palsy: Secondary | ICD-10-CM | POA: Diagnosis not present

## 2023-05-09 DIAGNOSIS — M24532 Contracture, left wrist: Secondary | ICD-10-CM | POA: Diagnosis not present

## 2023-05-09 DIAGNOSIS — M6249 Contracture of muscle, multiple sites: Secondary | ICD-10-CM | POA: Diagnosis not present

## 2023-05-09 DIAGNOSIS — M24521 Contracture, right elbow: Secondary | ICD-10-CM | POA: Diagnosis not present

## 2023-05-09 DIAGNOSIS — K5900 Constipation, unspecified: Secondary | ICD-10-CM | POA: Diagnosis not present

## 2023-05-09 DIAGNOSIS — M24522 Contracture, left elbow: Secondary | ICD-10-CM | POA: Diagnosis not present

## 2023-05-09 DIAGNOSIS — T7840XA Allergy, unspecified, initial encounter: Secondary | ICD-10-CM | POA: Diagnosis not present

## 2023-05-09 DIAGNOSIS — N400 Enlarged prostate without lower urinary tract symptoms: Secondary | ICD-10-CM | POA: Diagnosis not present

## 2023-05-09 DIAGNOSIS — E559 Vitamin D deficiency, unspecified: Secondary | ICD-10-CM | POA: Diagnosis not present

## 2023-05-09 NOTE — Patient Outreach (Signed)
Post-Acute Care Coordinator follow up.   Update from [place], [employer]. Judeth Cornfield reports Bradley [name] transitioned to [facility] on 05/08/23 for long term care.   Telephone call made to Bradley [name] (mom/DPR) [phone]. No answer. HIPAA compliant voicemail message left.   Update sent to care coordination LCSW.   Writer signing off. No further identifiable post acute care coordinator needs.   Raiford Noble, MSN, RN,BSN [facility] Post Acute Care Coordinator [phone] (Direct dial)

## 2023-05-10 DIAGNOSIS — M24521 Contracture, right elbow: Secondary | ICD-10-CM | POA: Diagnosis not present

## 2023-05-10 DIAGNOSIS — M24531 Contracture, right wrist: Secondary | ICD-10-CM | POA: Diagnosis not present

## 2023-05-10 DIAGNOSIS — M24522 Contracture, left elbow: Secondary | ICD-10-CM | POA: Diagnosis not present

## 2023-05-10 DIAGNOSIS — B372 Candidiasis of skin and nail: Secondary | ICD-10-CM | POA: Diagnosis not present

## 2023-05-10 DIAGNOSIS — N400 Enlarged prostate without lower urinary tract symptoms: Secondary | ICD-10-CM | POA: Diagnosis not present

## 2023-05-10 DIAGNOSIS — T7840XA Allergy, unspecified, initial encounter: Secondary | ICD-10-CM | POA: Diagnosis not present

## 2023-05-10 DIAGNOSIS — G800 Spastic quadriplegic cerebral palsy: Secondary | ICD-10-CM | POA: Diagnosis not present

## 2023-05-10 DIAGNOSIS — E559 Vitamin D deficiency, unspecified: Secondary | ICD-10-CM | POA: Diagnosis not present

## 2023-05-10 DIAGNOSIS — M6249 Contracture of muscle, multiple sites: Secondary | ICD-10-CM | POA: Diagnosis not present

## 2023-05-10 DIAGNOSIS — K5900 Constipation, unspecified: Secondary | ICD-10-CM | POA: Diagnosis not present

## 2023-05-10 DIAGNOSIS — M24532 Contracture, left wrist: Secondary | ICD-10-CM | POA: Diagnosis not present

## 2023-05-13 DIAGNOSIS — M6249 Contracture of muscle, multiple sites: Secondary | ICD-10-CM | POA: Diagnosis not present

## 2023-05-13 DIAGNOSIS — M24522 Contracture, left elbow: Secondary | ICD-10-CM | POA: Diagnosis not present

## 2023-05-13 DIAGNOSIS — M24532 Contracture, left wrist: Secondary | ICD-10-CM | POA: Diagnosis not present

## 2023-05-13 DIAGNOSIS — G800 Spastic quadriplegic cerebral palsy: Secondary | ICD-10-CM | POA: Diagnosis not present

## 2023-05-13 DIAGNOSIS — M24531 Contracture, right wrist: Secondary | ICD-10-CM | POA: Diagnosis not present

## 2023-05-13 DIAGNOSIS — M24521 Contracture, right elbow: Secondary | ICD-10-CM | POA: Diagnosis not present

## 2023-05-14 DIAGNOSIS — M24531 Contracture, right wrist: Secondary | ICD-10-CM | POA: Diagnosis not present

## 2023-05-14 DIAGNOSIS — M6249 Contracture of muscle, multiple sites: Secondary | ICD-10-CM | POA: Diagnosis not present

## 2023-05-14 DIAGNOSIS — M24522 Contracture, left elbow: Secondary | ICD-10-CM | POA: Diagnosis not present

## 2023-05-14 DIAGNOSIS — M24532 Contracture, left wrist: Secondary | ICD-10-CM | POA: Diagnosis not present

## 2023-05-14 DIAGNOSIS — G800 Spastic quadriplegic cerebral palsy: Secondary | ICD-10-CM | POA: Diagnosis not present

## 2023-05-14 DIAGNOSIS — M24521 Contracture, right elbow: Secondary | ICD-10-CM | POA: Diagnosis not present

## 2023-05-15 ENCOUNTER — Telehealth: Payer: Self-pay | Admitting: Gastroenterology

## 2023-05-15 ENCOUNTER — Telehealth: Payer: Self-pay

## 2023-05-15 DIAGNOSIS — M6249 Contracture of muscle, multiple sites: Secondary | ICD-10-CM | POA: Diagnosis not present

## 2023-05-15 DIAGNOSIS — M24531 Contracture, right wrist: Secondary | ICD-10-CM | POA: Diagnosis not present

## 2023-05-15 DIAGNOSIS — F5101 Primary insomnia: Secondary | ICD-10-CM | POA: Diagnosis not present

## 2023-05-15 DIAGNOSIS — F32A Depression, unspecified: Secondary | ICD-10-CM | POA: Diagnosis not present

## 2023-05-15 DIAGNOSIS — M24521 Contracture, right elbow: Secondary | ICD-10-CM | POA: Diagnosis not present

## 2023-05-15 DIAGNOSIS — M24522 Contracture, left elbow: Secondary | ICD-10-CM | POA: Diagnosis not present

## 2023-05-15 DIAGNOSIS — G800 Spastic quadriplegic cerebral palsy: Secondary | ICD-10-CM | POA: Diagnosis not present

## 2023-05-15 DIAGNOSIS — T7840XA Allergy, unspecified, initial encounter: Secondary | ICD-10-CM | POA: Diagnosis not present

## 2023-05-15 DIAGNOSIS — M24532 Contracture, left wrist: Secondary | ICD-10-CM | POA: Diagnosis not present

## 2023-05-15 DIAGNOSIS — N400 Enlarged prostate without lower urinary tract symptoms: Secondary | ICD-10-CM | POA: Diagnosis not present

## 2023-05-15 DIAGNOSIS — J029 Acute pharyngitis, unspecified: Secondary | ICD-10-CM | POA: Diagnosis not present

## 2023-05-15 NOTE — Telephone Encounter (Signed)
The patient brother Gerri Spore) called in wanting to know why we called I informed him that his sister called wanting to talk to the nurse because she wanted to know if she needs to reschedule or cancel. He is very upset that we spoke to the sister.

## 2023-05-15 NOTE — Telephone Encounter (Signed)
Spoke with sister Rivka Spring states her brother is having frequent bowel movements daily-Colonoscopy is scheduled with Dr.Wohl on 05/21/23. His symptoms have resolved. His constipation is much better with taking Senokot and Miralax and he is not complaining of any pain.  He has just moved to a new facilty- She is cancelling the Colonoscopy due to him just being moved and wanted to know if it needed to be rescheduled. Please advise if Colonoscopy needs to be rescheduled.

## 2023-05-15 NOTE — Telephone Encounter (Addendum)
Patient sister Georgiann Hahn) called in about her brother colonoscopy. She stated she is not sure if they need to reschedule or cancel the procedure her brother is having some symptoms she wants to discuss with the nurse. Called the patient sister back to let her know we received her voicemail and to inform her that the message has been sent.

## 2023-05-15 NOTE — Telephone Encounter (Signed)
Spoke with [facility]- moved EGD/Colonoscopy to 07/25/23 Dr.Wohl. Mailed updated instructions to home address for mother and a copy to [employer] are on [place].  Spoke with Nedra Hai (sister and let her know Inetta Fermo is recommending moving forward with procedures.   I recommend continue with plan for EGD and colonoscopy as scheduled.  We cannot talk to the sister unless she is on patient's DPR.  Even if his constipation is better he still needs to continue with EGD and colonoscopy as scheduled.  Celso Amy, PA-C

## 2023-05-16 DIAGNOSIS — G800 Spastic quadriplegic cerebral palsy: Secondary | ICD-10-CM | POA: Diagnosis not present

## 2023-05-16 DIAGNOSIS — M24522 Contracture, left elbow: Secondary | ICD-10-CM | POA: Diagnosis not present

## 2023-05-16 DIAGNOSIS — M24521 Contracture, right elbow: Secondary | ICD-10-CM | POA: Diagnosis not present

## 2023-05-16 DIAGNOSIS — M24532 Contracture, left wrist: Secondary | ICD-10-CM | POA: Diagnosis not present

## 2023-05-16 DIAGNOSIS — M6249 Contracture of muscle, multiple sites: Secondary | ICD-10-CM | POA: Diagnosis not present

## 2023-05-16 DIAGNOSIS — M24531 Contracture, right wrist: Secondary | ICD-10-CM | POA: Diagnosis not present

## 2023-05-17 DIAGNOSIS — G800 Spastic quadriplegic cerebral palsy: Secondary | ICD-10-CM | POA: Diagnosis not present

## 2023-05-17 DIAGNOSIS — M24532 Contracture, left wrist: Secondary | ICD-10-CM | POA: Diagnosis not present

## 2023-05-17 DIAGNOSIS — M6249 Contracture of muscle, multiple sites: Secondary | ICD-10-CM | POA: Diagnosis not present

## 2023-05-17 DIAGNOSIS — M24522 Contracture, left elbow: Secondary | ICD-10-CM | POA: Diagnosis not present

## 2023-05-17 DIAGNOSIS — M24521 Contracture, right elbow: Secondary | ICD-10-CM | POA: Diagnosis not present

## 2023-05-17 DIAGNOSIS — M24531 Contracture, right wrist: Secondary | ICD-10-CM | POA: Diagnosis not present

## 2023-05-20 DIAGNOSIS — M24521 Contracture, right elbow: Secondary | ICD-10-CM | POA: Diagnosis not present

## 2023-05-20 DIAGNOSIS — M6249 Contracture of muscle, multiple sites: Secondary | ICD-10-CM | POA: Diagnosis not present

## 2023-05-20 DIAGNOSIS — G800 Spastic quadriplegic cerebral palsy: Secondary | ICD-10-CM | POA: Diagnosis not present

## 2023-05-20 DIAGNOSIS — H109 Unspecified conjunctivitis: Secondary | ICD-10-CM | POA: Diagnosis not present

## 2023-05-20 DIAGNOSIS — M24531 Contracture, right wrist: Secondary | ICD-10-CM | POA: Diagnosis not present

## 2023-05-20 DIAGNOSIS — M24532 Contracture, left wrist: Secondary | ICD-10-CM | POA: Diagnosis not present

## 2023-05-20 DIAGNOSIS — M24522 Contracture, left elbow: Secondary | ICD-10-CM | POA: Diagnosis not present

## 2023-05-21 DIAGNOSIS — M24521 Contracture, right elbow: Secondary | ICD-10-CM | POA: Diagnosis not present

## 2023-05-21 DIAGNOSIS — G800 Spastic quadriplegic cerebral palsy: Secondary | ICD-10-CM | POA: Diagnosis not present

## 2023-05-21 DIAGNOSIS — M24532 Contracture, left wrist: Secondary | ICD-10-CM | POA: Diagnosis not present

## 2023-05-21 DIAGNOSIS — M24522 Contracture, left elbow: Secondary | ICD-10-CM | POA: Diagnosis not present

## 2023-05-21 DIAGNOSIS — M24531 Contracture, right wrist: Secondary | ICD-10-CM | POA: Diagnosis not present

## 2023-05-21 DIAGNOSIS — M6249 Contracture of muscle, multiple sites: Secondary | ICD-10-CM | POA: Diagnosis not present

## 2023-05-22 DIAGNOSIS — M24521 Contracture, right elbow: Secondary | ICD-10-CM | POA: Diagnosis not present

## 2023-05-22 DIAGNOSIS — G800 Spastic quadriplegic cerebral palsy: Secondary | ICD-10-CM | POA: Diagnosis not present

## 2023-05-22 DIAGNOSIS — M24531 Contracture, right wrist: Secondary | ICD-10-CM | POA: Diagnosis not present

## 2023-05-22 DIAGNOSIS — M24532 Contracture, left wrist: Secondary | ICD-10-CM | POA: Diagnosis not present

## 2023-05-22 DIAGNOSIS — M24522 Contracture, left elbow: Secondary | ICD-10-CM | POA: Diagnosis not present

## 2023-05-22 DIAGNOSIS — M6249 Contracture of muscle, multiple sites: Secondary | ICD-10-CM | POA: Diagnosis not present

## 2023-05-24 DIAGNOSIS — H109 Unspecified conjunctivitis: Secondary | ICD-10-CM | POA: Diagnosis not present

## 2023-05-24 DIAGNOSIS — K5900 Constipation, unspecified: Secondary | ICD-10-CM | POA: Diagnosis not present

## 2023-05-24 DIAGNOSIS — R109 Unspecified abdominal pain: Secondary | ICD-10-CM | POA: Diagnosis not present

## 2023-05-24 DIAGNOSIS — G800 Spastic quadriplegic cerebral palsy: Secondary | ICD-10-CM | POA: Diagnosis not present

## 2023-05-28 DIAGNOSIS — M24521 Contracture, right elbow: Secondary | ICD-10-CM | POA: Diagnosis not present

## 2023-05-28 DIAGNOSIS — K5900 Constipation, unspecified: Secondary | ICD-10-CM | POA: Diagnosis not present

## 2023-05-28 DIAGNOSIS — R519 Headache, unspecified: Secondary | ICD-10-CM | POA: Diagnosis not present

## 2023-05-28 DIAGNOSIS — M6249 Contracture of muscle, multiple sites: Secondary | ICD-10-CM | POA: Diagnosis not present

## 2023-05-28 DIAGNOSIS — M24531 Contracture, right wrist: Secondary | ICD-10-CM | POA: Diagnosis not present

## 2023-05-28 DIAGNOSIS — M24522 Contracture, left elbow: Secondary | ICD-10-CM | POA: Diagnosis not present

## 2023-05-28 DIAGNOSIS — E559 Vitamin D deficiency, unspecified: Secondary | ICD-10-CM | POA: Diagnosis not present

## 2023-05-28 DIAGNOSIS — M24532 Contracture, left wrist: Secondary | ICD-10-CM | POA: Diagnosis not present

## 2023-05-28 DIAGNOSIS — G800 Spastic quadriplegic cerebral palsy: Secondary | ICD-10-CM | POA: Diagnosis not present

## 2023-05-29 DIAGNOSIS — M6249 Contracture of muscle, multiple sites: Secondary | ICD-10-CM | POA: Diagnosis not present

## 2023-05-29 DIAGNOSIS — G800 Spastic quadriplegic cerebral palsy: Secondary | ICD-10-CM | POA: Diagnosis not present

## 2023-05-29 DIAGNOSIS — M24532 Contracture, left wrist: Secondary | ICD-10-CM | POA: Diagnosis not present

## 2023-05-29 DIAGNOSIS — M24522 Contracture, left elbow: Secondary | ICD-10-CM | POA: Diagnosis not present

## 2023-05-29 DIAGNOSIS — M24531 Contracture, right wrist: Secondary | ICD-10-CM | POA: Diagnosis not present

## 2023-05-29 DIAGNOSIS — M24521 Contracture, right elbow: Secondary | ICD-10-CM | POA: Diagnosis not present

## 2023-05-31 DIAGNOSIS — M24532 Contracture, left wrist: Secondary | ICD-10-CM | POA: Diagnosis not present

## 2023-05-31 DIAGNOSIS — M24522 Contracture, left elbow: Secondary | ICD-10-CM | POA: Diagnosis not present

## 2023-05-31 DIAGNOSIS — M6249 Contracture of muscle, multiple sites: Secondary | ICD-10-CM | POA: Diagnosis not present

## 2023-05-31 DIAGNOSIS — G800 Spastic quadriplegic cerebral palsy: Secondary | ICD-10-CM | POA: Diagnosis not present

## 2023-05-31 DIAGNOSIS — M24521 Contracture, right elbow: Secondary | ICD-10-CM | POA: Diagnosis not present

## 2023-05-31 DIAGNOSIS — M24531 Contracture, right wrist: Secondary | ICD-10-CM | POA: Diagnosis not present

## 2023-06-01 DIAGNOSIS — M6249 Contracture of muscle, multiple sites: Secondary | ICD-10-CM | POA: Diagnosis not present

## 2023-06-01 DIAGNOSIS — M24522 Contracture, left elbow: Secondary | ICD-10-CM | POA: Diagnosis not present

## 2023-06-01 DIAGNOSIS — M24532 Contracture, left wrist: Secondary | ICD-10-CM | POA: Diagnosis not present

## 2023-06-01 DIAGNOSIS — G800 Spastic quadriplegic cerebral palsy: Secondary | ICD-10-CM | POA: Diagnosis not present

## 2023-06-01 DIAGNOSIS — M24521 Contracture, right elbow: Secondary | ICD-10-CM | POA: Diagnosis not present

## 2023-06-01 DIAGNOSIS — M24531 Contracture, right wrist: Secondary | ICD-10-CM | POA: Diagnosis not present

## 2023-06-03 DIAGNOSIS — G800 Spastic quadriplegic cerebral palsy: Secondary | ICD-10-CM | POA: Diagnosis not present

## 2023-06-03 DIAGNOSIS — M24522 Contracture, left elbow: Secondary | ICD-10-CM | POA: Diagnosis not present

## 2023-06-03 DIAGNOSIS — M24521 Contracture, right elbow: Secondary | ICD-10-CM | POA: Diagnosis not present

## 2023-06-03 DIAGNOSIS — M24531 Contracture, right wrist: Secondary | ICD-10-CM | POA: Diagnosis not present

## 2023-06-03 DIAGNOSIS — M6249 Contracture of muscle, multiple sites: Secondary | ICD-10-CM | POA: Diagnosis not present

## 2023-06-03 DIAGNOSIS — M24532 Contracture, left wrist: Secondary | ICD-10-CM | POA: Diagnosis not present

## 2023-06-04 DIAGNOSIS — M24532 Contracture, left wrist: Secondary | ICD-10-CM | POA: Diagnosis not present

## 2023-06-04 DIAGNOSIS — M24531 Contracture, right wrist: Secondary | ICD-10-CM | POA: Diagnosis not present

## 2023-06-04 DIAGNOSIS — M24521 Contracture, right elbow: Secondary | ICD-10-CM | POA: Diagnosis not present

## 2023-06-04 DIAGNOSIS — M6249 Contracture of muscle, multiple sites: Secondary | ICD-10-CM | POA: Diagnosis not present

## 2023-06-04 DIAGNOSIS — G800 Spastic quadriplegic cerebral palsy: Secondary | ICD-10-CM | POA: Diagnosis not present

## 2023-06-04 DIAGNOSIS — M24522 Contracture, left elbow: Secondary | ICD-10-CM | POA: Diagnosis not present

## 2023-06-07 DIAGNOSIS — M24531 Contracture, right wrist: Secondary | ICD-10-CM | POA: Diagnosis not present

## 2023-06-07 DIAGNOSIS — G800 Spastic quadriplegic cerebral palsy: Secondary | ICD-10-CM | POA: Diagnosis not present

## 2023-06-07 DIAGNOSIS — M24522 Contracture, left elbow: Secondary | ICD-10-CM | POA: Diagnosis not present

## 2023-06-07 DIAGNOSIS — M24521 Contracture, right elbow: Secondary | ICD-10-CM | POA: Diagnosis not present

## 2023-06-07 DIAGNOSIS — M24532 Contracture, left wrist: Secondary | ICD-10-CM | POA: Diagnosis not present

## 2023-06-07 DIAGNOSIS — M6249 Contracture of muscle, multiple sites: Secondary | ICD-10-CM | POA: Diagnosis not present

## 2023-06-11 DIAGNOSIS — M24531 Contracture, right wrist: Secondary | ICD-10-CM | POA: Diagnosis not present

## 2023-06-11 DIAGNOSIS — M24532 Contracture, left wrist: Secondary | ICD-10-CM | POA: Diagnosis not present

## 2023-06-11 DIAGNOSIS — M6249 Contracture of muscle, multiple sites: Secondary | ICD-10-CM | POA: Diagnosis not present

## 2023-06-11 DIAGNOSIS — M24521 Contracture, right elbow: Secondary | ICD-10-CM | POA: Diagnosis not present

## 2023-06-11 DIAGNOSIS — G800 Spastic quadriplegic cerebral palsy: Secondary | ICD-10-CM | POA: Diagnosis not present

## 2023-06-11 DIAGNOSIS — M24522 Contracture, left elbow: Secondary | ICD-10-CM | POA: Diagnosis not present

## 2023-06-12 DIAGNOSIS — M24521 Contracture, right elbow: Secondary | ICD-10-CM | POA: Diagnosis not present

## 2023-06-12 DIAGNOSIS — G800 Spastic quadriplegic cerebral palsy: Secondary | ICD-10-CM | POA: Diagnosis not present

## 2023-06-12 DIAGNOSIS — M24531 Contracture, right wrist: Secondary | ICD-10-CM | POA: Diagnosis not present

## 2023-06-12 DIAGNOSIS — K5900 Constipation, unspecified: Secondary | ICD-10-CM | POA: Diagnosis not present

## 2023-06-12 DIAGNOSIS — N400 Enlarged prostate without lower urinary tract symptoms: Secondary | ICD-10-CM | POA: Diagnosis not present

## 2023-06-12 DIAGNOSIS — M24522 Contracture, left elbow: Secondary | ICD-10-CM | POA: Diagnosis not present

## 2023-06-12 DIAGNOSIS — M24532 Contracture, left wrist: Secondary | ICD-10-CM | POA: Diagnosis not present

## 2023-06-12 DIAGNOSIS — M6249 Contracture of muscle, multiple sites: Secondary | ICD-10-CM | POA: Diagnosis not present

## 2023-06-12 DIAGNOSIS — E559 Vitamin D deficiency, unspecified: Secondary | ICD-10-CM | POA: Diagnosis not present

## 2023-06-17 DIAGNOSIS — G800 Spastic quadriplegic cerebral palsy: Secondary | ICD-10-CM | POA: Diagnosis not present

## 2023-06-17 DIAGNOSIS — M24531 Contracture, right wrist: Secondary | ICD-10-CM | POA: Diagnosis not present

## 2023-06-17 DIAGNOSIS — M6249 Contracture of muscle, multiple sites: Secondary | ICD-10-CM | POA: Diagnosis not present

## 2023-06-17 DIAGNOSIS — M24532 Contracture, left wrist: Secondary | ICD-10-CM | POA: Diagnosis not present

## 2023-06-17 DIAGNOSIS — M24522 Contracture, left elbow: Secondary | ICD-10-CM | POA: Diagnosis not present

## 2023-06-17 DIAGNOSIS — M24521 Contracture, right elbow: Secondary | ICD-10-CM | POA: Diagnosis not present

## 2023-06-18 DIAGNOSIS — M24531 Contracture, right wrist: Secondary | ICD-10-CM | POA: Diagnosis not present

## 2023-06-18 DIAGNOSIS — M24532 Contracture, left wrist: Secondary | ICD-10-CM | POA: Diagnosis not present

## 2023-06-18 DIAGNOSIS — M24522 Contracture, left elbow: Secondary | ICD-10-CM | POA: Diagnosis not present

## 2023-06-18 DIAGNOSIS — G800 Spastic quadriplegic cerebral palsy: Secondary | ICD-10-CM | POA: Diagnosis not present

## 2023-06-18 DIAGNOSIS — M24521 Contracture, right elbow: Secondary | ICD-10-CM | POA: Diagnosis not present

## 2023-06-18 DIAGNOSIS — M6249 Contracture of muscle, multiple sites: Secondary | ICD-10-CM | POA: Diagnosis not present

## 2023-06-19 ENCOUNTER — Telehealth: Payer: Self-pay

## 2023-06-19 DIAGNOSIS — M24532 Contracture, left wrist: Secondary | ICD-10-CM | POA: Diagnosis not present

## 2023-06-19 DIAGNOSIS — G800 Spastic quadriplegic cerebral palsy: Secondary | ICD-10-CM | POA: Diagnosis not present

## 2023-06-19 DIAGNOSIS — M24531 Contracture, right wrist: Secondary | ICD-10-CM | POA: Diagnosis not present

## 2023-06-19 DIAGNOSIS — M24522 Contracture, left elbow: Secondary | ICD-10-CM | POA: Diagnosis not present

## 2023-06-19 DIAGNOSIS — M6249 Contracture of muscle, multiple sites: Secondary | ICD-10-CM | POA: Diagnosis not present

## 2023-06-19 DIAGNOSIS — M24521 Contracture, right elbow: Secondary | ICD-10-CM | POA: Diagnosis not present

## 2023-06-19 NOTE — Telephone Encounter (Signed)
Per pt mom wats to cancel appt for 06-24-2023. Will procedued with 07-25-2023 procedure. This appt per pt family was made in June but this appt is no longer needed. Will be having procedure in Oct.

## 2023-06-20 DIAGNOSIS — M24521 Contracture, right elbow: Secondary | ICD-10-CM | POA: Diagnosis not present

## 2023-06-20 DIAGNOSIS — M24532 Contracture, left wrist: Secondary | ICD-10-CM | POA: Diagnosis not present

## 2023-06-20 DIAGNOSIS — M6249 Contracture of muscle, multiple sites: Secondary | ICD-10-CM | POA: Diagnosis not present

## 2023-06-20 DIAGNOSIS — M24522 Contracture, left elbow: Secondary | ICD-10-CM | POA: Diagnosis not present

## 2023-06-20 DIAGNOSIS — G800 Spastic quadriplegic cerebral palsy: Secondary | ICD-10-CM | POA: Diagnosis not present

## 2023-06-20 DIAGNOSIS — M24531 Contracture, right wrist: Secondary | ICD-10-CM | POA: Diagnosis not present

## 2023-06-21 DIAGNOSIS — M24522 Contracture, left elbow: Secondary | ICD-10-CM | POA: Diagnosis not present

## 2023-06-21 DIAGNOSIS — M24532 Contracture, left wrist: Secondary | ICD-10-CM | POA: Diagnosis not present

## 2023-06-21 DIAGNOSIS — M24521 Contracture, right elbow: Secondary | ICD-10-CM | POA: Diagnosis not present

## 2023-06-21 DIAGNOSIS — M24531 Contracture, right wrist: Secondary | ICD-10-CM | POA: Diagnosis not present

## 2023-06-21 DIAGNOSIS — M6249 Contracture of muscle, multiple sites: Secondary | ICD-10-CM | POA: Diagnosis not present

## 2023-06-21 DIAGNOSIS — G800 Spastic quadriplegic cerebral palsy: Secondary | ICD-10-CM | POA: Diagnosis not present

## 2023-06-24 ENCOUNTER — Ambulatory Visit: Payer: Medicare Other | Admitting: Gastroenterology

## 2023-06-25 DIAGNOSIS — G800 Spastic quadriplegic cerebral palsy: Secondary | ICD-10-CM | POA: Diagnosis not present

## 2023-06-25 DIAGNOSIS — M24531 Contracture, right wrist: Secondary | ICD-10-CM | POA: Diagnosis not present

## 2023-06-25 DIAGNOSIS — M24522 Contracture, left elbow: Secondary | ICD-10-CM | POA: Diagnosis not present

## 2023-06-25 DIAGNOSIS — M24521 Contracture, right elbow: Secondary | ICD-10-CM | POA: Diagnosis not present

## 2023-06-25 DIAGNOSIS — M6249 Contracture of muscle, multiple sites: Secondary | ICD-10-CM | POA: Diagnosis not present

## 2023-06-25 DIAGNOSIS — M24532 Contracture, left wrist: Secondary | ICD-10-CM | POA: Diagnosis not present

## 2023-06-26 DIAGNOSIS — M6249 Contracture of muscle, multiple sites: Secondary | ICD-10-CM | POA: Diagnosis not present

## 2023-06-26 DIAGNOSIS — M24532 Contracture, left wrist: Secondary | ICD-10-CM | POA: Diagnosis not present

## 2023-06-26 DIAGNOSIS — M24531 Contracture, right wrist: Secondary | ICD-10-CM | POA: Diagnosis not present

## 2023-06-26 DIAGNOSIS — G800 Spastic quadriplegic cerebral palsy: Secondary | ICD-10-CM | POA: Diagnosis not present

## 2023-06-26 DIAGNOSIS — M24521 Contracture, right elbow: Secondary | ICD-10-CM | POA: Diagnosis not present

## 2023-06-26 DIAGNOSIS — M24522 Contracture, left elbow: Secondary | ICD-10-CM | POA: Diagnosis not present

## 2023-06-27 ENCOUNTER — Ambulatory Visit: Payer: Medicare Other | Admitting: Physician Assistant

## 2023-06-28 ENCOUNTER — Telehealth: Payer: Self-pay | Admitting: Gastroenterology

## 2023-06-28 ENCOUNTER — Telehealth: Payer: Self-pay

## 2023-06-28 NOTE — Telephone Encounter (Signed)
Patient mother Alvino Chapel) Called in wanting to cancel his procedure. I informed her I can't talk to her due to she is not on his DPR, guarding ship or the patient verbal consent from the patient.

## 2023-06-28 NOTE — Telephone Encounter (Signed)
Spoke with patients sister and she said to cancel procedures for 10-24.    Patient sister Myrtis Ser) called to cancel his procedure that is schedule for 07/25/2023. She stated that he is doing better and they will call back to reschedule his procedure.

## 2023-06-28 NOTE — Telephone Encounter (Signed)
Patient sister Myrtis Ser) called to cancel his procedure that is schedule for 07/25/2023. She stated that he is doing better and they will call back to reschedule his procedure.

## 2023-07-02 DIAGNOSIS — M24531 Contracture, right wrist: Secondary | ICD-10-CM | POA: Diagnosis not present

## 2023-07-02 DIAGNOSIS — M24521 Contracture, right elbow: Secondary | ICD-10-CM | POA: Diagnosis not present

## 2023-07-02 DIAGNOSIS — G800 Spastic quadriplegic cerebral palsy: Secondary | ICD-10-CM | POA: Diagnosis not present

## 2023-07-02 DIAGNOSIS — M24532 Contracture, left wrist: Secondary | ICD-10-CM | POA: Diagnosis not present

## 2023-07-02 DIAGNOSIS — M6249 Contracture of muscle, multiple sites: Secondary | ICD-10-CM | POA: Diagnosis not present

## 2023-07-02 DIAGNOSIS — M24522 Contracture, left elbow: Secondary | ICD-10-CM | POA: Diagnosis not present

## 2023-07-03 DIAGNOSIS — M6249 Contracture of muscle, multiple sites: Secondary | ICD-10-CM | POA: Diagnosis not present

## 2023-07-03 DIAGNOSIS — M24522 Contracture, left elbow: Secondary | ICD-10-CM | POA: Diagnosis not present

## 2023-07-03 DIAGNOSIS — M24521 Contracture, right elbow: Secondary | ICD-10-CM | POA: Diagnosis not present

## 2023-07-03 DIAGNOSIS — G800 Spastic quadriplegic cerebral palsy: Secondary | ICD-10-CM | POA: Diagnosis not present

## 2023-07-03 DIAGNOSIS — M24531 Contracture, right wrist: Secondary | ICD-10-CM | POA: Diagnosis not present

## 2023-07-03 DIAGNOSIS — M24532 Contracture, left wrist: Secondary | ICD-10-CM | POA: Diagnosis not present

## 2023-07-04 DIAGNOSIS — M24531 Contracture, right wrist: Secondary | ICD-10-CM | POA: Diagnosis not present

## 2023-07-04 DIAGNOSIS — H00012 Hordeolum externum right lower eyelid: Secondary | ICD-10-CM | POA: Diagnosis not present

## 2023-07-04 DIAGNOSIS — G800 Spastic quadriplegic cerebral palsy: Secondary | ICD-10-CM | POA: Diagnosis not present

## 2023-07-04 DIAGNOSIS — M24521 Contracture, right elbow: Secondary | ICD-10-CM | POA: Diagnosis not present

## 2023-07-04 DIAGNOSIS — M24522 Contracture, left elbow: Secondary | ICD-10-CM | POA: Diagnosis not present

## 2023-07-04 DIAGNOSIS — M24532 Contracture, left wrist: Secondary | ICD-10-CM | POA: Diagnosis not present

## 2023-07-04 DIAGNOSIS — M6249 Contracture of muscle, multiple sites: Secondary | ICD-10-CM | POA: Diagnosis not present

## 2023-07-05 DIAGNOSIS — M24532 Contracture, left wrist: Secondary | ICD-10-CM | POA: Diagnosis not present

## 2023-07-05 DIAGNOSIS — M6249 Contracture of muscle, multiple sites: Secondary | ICD-10-CM | POA: Diagnosis not present

## 2023-07-05 DIAGNOSIS — M24522 Contracture, left elbow: Secondary | ICD-10-CM | POA: Diagnosis not present

## 2023-07-05 DIAGNOSIS — M24531 Contracture, right wrist: Secondary | ICD-10-CM | POA: Diagnosis not present

## 2023-07-05 DIAGNOSIS — M24521 Contracture, right elbow: Secondary | ICD-10-CM | POA: Diagnosis not present

## 2023-07-05 DIAGNOSIS — G800 Spastic quadriplegic cerebral palsy: Secondary | ICD-10-CM | POA: Diagnosis not present

## 2023-07-08 DIAGNOSIS — M24522 Contracture, left elbow: Secondary | ICD-10-CM | POA: Diagnosis not present

## 2023-07-08 DIAGNOSIS — M6249 Contracture of muscle, multiple sites: Secondary | ICD-10-CM | POA: Diagnosis not present

## 2023-07-08 DIAGNOSIS — G800 Spastic quadriplegic cerebral palsy: Secondary | ICD-10-CM | POA: Diagnosis not present

## 2023-07-08 DIAGNOSIS — M24521 Contracture, right elbow: Secondary | ICD-10-CM | POA: Diagnosis not present

## 2023-07-08 DIAGNOSIS — M24531 Contracture, right wrist: Secondary | ICD-10-CM | POA: Diagnosis not present

## 2023-07-08 DIAGNOSIS — M24532 Contracture, left wrist: Secondary | ICD-10-CM | POA: Diagnosis not present

## 2023-07-17 DIAGNOSIS — F5101 Primary insomnia: Secondary | ICD-10-CM | POA: Diagnosis not present

## 2023-07-17 DIAGNOSIS — F32A Depression, unspecified: Secondary | ICD-10-CM | POA: Diagnosis not present

## 2023-07-18 DIAGNOSIS — K5900 Constipation, unspecified: Secondary | ICD-10-CM | POA: Diagnosis not present

## 2023-07-18 DIAGNOSIS — T7840XA Allergy, unspecified, initial encounter: Secondary | ICD-10-CM | POA: Diagnosis not present

## 2023-07-18 DIAGNOSIS — G800 Spastic quadriplegic cerebral palsy: Secondary | ICD-10-CM | POA: Diagnosis not present

## 2023-07-18 DIAGNOSIS — E559 Vitamin D deficiency, unspecified: Secondary | ICD-10-CM | POA: Diagnosis not present

## 2023-07-18 DIAGNOSIS — N400 Enlarged prostate without lower urinary tract symptoms: Secondary | ICD-10-CM | POA: Diagnosis not present

## 2023-07-23 DIAGNOSIS — D530 Protein deficiency anemia: Secondary | ICD-10-CM | POA: Diagnosis not present

## 2023-07-23 DIAGNOSIS — R569 Unspecified convulsions: Secondary | ICD-10-CM | POA: Diagnosis not present

## 2023-07-23 DIAGNOSIS — E785 Hyperlipidemia, unspecified: Secondary | ICD-10-CM | POA: Diagnosis not present

## 2023-07-23 DIAGNOSIS — E871 Hypo-osmolality and hyponatremia: Secondary | ICD-10-CM | POA: Diagnosis not present

## 2023-07-24 DIAGNOSIS — E559 Vitamin D deficiency, unspecified: Secondary | ICD-10-CM | POA: Diagnosis not present

## 2023-07-24 DIAGNOSIS — G800 Spastic quadriplegic cerebral palsy: Secondary | ICD-10-CM | POA: Diagnosis not present

## 2023-07-24 DIAGNOSIS — K5900 Constipation, unspecified: Secondary | ICD-10-CM | POA: Diagnosis not present

## 2023-07-24 DIAGNOSIS — N400 Enlarged prostate without lower urinary tract symptoms: Secondary | ICD-10-CM | POA: Diagnosis not present

## 2023-07-25 ENCOUNTER — Ambulatory Visit: Admission: RE | Admit: 2023-07-25 | Payer: Medicare Other | Source: Home / Self Care | Admitting: Gastroenterology

## 2023-07-25 ENCOUNTER — Encounter: Admission: RE | Payer: Self-pay | Source: Home / Self Care

## 2023-07-25 SURGERY — COLONOSCOPY WITH PROPOFOL
Anesthesia: General

## 2023-08-20 DIAGNOSIS — E559 Vitamin D deficiency, unspecified: Secondary | ICD-10-CM | POA: Diagnosis not present

## 2023-08-20 DIAGNOSIS — G800 Spastic quadriplegic cerebral palsy: Secondary | ICD-10-CM | POA: Diagnosis not present

## 2023-08-20 DIAGNOSIS — K5900 Constipation, unspecified: Secondary | ICD-10-CM | POA: Diagnosis not present

## 2023-08-20 DIAGNOSIS — N400 Enlarged prostate without lower urinary tract symptoms: Secondary | ICD-10-CM | POA: Diagnosis not present

## 2023-08-26 DIAGNOSIS — K5900 Constipation, unspecified: Secondary | ICD-10-CM | POA: Diagnosis not present

## 2023-08-26 DIAGNOSIS — E559 Vitamin D deficiency, unspecified: Secondary | ICD-10-CM | POA: Diagnosis not present

## 2023-08-26 DIAGNOSIS — T148XXA Other injury of unspecified body region, initial encounter: Secondary | ICD-10-CM | POA: Diagnosis not present

## 2023-08-26 DIAGNOSIS — G800 Spastic quadriplegic cerebral palsy: Secondary | ICD-10-CM | POA: Diagnosis not present

## 2023-08-28 DIAGNOSIS — K5900 Constipation, unspecified: Secondary | ICD-10-CM | POA: Diagnosis not present

## 2023-08-28 DIAGNOSIS — G800 Spastic quadriplegic cerebral palsy: Secondary | ICD-10-CM | POA: Diagnosis not present

## 2023-08-28 DIAGNOSIS — N400 Enlarged prostate without lower urinary tract symptoms: Secondary | ICD-10-CM | POA: Diagnosis not present

## 2023-08-28 DIAGNOSIS — T148XXA Other injury of unspecified body region, initial encounter: Secondary | ICD-10-CM | POA: Diagnosis not present

## 2023-09-03 DIAGNOSIS — G800 Spastic quadriplegic cerebral palsy: Secondary | ICD-10-CM | POA: Diagnosis not present

## 2023-09-03 DIAGNOSIS — N400 Enlarged prostate without lower urinary tract symptoms: Secondary | ICD-10-CM | POA: Diagnosis not present

## 2023-09-03 DIAGNOSIS — T148XXA Other injury of unspecified body region, initial encounter: Secondary | ICD-10-CM | POA: Diagnosis not present

## 2023-09-03 DIAGNOSIS — K5900 Constipation, unspecified: Secondary | ICD-10-CM | POA: Diagnosis not present

## 2023-09-07 DIAGNOSIS — Z79899 Other long term (current) drug therapy: Secondary | ICD-10-CM | POA: Diagnosis not present

## 2023-09-07 DIAGNOSIS — Z9911 Dependence on respirator [ventilator] status: Secondary | ICD-10-CM | POA: Diagnosis not present

## 2023-09-07 DIAGNOSIS — A4159 Other Gram-negative sepsis: Secondary | ICD-10-CM | POA: Diagnosis not present

## 2023-09-07 DIAGNOSIS — Z978 Presence of other specified devices: Secondary | ICD-10-CM | POA: Diagnosis not present

## 2023-09-07 DIAGNOSIS — Z9889 Other specified postprocedural states: Secondary | ICD-10-CM | POA: Diagnosis not present

## 2023-09-07 DIAGNOSIS — T85890A Other specified complication of nervous system prosthetic devices, implants and grafts, initial encounter: Secondary | ICD-10-CM | POA: Diagnosis not present

## 2023-09-07 DIAGNOSIS — K30 Functional dyspepsia: Secondary | ICD-10-CM | POA: Diagnosis not present

## 2023-09-07 DIAGNOSIS — R1111 Vomiting without nausea: Secondary | ICD-10-CM | POA: Diagnosis not present

## 2023-09-07 DIAGNOSIS — R1031 Right lower quadrant pain: Secondary | ICD-10-CM | POA: Diagnosis not present

## 2023-09-07 DIAGNOSIS — K863 Pseudocyst of pancreas: Secondary | ICD-10-CM | POA: Diagnosis not present

## 2023-09-07 DIAGNOSIS — E876 Hypokalemia: Secondary | ICD-10-CM | POA: Diagnosis not present

## 2023-09-07 DIAGNOSIS — K567 Ileus, unspecified: Secondary | ICD-10-CM | POA: Diagnosis not present

## 2023-09-07 DIAGNOSIS — R9431 Abnormal electrocardiogram [ECG] [EKG]: Secondary | ICD-10-CM | POA: Diagnosis not present

## 2023-09-07 DIAGNOSIS — G9340 Encephalopathy, unspecified: Secondary | ICD-10-CM | POA: Diagnosis not present

## 2023-09-07 DIAGNOSIS — D638 Anemia in other chronic diseases classified elsewhere: Secondary | ICD-10-CM | POA: Diagnosis not present

## 2023-09-07 DIAGNOSIS — K592 Neurogenic bowel, not elsewhere classified: Secondary | ICD-10-CM | POA: Diagnosis not present

## 2023-09-07 DIAGNOSIS — K913 Postprocedural intestinal obstruction, unspecified as to partial versus complete: Secondary | ICD-10-CM | POA: Diagnosis not present

## 2023-09-07 DIAGNOSIS — B9689 Other specified bacterial agents as the cause of diseases classified elsewhere: Secondary | ICD-10-CM | POA: Diagnosis not present

## 2023-09-07 DIAGNOSIS — K668 Other specified disorders of peritoneum: Secondary | ICD-10-CM | POA: Diagnosis not present

## 2023-09-07 DIAGNOSIS — E860 Dehydration: Secondary | ICD-10-CM | POA: Diagnosis not present

## 2023-09-07 DIAGNOSIS — R1110 Vomiting, unspecified: Secondary | ICD-10-CM | POA: Diagnosis not present

## 2023-09-07 DIAGNOSIS — G9349 Other encephalopathy: Secondary | ICD-10-CM | POA: Diagnosis not present

## 2023-09-07 DIAGNOSIS — Z8719 Personal history of other diseases of the digestive system: Secondary | ICD-10-CM | POA: Diagnosis not present

## 2023-09-07 DIAGNOSIS — J432 Centrilobular emphysema: Secondary | ICD-10-CM | POA: Diagnosis not present

## 2023-09-07 DIAGNOSIS — Z4682 Encounter for fitting and adjustment of non-vascular catheter: Secondary | ICD-10-CM | POA: Diagnosis not present

## 2023-09-07 DIAGNOSIS — K660 Peritoneal adhesions (postprocedural) (postinfection): Secondary | ICD-10-CM | POA: Diagnosis not present

## 2023-09-07 DIAGNOSIS — F99 Mental disorder, not otherwise specified: Secondary | ICD-10-CM | POA: Diagnosis not present

## 2023-09-07 DIAGNOSIS — R0689 Other abnormalities of breathing: Secondary | ICD-10-CM | POA: Diagnosis not present

## 2023-09-07 DIAGNOSIS — I959 Hypotension, unspecified: Secondary | ICD-10-CM | POA: Diagnosis not present

## 2023-09-07 DIAGNOSIS — K838 Other specified diseases of biliary tract: Secondary | ICD-10-CM | POA: Diagnosis not present

## 2023-09-07 DIAGNOSIS — M25552 Pain in left hip: Secondary | ICD-10-CM | POA: Diagnosis not present

## 2023-09-07 DIAGNOSIS — R000 Tachycardia, unspecified: Secondary | ICD-10-CM | POA: Diagnosis not present

## 2023-09-07 DIAGNOSIS — A419 Sepsis, unspecified organism: Secondary | ICD-10-CM | POA: Diagnosis not present

## 2023-09-07 DIAGNOSIS — G0490 Encephalitis and encephalomyelitis, unspecified: Secondary | ICD-10-CM | POA: Diagnosis not present

## 2023-09-07 DIAGNOSIS — R1012 Left upper quadrant pain: Secondary | ICD-10-CM | POA: Diagnosis not present

## 2023-09-07 DIAGNOSIS — R188 Other ascites: Secondary | ICD-10-CM | POA: Diagnosis not present

## 2023-09-07 DIAGNOSIS — E872 Acidosis, unspecified: Secondary | ICD-10-CM | POA: Diagnosis not present

## 2023-09-07 DIAGNOSIS — R194 Change in bowel habit: Secondary | ICD-10-CM | POA: Diagnosis not present

## 2023-09-07 DIAGNOSIS — Z1152 Encounter for screening for COVID-19: Secondary | ICD-10-CM | POA: Diagnosis not present

## 2023-09-07 DIAGNOSIS — G800 Spastic quadriplegic cerebral palsy: Secondary | ICD-10-CM | POA: Diagnosis not present

## 2023-09-07 DIAGNOSIS — N281 Cyst of kidney, acquired: Secondary | ICD-10-CM | POA: Diagnosis not present

## 2023-09-07 DIAGNOSIS — R739 Hyperglycemia, unspecified: Secondary | ICD-10-CM | POA: Diagnosis not present

## 2023-09-07 DIAGNOSIS — R112 Nausea with vomiting, unspecified: Secondary | ICD-10-CM | POA: Diagnosis not present

## 2023-09-07 DIAGNOSIS — G809 Cerebral palsy, unspecified: Secondary | ICD-10-CM | POA: Diagnosis not present

## 2023-09-07 DIAGNOSIS — K2289 Other specified disease of esophagus: Secondary | ICD-10-CM | POA: Diagnosis not present

## 2023-09-07 DIAGNOSIS — R6510 Systemic inflammatory response syndrome (SIRS) of non-infectious origin without acute organ dysfunction: Secondary | ICD-10-CM | POA: Diagnosis not present

## 2023-09-07 DIAGNOSIS — R9389 Abnormal findings on diagnostic imaging of other specified body structures: Secondary | ICD-10-CM | POA: Diagnosis not present

## 2023-09-07 DIAGNOSIS — N400 Enlarged prostate without lower urinary tract symptoms: Secondary | ICD-10-CM | POA: Diagnosis not present

## 2023-09-07 DIAGNOSIS — R6250 Unspecified lack of expected normal physiological development in childhood: Secondary | ICD-10-CM | POA: Diagnosis not present

## 2023-09-07 DIAGNOSIS — Z9189 Other specified personal risk factors, not elsewhere classified: Secondary | ICD-10-CM | POA: Diagnosis not present

## 2023-09-07 DIAGNOSIS — R918 Other nonspecific abnormal finding of lung field: Secondary | ICD-10-CM | POA: Diagnosis not present

## 2023-09-07 DIAGNOSIS — R34 Anuria and oliguria: Secondary | ICD-10-CM | POA: Diagnosis not present

## 2023-09-07 DIAGNOSIS — J9811 Atelectasis: Secondary | ICD-10-CM | POA: Diagnosis not present

## 2023-09-07 DIAGNOSIS — K5900 Constipation, unspecified: Secondary | ICD-10-CM | POA: Diagnosis not present

## 2023-09-07 DIAGNOSIS — K562 Volvulus: Secondary | ICD-10-CM | POA: Diagnosis not present

## 2023-09-07 DIAGNOSIS — R140 Abdominal distension (gaseous): Secondary | ICD-10-CM | POA: Diagnosis not present

## 2023-09-07 DIAGNOSIS — Z4659 Encounter for fitting and adjustment of other gastrointestinal appliance and device: Secondary | ICD-10-CM | POA: Diagnosis not present

## 2023-09-07 DIAGNOSIS — J986 Disorders of diaphragm: Secondary | ICD-10-CM | POA: Diagnosis not present

## 2023-09-07 DIAGNOSIS — K56699 Other intestinal obstruction unspecified as to partial versus complete obstruction: Secondary | ICD-10-CM | POA: Diagnosis not present

## 2023-09-07 DIAGNOSIS — R109 Unspecified abdominal pain: Secondary | ICD-10-CM | POA: Diagnosis not present

## 2023-09-07 DIAGNOSIS — M6281 Muscle weakness (generalized): Secondary | ICD-10-CM | POA: Diagnosis not present

## 2023-09-07 DIAGNOSIS — Z7409 Other reduced mobility: Secondary | ICD-10-CM | POA: Diagnosis not present

## 2023-09-07 DIAGNOSIS — K6389 Other specified diseases of intestine: Secondary | ICD-10-CM | POA: Diagnosis not present

## 2023-09-07 DIAGNOSIS — R4182 Altered mental status, unspecified: Secondary | ICD-10-CM | POA: Diagnosis not present

## 2023-09-07 DIAGNOSIS — Q040 Congenital malformations of corpus callosum: Secondary | ICD-10-CM | POA: Diagnosis not present

## 2023-09-07 DIAGNOSIS — K560 Paralytic ileus: Secondary | ICD-10-CM | POA: Diagnosis not present

## 2023-09-07 DIAGNOSIS — K56609 Unspecified intestinal obstruction, unspecified as to partial versus complete obstruction: Secondary | ICD-10-CM | POA: Diagnosis not present

## 2023-09-07 DIAGNOSIS — R911 Solitary pulmonary nodule: Secondary | ICD-10-CM | POA: Diagnosis not present

## 2023-09-07 DIAGNOSIS — R0902 Hypoxemia: Secondary | ICD-10-CM | POA: Diagnosis not present

## 2023-09-07 DIAGNOSIS — J690 Pneumonitis due to inhalation of food and vomit: Secondary | ICD-10-CM | POA: Diagnosis not present

## 2023-09-07 DIAGNOSIS — K7460 Unspecified cirrhosis of liver: Secondary | ICD-10-CM | POA: Diagnosis not present

## 2023-09-07 DIAGNOSIS — G918 Other hydrocephalus: Secondary | ICD-10-CM | POA: Diagnosis not present

## 2023-09-07 DIAGNOSIS — R197 Diarrhea, unspecified: Secondary | ICD-10-CM | POA: Diagnosis not present

## 2023-09-07 DIAGNOSIS — R279 Unspecified lack of coordination: Secondary | ICD-10-CM | POA: Diagnosis not present

## 2023-09-07 DIAGNOSIS — R110 Nausea: Secondary | ICD-10-CM | POA: Diagnosis not present

## 2023-09-07 DIAGNOSIS — R7881 Bacteremia: Secondary | ICD-10-CM | POA: Diagnosis not present

## 2023-09-07 DIAGNOSIS — R6521 Severe sepsis with septic shock: Secondary | ICD-10-CM | POA: Diagnosis not present

## 2023-09-07 DIAGNOSIS — R100 Acute abdomen: Secondary | ICD-10-CM | POA: Diagnosis not present

## 2023-09-07 DIAGNOSIS — R5381 Other malaise: Secondary | ICD-10-CM | POA: Diagnosis not present

## 2023-09-07 DIAGNOSIS — Z982 Presence of cerebrospinal fluid drainage device: Secondary | ICD-10-CM | POA: Diagnosis not present

## 2023-09-07 DIAGNOSIS — E870 Hyperosmolality and hypernatremia: Secondary | ICD-10-CM | POA: Diagnosis not present

## 2023-09-07 DIAGNOSIS — T797XXA Traumatic subcutaneous emphysema, initial encounter: Secondary | ICD-10-CM | POA: Diagnosis not present

## 2023-09-07 DIAGNOSIS — G919 Hydrocephalus, unspecified: Secondary | ICD-10-CM | POA: Diagnosis not present

## 2023-09-07 DIAGNOSIS — S36428A Contusion of other part of small intestine, initial encounter: Secondary | ICD-10-CM | POA: Diagnosis not present

## 2023-09-07 DIAGNOSIS — R0603 Acute respiratory distress: Secondary | ICD-10-CM | POA: Diagnosis not present

## 2023-09-07 DIAGNOSIS — Z452 Encounter for adjustment and management of vascular access device: Secondary | ICD-10-CM | POA: Diagnosis not present

## 2023-09-07 DIAGNOSIS — K9189 Other postprocedural complications and disorders of digestive system: Secondary | ICD-10-CM | POA: Diagnosis not present

## 2023-09-07 DIAGNOSIS — B961 Klebsiella pneumoniae [K. pneumoniae] as the cause of diseases classified elsewhere: Secondary | ICD-10-CM | POA: Diagnosis not present

## 2023-09-07 DIAGNOSIS — D6489 Other specified anemias: Secondary | ICD-10-CM | POA: Diagnosis not present

## 2023-09-07 DIAGNOSIS — R509 Fever, unspecified: Secondary | ICD-10-CM | POA: Diagnosis not present

## 2023-09-07 DIAGNOSIS — D72829 Elevated white blood cell count, unspecified: Secondary | ICD-10-CM | POA: Diagnosis not present

## 2023-09-08 DIAGNOSIS — R0689 Other abnormalities of breathing: Secondary | ICD-10-CM | POA: Diagnosis not present

## 2023-09-08 DIAGNOSIS — Z9889 Other specified postprocedural states: Secondary | ICD-10-CM | POA: Diagnosis not present

## 2023-09-08 DIAGNOSIS — Z4682 Encounter for fitting and adjustment of non-vascular catheter: Secondary | ICD-10-CM | POA: Diagnosis not present

## 2023-09-08 DIAGNOSIS — K56609 Unspecified intestinal obstruction, unspecified as to partial versus complete obstruction: Secondary | ICD-10-CM | POA: Diagnosis not present

## 2023-09-08 DIAGNOSIS — R34 Anuria and oliguria: Secondary | ICD-10-CM | POA: Diagnosis not present

## 2023-09-08 DIAGNOSIS — R918 Other nonspecific abnormal finding of lung field: Secondary | ICD-10-CM | POA: Diagnosis not present

## 2023-09-08 DIAGNOSIS — R4182 Altered mental status, unspecified: Secondary | ICD-10-CM | POA: Diagnosis not present

## 2023-09-08 DIAGNOSIS — E860 Dehydration: Secondary | ICD-10-CM | POA: Diagnosis not present

## 2023-09-08 DIAGNOSIS — R6510 Systemic inflammatory response syndrome (SIRS) of non-infectious origin without acute organ dysfunction: Secondary | ICD-10-CM | POA: Diagnosis not present

## 2023-09-08 DIAGNOSIS — Z452 Encounter for adjustment and management of vascular access device: Secondary | ICD-10-CM | POA: Diagnosis not present

## 2023-09-08 DIAGNOSIS — K660 Peritoneal adhesions (postprocedural) (postinfection): Secondary | ICD-10-CM | POA: Diagnosis not present

## 2023-09-09 DIAGNOSIS — J9811 Atelectasis: Secondary | ICD-10-CM | POA: Diagnosis not present

## 2023-09-09 DIAGNOSIS — Z9911 Dependence on respirator [ventilator] status: Secondary | ICD-10-CM | POA: Diagnosis not present

## 2023-09-09 DIAGNOSIS — E876 Hypokalemia: Secondary | ICD-10-CM | POA: Diagnosis not present

## 2023-09-09 DIAGNOSIS — I959 Hypotension, unspecified: Secondary | ICD-10-CM | POA: Diagnosis not present

## 2023-09-09 DIAGNOSIS — R918 Other nonspecific abnormal finding of lung field: Secondary | ICD-10-CM | POA: Diagnosis not present

## 2023-09-09 DIAGNOSIS — R0902 Hypoxemia: Secondary | ICD-10-CM | POA: Diagnosis not present

## 2023-09-10 DIAGNOSIS — R918 Other nonspecific abnormal finding of lung field: Secondary | ICD-10-CM | POA: Diagnosis not present

## 2023-09-10 DIAGNOSIS — Z452 Encounter for adjustment and management of vascular access device: Secondary | ICD-10-CM | POA: Diagnosis not present

## 2023-09-10 DIAGNOSIS — Z4659 Encounter for fitting and adjustment of other gastrointestinal appliance and device: Secondary | ICD-10-CM | POA: Diagnosis not present

## 2023-09-10 DIAGNOSIS — E876 Hypokalemia: Secondary | ICD-10-CM | POA: Diagnosis not present

## 2023-09-10 DIAGNOSIS — I959 Hypotension, unspecified: Secondary | ICD-10-CM | POA: Diagnosis not present

## 2023-09-10 DIAGNOSIS — Z4682 Encounter for fitting and adjustment of non-vascular catheter: Secondary | ICD-10-CM | POA: Diagnosis not present

## 2023-09-10 DIAGNOSIS — Z9911 Dependence on respirator [ventilator] status: Secondary | ICD-10-CM | POA: Diagnosis not present

## 2023-09-16 DIAGNOSIS — Z982 Presence of cerebrospinal fluid drainage device: Secondary | ICD-10-CM | POA: Diagnosis not present

## 2023-09-16 DIAGNOSIS — Z452 Encounter for adjustment and management of vascular access device: Secondary | ICD-10-CM | POA: Diagnosis not present

## 2023-09-16 DIAGNOSIS — Z4682 Encounter for fitting and adjustment of non-vascular catheter: Secondary | ICD-10-CM | POA: Diagnosis not present

## 2023-09-16 DIAGNOSIS — K838 Other specified diseases of biliary tract: Secondary | ICD-10-CM | POA: Diagnosis not present

## 2023-09-16 DIAGNOSIS — N281 Cyst of kidney, acquired: Secondary | ICD-10-CM | POA: Diagnosis not present

## 2023-09-17 DIAGNOSIS — Z452 Encounter for adjustment and management of vascular access device: Secondary | ICD-10-CM | POA: Diagnosis not present

## 2023-09-17 DIAGNOSIS — Z4682 Encounter for fitting and adjustment of non-vascular catheter: Secondary | ICD-10-CM | POA: Diagnosis not present

## 2023-09-17 DIAGNOSIS — J9811 Atelectasis: Secondary | ICD-10-CM | POA: Diagnosis not present

## 2023-09-17 DIAGNOSIS — R9389 Abnormal findings on diagnostic imaging of other specified body structures: Secondary | ICD-10-CM | POA: Diagnosis not present

## 2023-09-21 DIAGNOSIS — J9811 Atelectasis: Secondary | ICD-10-CM | POA: Diagnosis not present

## 2023-09-21 DIAGNOSIS — Z4682 Encounter for fitting and adjustment of non-vascular catheter: Secondary | ICD-10-CM | POA: Diagnosis not present

## 2023-09-21 DIAGNOSIS — Z452 Encounter for adjustment and management of vascular access device: Secondary | ICD-10-CM | POA: Diagnosis not present

## 2023-09-23 DIAGNOSIS — K56609 Unspecified intestinal obstruction, unspecified as to partial versus complete obstruction: Secondary | ICD-10-CM | POA: Diagnosis not present

## 2023-09-23 DIAGNOSIS — R188 Other ascites: Secondary | ICD-10-CM | POA: Diagnosis not present

## 2023-09-23 DIAGNOSIS — N281 Cyst of kidney, acquired: Secondary | ICD-10-CM | POA: Diagnosis not present

## 2023-09-23 DIAGNOSIS — K567 Ileus, unspecified: Secondary | ICD-10-CM | POA: Diagnosis not present

## 2023-09-23 DIAGNOSIS — Z452 Encounter for adjustment and management of vascular access device: Secondary | ICD-10-CM | POA: Diagnosis not present

## 2023-09-24 DIAGNOSIS — Z452 Encounter for adjustment and management of vascular access device: Secondary | ICD-10-CM | POA: Diagnosis not present

## 2023-09-24 DIAGNOSIS — Z4682 Encounter for fitting and adjustment of non-vascular catheter: Secondary | ICD-10-CM | POA: Diagnosis not present

## 2023-09-26 DIAGNOSIS — Z452 Encounter for adjustment and management of vascular access device: Secondary | ICD-10-CM | POA: Diagnosis not present

## 2023-09-26 DIAGNOSIS — K592 Neurogenic bowel, not elsewhere classified: Secondary | ICD-10-CM | POA: Diagnosis not present

## 2023-09-26 DIAGNOSIS — R197 Diarrhea, unspecified: Secondary | ICD-10-CM | POA: Diagnosis not present

## 2023-09-26 DIAGNOSIS — Z4682 Encounter for fitting and adjustment of non-vascular catheter: Secondary | ICD-10-CM | POA: Diagnosis not present

## 2023-09-26 DIAGNOSIS — K560 Paralytic ileus: Secondary | ICD-10-CM | POA: Diagnosis not present

## 2023-09-26 DIAGNOSIS — K5900 Constipation, unspecified: Secondary | ICD-10-CM | POA: Diagnosis not present

## 2023-09-28 DIAGNOSIS — K56609 Unspecified intestinal obstruction, unspecified as to partial versus complete obstruction: Secondary | ICD-10-CM | POA: Diagnosis not present

## 2023-09-29 DIAGNOSIS — K5900 Constipation, unspecified: Secondary | ICD-10-CM | POA: Diagnosis not present

## 2023-09-29 DIAGNOSIS — Z982 Presence of cerebrospinal fluid drainage device: Secondary | ICD-10-CM | POA: Diagnosis not present

## 2023-09-29 DIAGNOSIS — J9811 Atelectasis: Secondary | ICD-10-CM | POA: Diagnosis not present

## 2023-09-29 DIAGNOSIS — K592 Neurogenic bowel, not elsewhere classified: Secondary | ICD-10-CM | POA: Diagnosis not present

## 2023-09-29 DIAGNOSIS — R5381 Other malaise: Secondary | ICD-10-CM | POA: Diagnosis not present

## 2023-09-30 DIAGNOSIS — Z79899 Other long term (current) drug therapy: Secondary | ICD-10-CM | POA: Diagnosis not present

## 2023-09-30 DIAGNOSIS — Z452 Encounter for adjustment and management of vascular access device: Secondary | ICD-10-CM | POA: Diagnosis not present

## 2023-09-30 DIAGNOSIS — Z978 Presence of other specified devices: Secondary | ICD-10-CM | POA: Diagnosis not present

## 2023-09-30 DIAGNOSIS — K6389 Other specified diseases of intestine: Secondary | ICD-10-CM | POA: Diagnosis not present

## 2023-09-30 DIAGNOSIS — Z8719 Personal history of other diseases of the digestive system: Secondary | ICD-10-CM | POA: Diagnosis not present

## 2023-09-30 DIAGNOSIS — Z9889 Other specified postprocedural states: Secondary | ICD-10-CM | POA: Diagnosis not present

## 2023-09-30 DIAGNOSIS — G809 Cerebral palsy, unspecified: Secondary | ICD-10-CM | POA: Diagnosis not present

## 2023-10-01 DIAGNOSIS — R1012 Left upper quadrant pain: Secondary | ICD-10-CM | POA: Diagnosis not present

## 2023-10-01 DIAGNOSIS — Z978 Presence of other specified devices: Secondary | ICD-10-CM | POA: Diagnosis not present

## 2023-10-01 DIAGNOSIS — Z79899 Other long term (current) drug therapy: Secondary | ICD-10-CM | POA: Diagnosis not present

## 2023-10-01 DIAGNOSIS — R194 Change in bowel habit: Secondary | ICD-10-CM | POA: Diagnosis not present

## 2023-10-01 DIAGNOSIS — G809 Cerebral palsy, unspecified: Secondary | ICD-10-CM | POA: Diagnosis not present

## 2023-10-01 DIAGNOSIS — Z9889 Other specified postprocedural states: Secondary | ICD-10-CM | POA: Diagnosis not present

## 2023-10-03 DIAGNOSIS — Z452 Encounter for adjustment and management of vascular access device: Secondary | ICD-10-CM | POA: Diagnosis not present

## 2023-10-03 DIAGNOSIS — K567 Ileus, unspecified: Secondary | ICD-10-CM | POA: Diagnosis not present

## 2023-10-03 DIAGNOSIS — K9189 Other postprocedural complications and disorders of digestive system: Secondary | ICD-10-CM | POA: Diagnosis not present

## 2023-10-07 DIAGNOSIS — R9389 Abnormal findings on diagnostic imaging of other specified body structures: Secondary | ICD-10-CM | POA: Diagnosis not present

## 2023-10-07 DIAGNOSIS — D72829 Elevated white blood cell count, unspecified: Secondary | ICD-10-CM | POA: Diagnosis not present

## 2023-10-07 DIAGNOSIS — J9811 Atelectasis: Secondary | ICD-10-CM | POA: Diagnosis not present

## 2023-10-07 DIAGNOSIS — R112 Nausea with vomiting, unspecified: Secondary | ICD-10-CM | POA: Diagnosis not present

## 2023-10-07 DIAGNOSIS — Z4682 Encounter for fitting and adjustment of non-vascular catheter: Secondary | ICD-10-CM | POA: Diagnosis not present

## 2023-10-08 DIAGNOSIS — A419 Sepsis, unspecified organism: Secondary | ICD-10-CM | POA: Diagnosis not present

## 2023-11-02 IMAGING — US US PELVIS LIMITED
1 series · 12 of 12 positions shown · non-contrast
Comparison: CT abdomen and pelvis 01/29/2022
COMPARISON: CT abdomen and pelvis 01/29/2022

Addendum:
CLINICAL DATA: Inguinal mass on CT.

EXAM:
US PELVIS LIMITED
TECHNIQUE: Grayscale sonographic images of the right lower quadrant

[Series 1: us pelvis limited · 12 acquisitions, 12 frames shown]
[im 1/12]
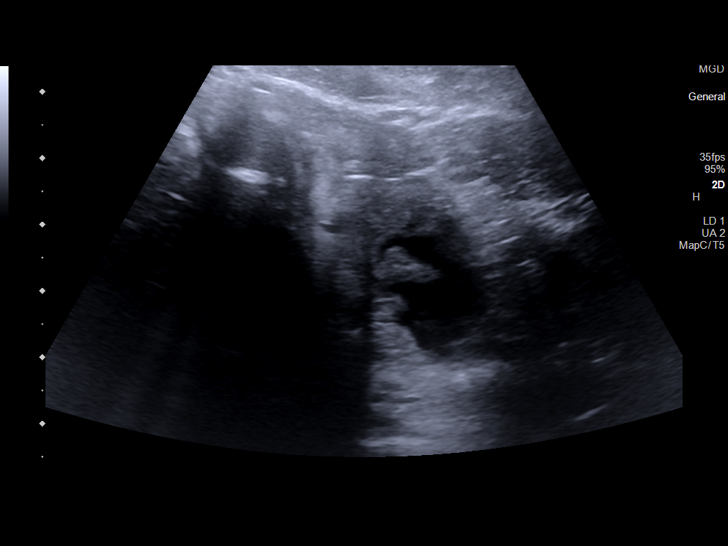
[im 2/12]
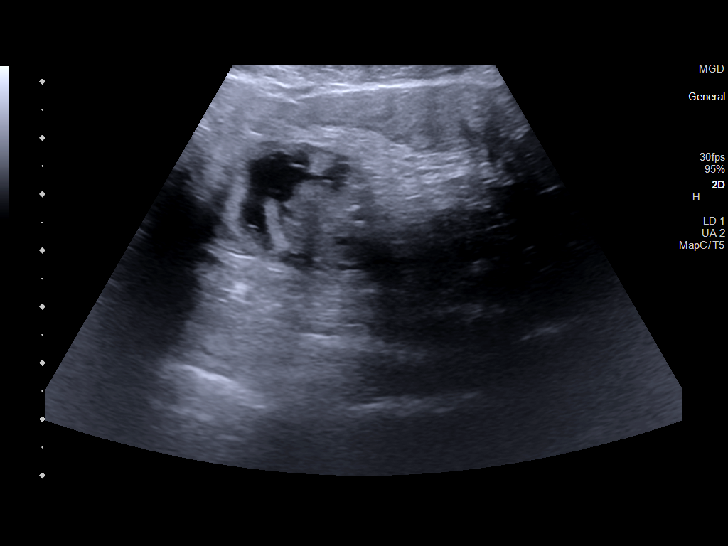
[im 3/12]
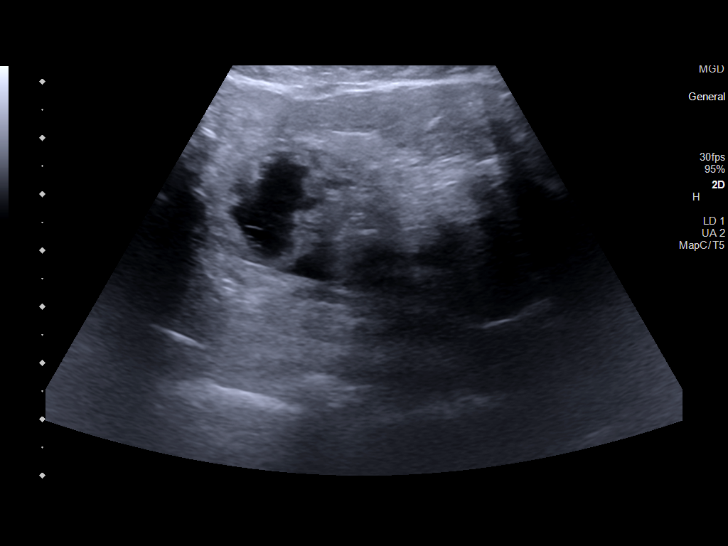
[im 4/12]
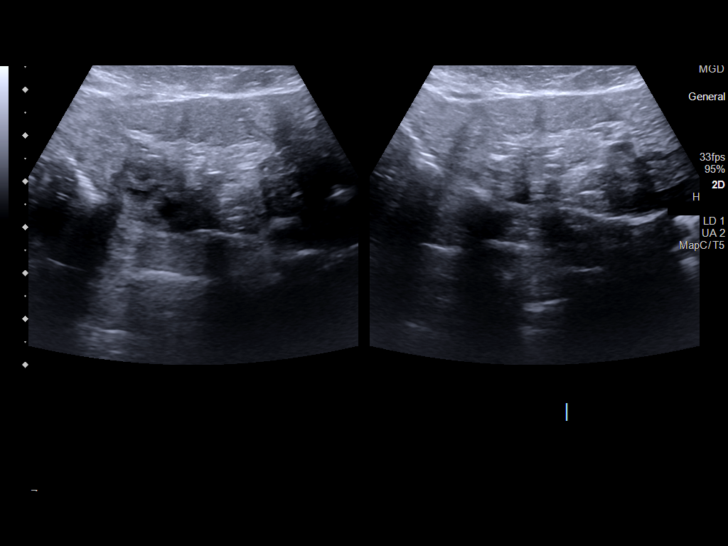
[im 5/12]
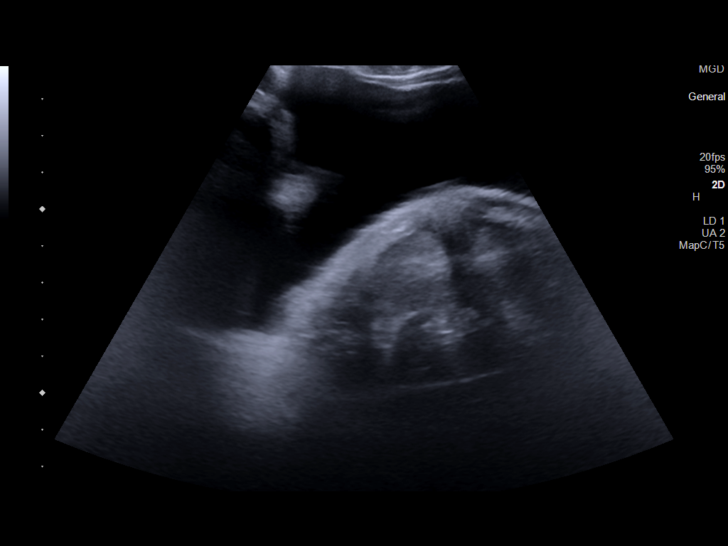
[im 6/12]
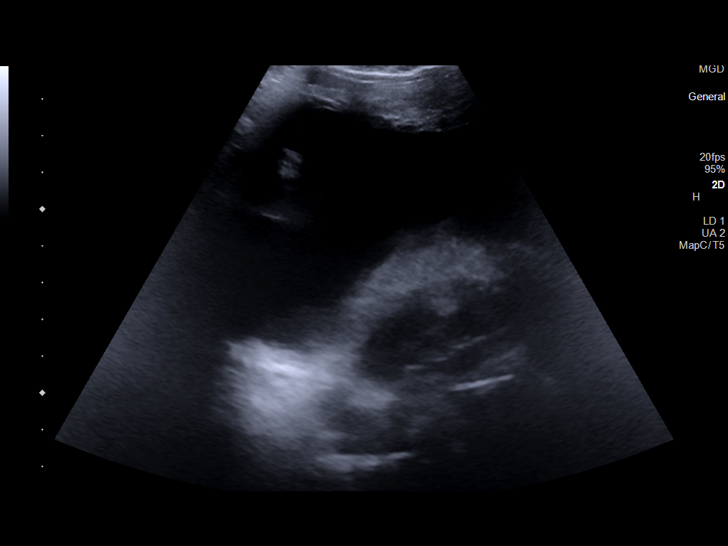
[im 7/12]
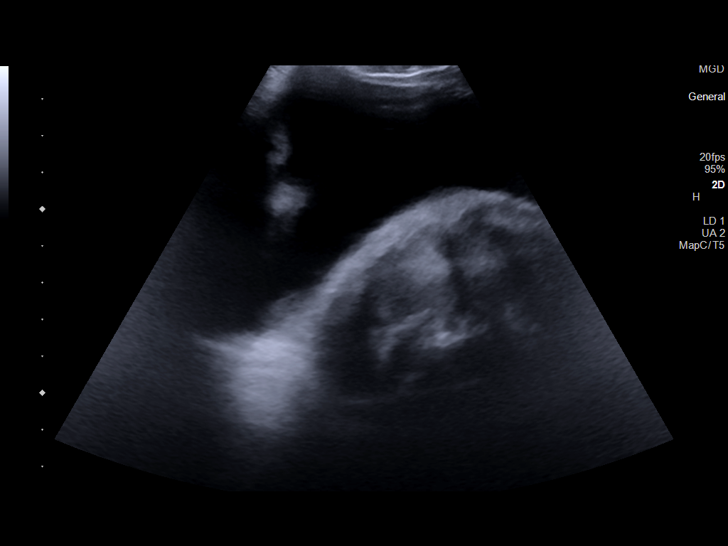
[im 8/12]
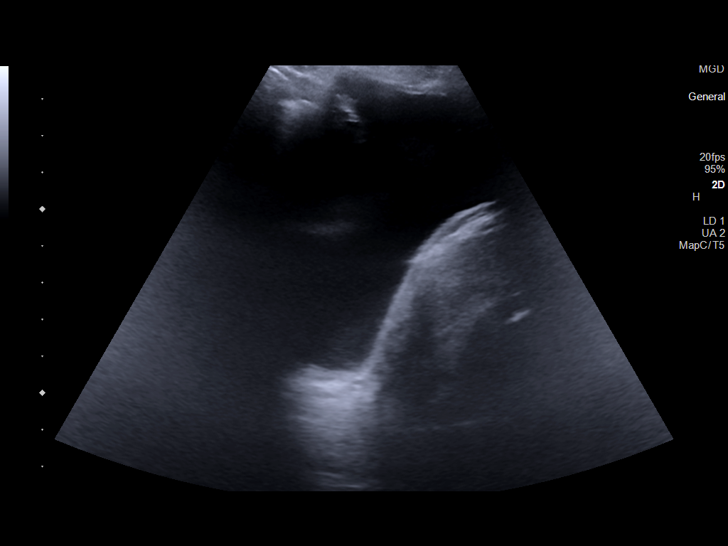
[im 9/12]
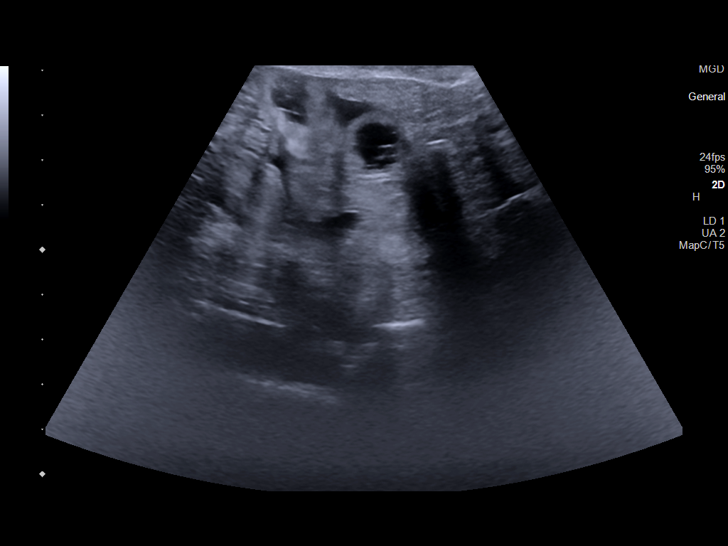
[im 10/12]
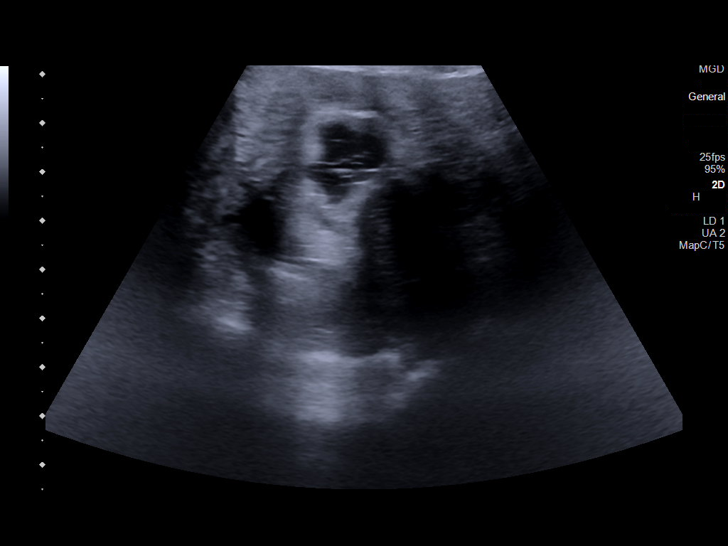
[im 11/12]
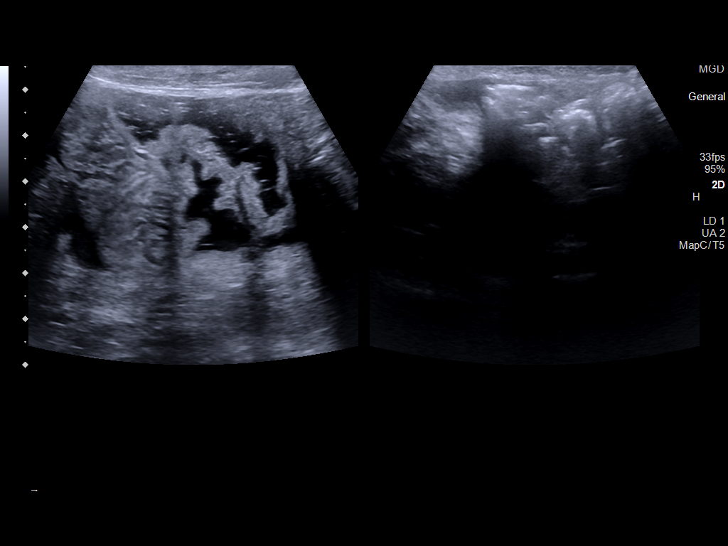
[im 12/12]
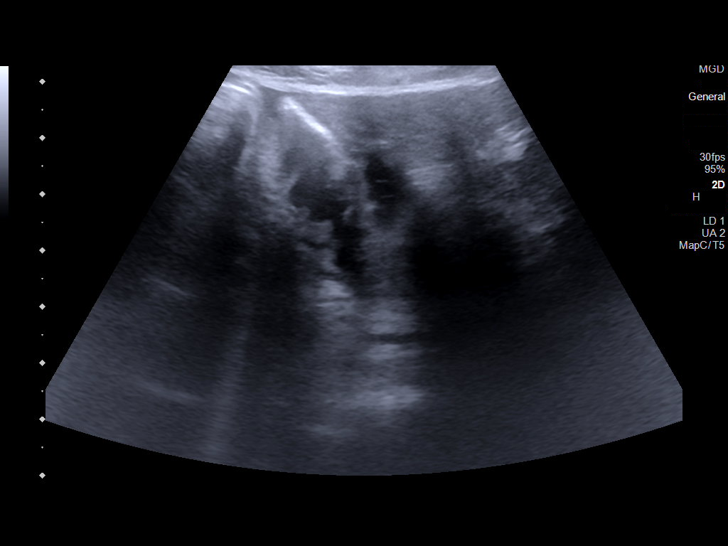

[12 of 12 positions shown; findings below may reference images not displayed]

FINDINGS: Grayscale sonographic imaging of the right lower quadrant of the
abdomen. Normal bowel loops visualized. No free fluid. Bladder is
within normal limits. Note is made that the right inguinal canal was
not included on imaging.
IMPRESSION: 1. Normal sonographic examination of the right lower quadrant of the
abdomen. Note is made that the right inguinal canal was not included
on imaging (area of concern on CT). Consider further evaluation with
dedicated scrotal ultrasound or right inguinal ultrasound.

ADDENDUM:
History of inguinal area discovered on recent CT concern for
undescended testis. Images were repeated in order to better
characterize the area which was not imaged initially with
ultrasound.

RIGHT testicle: Undescended in the RIGHT inguinal canal as on the
CT. [DATE] x 1.3 x 2.5 cm with signs of vascular flow. Adjacent to the
RIGHT testicle is a well-defined anechoic cystic lesion that may be
within the epididymal tail and is clearly not within the testis,
potentially arising from spermatic cord ir or epididymis and not
associated with flow.

LEFT testicle: 3.9 x 1.4 x 2.1 cm. Normal sonographic appearance of
the LEFT testis which shows limited evaluation due to patient
position. Epididymis not well evaluated on the LEFT.

Revised impression

1. Testicle in the RIGHT inguinal canal with normal appearance of
the testis. This may be undescended or may be in this position due
to chronic reasons related to the position of the patient's legs.
Intermittent follow-up may be warranted given the possibility of
undescended testis. Could consider six-month follow-up or continued
correlation with physical examination.

2.  Spermatic cord or epididymal tail cyst.

*** End of Addendum ***
FINDINGS: Grayscale sonographic imaging of the right lower quadrant of the
abdomen. Normal bowel loops visualized. No free fluid. Bladder is
within normal limits. Note is made that the right inguinal canal was
not included on imaging.
IMPRESSION: 1. Normal sonographic examination of the right lower quadrant of the
abdomen. Note is made that the right inguinal canal was not included
on imaging (area of concern on CT). Consider further evaluation with
dedicated scrotal ultrasound or right inguinal ultrasound.

## 2023-11-07 DIAGNOSIS — K56609 Unspecified intestinal obstruction, unspecified as to partial versus complete obstruction: Secondary | ICD-10-CM | POA: Diagnosis not present

## 2023-11-07 DIAGNOSIS — K567 Ileus, unspecified: Secondary | ICD-10-CM | POA: Diagnosis not present

## 2023-11-07 DIAGNOSIS — Z9889 Other specified postprocedural states: Secondary | ICD-10-CM | POA: Diagnosis not present

## 2023-11-07 DIAGNOSIS — B961 Klebsiella pneumoniae [K. pneumoniae] as the cause of diseases classified elsewhere: Secondary | ICD-10-CM | POA: Diagnosis not present

## 2023-11-07 DIAGNOSIS — B965 Pseudomonas (aeruginosa) (mallei) (pseudomallei) as the cause of diseases classified elsewhere: Secondary | ICD-10-CM | POA: Diagnosis not present

## 2023-11-07 DIAGNOSIS — K5900 Constipation, unspecified: Secondary | ICD-10-CM | POA: Diagnosis not present

## 2023-11-07 DIAGNOSIS — N390 Urinary tract infection, site not specified: Secondary | ICD-10-CM | POA: Diagnosis not present

## 2023-11-07 DIAGNOSIS — R7881 Bacteremia: Secondary | ICD-10-CM | POA: Diagnosis not present

## 2023-11-13 DIAGNOSIS — R4182 Altered mental status, unspecified: Secondary | ICD-10-CM | POA: Diagnosis not present

## 2023-11-13 DIAGNOSIS — R001 Bradycardia, unspecified: Secondary | ICD-10-CM | POA: Diagnosis present

## 2023-11-13 DIAGNOSIS — R6250 Unspecified lack of expected normal physiological development in childhood: Secondary | ICD-10-CM | POA: Diagnosis present

## 2023-11-13 DIAGNOSIS — R52 Pain, unspecified: Secondary | ICD-10-CM | POA: Diagnosis not present

## 2023-11-13 DIAGNOSIS — H0289 Other specified disorders of eyelid: Secondary | ICD-10-CM | POA: Diagnosis not present

## 2023-11-13 DIAGNOSIS — R000 Tachycardia, unspecified: Secondary | ICD-10-CM | POA: Diagnosis not present

## 2023-11-13 DIAGNOSIS — R519 Headache, unspecified: Secondary | ICD-10-CM | POA: Diagnosis not present

## 2023-11-13 DIAGNOSIS — H01006 Unspecified blepharitis left eye, unspecified eyelid: Secondary | ICD-10-CM | POA: Diagnosis present

## 2023-11-13 DIAGNOSIS — J90 Pleural effusion, not elsewhere classified: Secondary | ICD-10-CM | POA: Diagnosis not present

## 2023-11-13 DIAGNOSIS — R279 Unspecified lack of coordination: Secondary | ICD-10-CM | POA: Diagnosis not present

## 2023-11-13 DIAGNOSIS — K5900 Constipation, unspecified: Secondary | ICD-10-CM | POA: Diagnosis not present

## 2023-11-13 DIAGNOSIS — E876 Hypokalemia: Secondary | ICD-10-CM | POA: Diagnosis present

## 2023-11-13 DIAGNOSIS — H01003 Unspecified blepharitis right eye, unspecified eyelid: Secondary | ICD-10-CM | POA: Diagnosis present

## 2023-11-13 DIAGNOSIS — G809 Cerebral palsy, unspecified: Secondary | ICD-10-CM | POA: Diagnosis present

## 2023-11-13 DIAGNOSIS — H9201 Otalgia, right ear: Secondary | ICD-10-CM | POA: Diagnosis not present

## 2023-11-13 DIAGNOSIS — D649 Anemia, unspecified: Secondary | ICD-10-CM | POA: Diagnosis not present

## 2023-11-13 DIAGNOSIS — T8502XA Displacement of ventricular intracranial (communicating) shunt, initial encounter: Secondary | ICD-10-CM | POA: Diagnosis present

## 2023-11-13 DIAGNOSIS — G918 Other hydrocephalus: Secondary | ICD-10-CM | POA: Diagnosis not present

## 2023-11-13 DIAGNOSIS — Z9981 Dependence on supplemental oxygen: Secondary | ICD-10-CM | POA: Diagnosis not present

## 2023-11-13 DIAGNOSIS — J9811 Atelectasis: Secondary | ICD-10-CM | POA: Diagnosis not present

## 2023-11-13 DIAGNOSIS — R918 Other nonspecific abnormal finding of lung field: Secondary | ICD-10-CM | POA: Diagnosis not present

## 2023-11-13 DIAGNOSIS — Z7409 Other reduced mobility: Secondary | ICD-10-CM | POA: Diagnosis not present

## 2023-11-13 DIAGNOSIS — R5381 Other malaise: Secondary | ICD-10-CM | POA: Diagnosis not present

## 2023-11-13 DIAGNOSIS — Z9889 Other specified postprocedural states: Secondary | ICD-10-CM | POA: Diagnosis not present

## 2023-11-13 DIAGNOSIS — M6281 Muscle weakness (generalized): Secondary | ICD-10-CM | POA: Diagnosis not present

## 2023-11-13 DIAGNOSIS — Z982 Presence of cerebrospinal fluid drainage device: Secondary | ICD-10-CM | POA: Diagnosis not present

## 2023-11-13 DIAGNOSIS — R9389 Abnormal findings on diagnostic imaging of other specified body structures: Secondary | ICD-10-CM | POA: Diagnosis not present

## 2023-11-13 DIAGNOSIS — H02883 Meibomian gland dysfunction of right eye, unspecified eyelid: Secondary | ICD-10-CM | POA: Diagnosis present

## 2023-11-13 DIAGNOSIS — H02886 Meibomian gland dysfunction of left eye, unspecified eyelid: Secondary | ICD-10-CM | POA: Diagnosis present

## 2023-11-13 DIAGNOSIS — G4489 Other headache syndrome: Secondary | ICD-10-CM | POA: Diagnosis not present

## 2023-11-13 DIAGNOSIS — T8509XA Other mechanical complication of ventricular intracranial (communicating) shunt, initial encounter: Secondary | ICD-10-CM | POA: Diagnosis not present

## 2023-11-13 DIAGNOSIS — F99 Mental disorder, not otherwise specified: Secondary | ICD-10-CM | POA: Diagnosis not present

## 2023-11-13 DIAGNOSIS — Z1152 Encounter for screening for COVID-19: Secondary | ICD-10-CM | POA: Diagnosis not present

## 2023-11-13 DIAGNOSIS — J189 Pneumonia, unspecified organism: Secondary | ICD-10-CM | POA: Diagnosis not present

## 2023-11-13 DIAGNOSIS — T82898A Other specified complication of vascular prosthetic devices, implants and grafts, initial encounter: Secondary | ICD-10-CM | POA: Diagnosis not present

## 2023-11-14 DIAGNOSIS — H0289 Other specified disorders of eyelid: Secondary | ICD-10-CM | POA: Diagnosis not present

## 2023-11-14 DIAGNOSIS — G918 Other hydrocephalus: Secondary | ICD-10-CM | POA: Diagnosis not present

## 2023-11-14 DIAGNOSIS — E876 Hypokalemia: Secondary | ICD-10-CM | POA: Diagnosis present

## 2023-11-14 DIAGNOSIS — D649 Anemia, unspecified: Secondary | ICD-10-CM | POA: Diagnosis not present

## 2023-11-14 DIAGNOSIS — R6250 Unspecified lack of expected normal physiological development in childhood: Secondary | ICD-10-CM | POA: Diagnosis present

## 2023-11-14 DIAGNOSIS — Z9889 Other specified postprocedural states: Secondary | ICD-10-CM | POA: Diagnosis not present

## 2023-11-14 DIAGNOSIS — M6281 Muscle weakness (generalized): Secondary | ICD-10-CM | POA: Diagnosis not present

## 2023-11-14 DIAGNOSIS — Z7409 Other reduced mobility: Secondary | ICD-10-CM | POA: Diagnosis not present

## 2023-11-14 DIAGNOSIS — R52 Pain, unspecified: Secondary | ICD-10-CM | POA: Diagnosis not present

## 2023-11-14 DIAGNOSIS — Z982 Presence of cerebrospinal fluid drainage device: Secondary | ICD-10-CM | POA: Diagnosis not present

## 2023-11-14 DIAGNOSIS — J90 Pleural effusion, not elsewhere classified: Secondary | ICD-10-CM | POA: Diagnosis not present

## 2023-11-14 DIAGNOSIS — J9811 Atelectasis: Secondary | ICD-10-CM | POA: Diagnosis not present

## 2023-11-14 DIAGNOSIS — J189 Pneumonia, unspecified organism: Secondary | ICD-10-CM | POA: Diagnosis not present

## 2023-11-14 DIAGNOSIS — T82898A Other specified complication of vascular prosthetic devices, implants and grafts, initial encounter: Secondary | ICD-10-CM | POA: Diagnosis not present

## 2023-11-14 DIAGNOSIS — R9389 Abnormal findings on diagnostic imaging of other specified body structures: Secondary | ICD-10-CM | POA: Diagnosis not present

## 2023-11-14 DIAGNOSIS — R5381 Other malaise: Secondary | ICD-10-CM | POA: Diagnosis not present

## 2023-11-14 DIAGNOSIS — R279 Unspecified lack of coordination: Secondary | ICD-10-CM | POA: Diagnosis not present

## 2023-11-14 DIAGNOSIS — R519 Headache, unspecified: Secondary | ICD-10-CM | POA: Diagnosis not present

## 2023-11-14 DIAGNOSIS — T8509XA Other mechanical complication of ventricular intracranial (communicating) shunt, initial encounter: Secondary | ICD-10-CM | POA: Diagnosis not present

## 2023-11-14 DIAGNOSIS — R000 Tachycardia, unspecified: Secondary | ICD-10-CM | POA: Diagnosis not present

## 2023-11-14 DIAGNOSIS — R4182 Altered mental status, unspecified: Secondary | ICD-10-CM | POA: Diagnosis not present

## 2023-11-14 DIAGNOSIS — F99 Mental disorder, not otherwise specified: Secondary | ICD-10-CM | POA: Diagnosis not present

## 2023-11-14 DIAGNOSIS — G809 Cerebral palsy, unspecified: Secondary | ICD-10-CM | POA: Diagnosis present

## 2023-11-14 DIAGNOSIS — R001 Bradycardia, unspecified: Secondary | ICD-10-CM | POA: Diagnosis present

## 2023-11-14 DIAGNOSIS — H02883 Meibomian gland dysfunction of right eye, unspecified eyelid: Secondary | ICD-10-CM | POA: Diagnosis present

## 2023-11-14 DIAGNOSIS — Z1152 Encounter for screening for COVID-19: Secondary | ICD-10-CM | POA: Diagnosis not present

## 2023-11-14 DIAGNOSIS — H01006 Unspecified blepharitis left eye, unspecified eyelid: Secondary | ICD-10-CM | POA: Diagnosis present

## 2023-11-14 DIAGNOSIS — T8502XA Displacement of ventricular intracranial (communicating) shunt, initial encounter: Secondary | ICD-10-CM | POA: Diagnosis present

## 2023-11-14 DIAGNOSIS — H02886 Meibomian gland dysfunction of left eye, unspecified eyelid: Secondary | ICD-10-CM | POA: Diagnosis present

## 2023-11-14 DIAGNOSIS — R918 Other nonspecific abnormal finding of lung field: Secondary | ICD-10-CM | POA: Diagnosis not present

## 2023-11-14 DIAGNOSIS — Z9981 Dependence on supplemental oxygen: Secondary | ICD-10-CM | POA: Diagnosis not present

## 2023-11-14 DIAGNOSIS — H01003 Unspecified blepharitis right eye, unspecified eyelid: Secondary | ICD-10-CM | POA: Diagnosis present

## 2023-11-25 DIAGNOSIS — N400 Enlarged prostate without lower urinary tract symptoms: Secondary | ICD-10-CM | POA: Diagnosis not present

## 2023-11-25 DIAGNOSIS — E559 Vitamin D deficiency, unspecified: Secondary | ICD-10-CM | POA: Diagnosis not present

## 2023-11-25 DIAGNOSIS — Z982 Presence of cerebrospinal fluid drainage device: Secondary | ICD-10-CM | POA: Diagnosis not present

## 2023-11-25 DIAGNOSIS — G800 Spastic quadriplegic cerebral palsy: Secondary | ICD-10-CM | POA: Diagnosis not present

## 2023-11-25 DIAGNOSIS — K5900 Constipation, unspecified: Secondary | ICD-10-CM | POA: Diagnosis not present

## 2023-11-26 DIAGNOSIS — K5900 Constipation, unspecified: Secondary | ICD-10-CM | POA: Diagnosis not present

## 2023-11-26 DIAGNOSIS — N400 Enlarged prostate without lower urinary tract symptoms: Secondary | ICD-10-CM | POA: Diagnosis not present

## 2023-11-26 DIAGNOSIS — G800 Spastic quadriplegic cerebral palsy: Secondary | ICD-10-CM | POA: Diagnosis not present

## 2023-11-26 DIAGNOSIS — H6123 Impacted cerumen, bilateral: Secondary | ICD-10-CM | POA: Diagnosis not present

## 2023-11-28 DIAGNOSIS — E559 Vitamin D deficiency, unspecified: Secondary | ICD-10-CM | POA: Diagnosis not present

## 2023-11-28 DIAGNOSIS — G800 Spastic quadriplegic cerebral palsy: Secondary | ICD-10-CM | POA: Diagnosis not present

## 2023-11-28 DIAGNOSIS — K5900 Constipation, unspecified: Secondary | ICD-10-CM | POA: Diagnosis not present

## 2023-11-28 DIAGNOSIS — Z982 Presence of cerebrospinal fluid drainage device: Secondary | ICD-10-CM | POA: Diagnosis not present

## 2023-11-28 DIAGNOSIS — N400 Enlarged prostate without lower urinary tract symptoms: Secondary | ICD-10-CM | POA: Diagnosis not present

## 2023-12-02 DIAGNOSIS — Z09 Encounter for follow-up examination after completed treatment for conditions other than malignant neoplasm: Secondary | ICD-10-CM | POA: Diagnosis not present

## 2023-12-02 DIAGNOSIS — Z9889 Other specified postprocedural states: Secondary | ICD-10-CM | POA: Diagnosis not present

## 2023-12-02 DIAGNOSIS — H9201 Otalgia, right ear: Secondary | ICD-10-CM | POA: Diagnosis not present

## 2023-12-03 DIAGNOSIS — N400 Enlarged prostate without lower urinary tract symptoms: Secondary | ICD-10-CM | POA: Diagnosis not present

## 2023-12-03 DIAGNOSIS — H6123 Impacted cerumen, bilateral: Secondary | ICD-10-CM | POA: Diagnosis not present

## 2023-12-03 DIAGNOSIS — Z982 Presence of cerebrospinal fluid drainage device: Secondary | ICD-10-CM | POA: Diagnosis not present

## 2023-12-03 DIAGNOSIS — K5900 Constipation, unspecified: Secondary | ICD-10-CM | POA: Diagnosis not present

## 2023-12-05 DIAGNOSIS — Z48811 Encounter for surgical aftercare following surgery on the nervous system: Secondary | ICD-10-CM | POA: Diagnosis not present

## 2023-12-05 DIAGNOSIS — G800 Spastic quadriplegic cerebral palsy: Secondary | ICD-10-CM | POA: Diagnosis not present

## 2023-12-05 NOTE — Progress Notes (Signed)
 Subjective:  Patient ID: Bradley [name], male    DOB: Feb 21, 1976  Age: 48 y.o. MRN: [id number]  Chief Complaint  Patient presents with   Medical Management of Chronic Issues    Discussed the use of AI scribe software for clinical note transcription with the patient, who gave verbal consent to proceed.  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 The surgery was complicated by a Klebsiella pneumonia infection, suspected to be related to a CSF leak at the base of the shunt. The shunt was externalized and then repositioned to drain into the atrium. Following this, the patient had an extended hospitalization until 11/07/2023. He was discharged back to assisted living at [facility], but readmitted six days later due to shunt complications, with fluid pooling around the heart. The shunt was revised, and the patient was discharged after nine days around 11/22/2023.  Recently, the patient has been experiencing a decrease in appetite and a persistent headache for 3 days. Prior to the bowel obstruction, the patient had a good appetite, but since the second hospital discharge, the patient's food intake has significantly decreased. The patient denies any vomiting. The headache is described as being on top of the head, and the patient has also been complaining of ear discomfort. The patient's family has noticed the patient's eyes rolling back, a symptom previously associated with shunt problems.         08/10/2022    8:21 AM 04/20/2022   11:02 AM 03/26/2022   11:41 AM 01/24/2022   11:13 AM 01/02/2021    9:28 AM  Depression screen PHQ 2/9  Decreased Interest 0 0 0 0 0  Down, Depressed, Hopeless 0 0 0 0 0  PHQ - 2 Score 0 0 0 0 0        08/10/2022    8:22 AM  Fall Risk   Falls in the past year? 1  Comment usually controlled and is lowered to the floor  Number falls in past  yr: 1  Injury with Fall? 0    Patient Care Team: Blane Ohara, MD as PCP - General (Family Medicine)   Review of Systems  Constitutional:  Positive for appetite change. Negative for chills, diaphoresis and fever.  HENT:  Positive for congestion. Negative for ear pain and sore throat.   Respiratory:  Negative for cough and shortness of breath.   Cardiovascular:  Positive for chest pain (points to over his chest). Negative for leg swelling.  Gastrointestinal:  Negative for abdominal pain, constipation, diarrhea, nausea and vomiting.  Genitourinary:  Negative for dysuria and urgency.  Musculoskeletal:  Negative for arthralgias and myalgias.  Neurological:  Positive for headaches. Negative for dizziness.  Psychiatric/Behavioral:  Negative for dysphoric mood.     Current Outpatient Medications on File Prior to Visit  Medication Sig Dispense Refill   acetaminophen (TYLENOL) 500 MG tablet Take 500 mg by mouth every 6 (six) hours as needed for headache, fever, moderate pain or mild pain.     aspirin 81 MG chewable tablet Chew 81 mg by mouth daily.     baclofen (LIORESAL) 20 MG tablet Take 1 tablet (20 mg total) by mouth 4 (four) times daily. 360 each 1   chlorhexidine (PERIDEX) 0.12 % solution Use as directed 15 mLs in the mouth or throat 2 (two) times daily. (Patient taking differently: Use as directed 15 mLs in the  mouth or throat daily as needed.) 120 mL 5   docusate sodium (COLACE) 100 MG capsule Take 100 mg by mouth 2 (two) times daily.     LACTOBACILLUS RHAMNOSUS, GG, PO Take by mouth.     nystatin (MYCOSTATIN/NYSTOP) powder Apply 1 Application topically daily.     OxyCODONE HCl, Abuse Deter, (OXAYDO) 5 MG TABA Take 5 mg by mouth every 6 (six) hours as needed.     polyethylene glycol powder (GLYCOLAX/MIRALAX) 17 GM/SCOOP powder Take 34 g by mouth daily. 255 g 3   senna (SENOKOT) 8.6 MG TABS tablet Take 1 tablet (8.6 mg total) by mouth daily. 120 tablet 0   Sodium Fluoride (FLUORIDEX) 1.1  % PSTE Place onto teeth.     tamsulosin (FLOMAX) 0.4 MG CAPS capsule Take 0.4 mg by mouth.     Vitamin D, Ergocalciferol, (DRISDOL) 1.25 MG (50000 UNIT) CAPS capsule Take 1 capsule (50,000 Units total) by mouth every 7 (seven) days. 5 capsule 2   calcium carbonate (OS-CAL) 1250 (500 Ca) MG chewable tablet Chew 1 tablet by mouth.     Carboxymethylcellulose Sod PF 0.5 % SOLN Administer 1 drop to both eyes two (2) times a day.     cetirizine (ZYRTEC) 10 MG tablet Take 1 tablet by mouth daily.     No current facility-administered medications on file prior to visit.   Past Medical History:  Diagnosis Date   Allergy    Blood transfusion without reported diagnosis    Cerebral palsy, hemiplegic (HCC)    Chronic idiopathic constipation    Fracture    4 fractured riibs   Neuromuscular disorder (HCC)    Seizure disorder (HCC)    Past Surgical History:  Procedure Laterality Date   ADENOIDECTOMY     BOWEL RESECTION     CHOLECYSTECTOMY     cranial-peritoneal shunt     TONSILLECTOMY      Family History  Problem Relation Age of Onset   Diabetes Father    Hypertension Father    Hyperlipidemia Mother    Hypertension Mother    Ulcerative colitis Mother    Diabetes Mother    Migraines Mother    Osteoarthritis Mother    Diabetes Maternal Uncle    Prostate cancer Paternal Grandfather    Breast cancer Maternal Grandmother    Heart attack Maternal Grandmother    Diabetes Maternal Grandfather    Migraines Sister    Heart attack Maternal Great-grandfather    Cancer Other        Prostate and Breast   Colon cancer Neg Hx    Esophageal cancer Neg Hx    Rectal cancer Neg Hx    Stomach cancer Neg Hx    Social History   Socioeconomic History   Marital status: Single    Spouse name: Not on file   Number of children: 0   Years of education: Not on file   Highest education level: Not on file  Occupational History   Occupation: Disabled  Tobacco Use   Smoking status: Never   Smokeless  tobacco: Never  Vaping Use   Vaping status: Never Used  Substance and Sexual Activity   Alcohol use: Never   Drug use: Never   Sexual activity: Never  Other Topics Concern   Not on file  Social History Narrative   Not on file   Social Drivers of Health   Financial Resource Strain: Low Risk  (09/12/2023)   Received from [facility]   Overall Financial Resource  Strain (CARDIA)    Difficulty of Paying Living Expenses: Not very hard  Food Insecurity: No Food Insecurity (09/12/2023)   Received from [facility]   Hunger Vital Sign    Worried About Running Out of Food in the Last Year: Never true    Ran Out of Food in the Last Year: Never true  Transportation Needs: No Transportation Needs (12/06/2023)   PRAPARE - Administrator, Civil Service (Medical): No    Lack of Transportation (Non-Medical): No  Physical Activity: Inactive (01/24/2022)   Exercise Vital Sign    Days of Exercise per Week: 0 days    Minutes of Exercise per Session: 0 min  Stress: No Stress Concern Present (01/24/2022)   [employer] of Occupational Health - Occupational Stress Questionnaire    Feeling of Stress : Not at all  Social Connections: Unknown (01/24/2022)   Social Connection and Isolation Panel [NHANES]    Frequency of Communication with Friends and Family: Not on file    Frequency of Social Gatherings with Friends and Family: Not on file    Attends Religious Services: Not on Insurance claims handler of Clubs or Organizations: No    Attends Banker Meetings: Never    Marital Status: Never married    Objective:  BP (!) 150/88   Pulse 88   Temp 98.1 F (36.7 C)   Ht 5' (1.524 m)   Wt 114 lb (51.7 kg)   SpO2 95%   BMI 22.26 kg/m      12/06/2023   11:17 AM 04/25/2023    1:51 PM 03/29/2023   11:01 AM  BP/Weight  Systolic BP 150 115 104  Diastolic BP 88 80 72  Wt. (Lbs) 114 100 --  BMI 22.26 kg/m2 20.9 kg/m2     Physical Exam Vitals reviewed.   Constitutional:      Appearance: Normal appearance.  HENT:     Right Ear: There is impacted cerumen.     Left Ear: There is impacted cerumen.     Nose: Congestion present.     Right Turbinates: Enlarged and swollen.     Left Turbinates: Enlarged and swollen.     Comments: Yellow mucous.     Mouth/Throat:     Pharynx: No oropharyngeal exudate or posterior oropharyngeal erythema.  Neck:     Vascular: No carotid bruit.  Cardiovascular:     Rate and Rhythm: Normal rate and regular rhythm.     Heart sounds: Normal heart sounds.  Pulmonary:     Effort: Pulmonary effort is normal.     Breath sounds: Normal breath sounds. No wheezing, rhonchi or rales.  Abdominal:     Palpations: Abdomen is soft.     Tenderness: There is no abdominal tenderness.  Neurological:     Mental Status: He is alert.  Psychiatric:        Mood and Affect: Mood normal.        Behavior: Behavior normal.     Diabetic Foot Exam - Simple   No data filed      Lab Results  Component Value Date   WBC 5.2 03/05/2023   HGB 12.7 (L) 03/05/2023   HCT 36.7 (L) 03/05/2023   PLT 483 (H) 03/05/2023   GLUCOSE 99 03/05/2023   CHOL 166 07/12/2021   TRIG 47 07/12/2021   HDL 64 07/12/2021   LDLCALC 92 07/12/2021   ALT 25 03/05/2023   AST 20 03/05/2023   NA 135 03/05/2023  K 3.8 03/05/2023   CL 105 03/05/2023   CREATININE 0.35 (L) 03/05/2023   BUN 7 03/05/2023   CO2 21 (L) 03/05/2023   TSH 0.373 (L) 01/02/2021   INR 1.1 03/04/2023      Assessment & Plan:  Bilateral impacted cerumen Assessment & Plan: Successfully irrigated.    Decreased appetite Assessment & Plan: Unclear etiology.  Encourage oral intake.    New daily persistent headache Assessment & Plan: Concerning for shunt malfunction. Defer to neurosurgery, but I recommend be evaluated by malfunction due to headache atypical for sinusitis.   Acute non-recurrent maxillary sinusitis Assessment & Plan: Concerning for shunt  malfunction. Defer to neurosurgery, but I recommend be evaluated by malfunction due to headache atypical for sinusitis.  Orders: -     Amoxicillin; Take 1 capsule (500 mg total) by mouth 3 (three) times daily for 10 days.  Dispense: 30 capsule; Refill: 0  Brain ventricular shunt obstruction, subsequent encounter Assessment & Plan: Defer to neurosurgery, but I recommend evaluation at ED for possible malfunction.      Meds ordered this encounter  Medications   amoxicillin (AMOXIL) 500 MG capsule    Sig: Take 1 capsule (500 mg total) by mouth 3 (three) times daily for 10 days.    Dispense:  30 capsule    Refill:  0    No orders of the defined types were placed in this encounter.    Follow-up: Return in about 6 weeks (around 01/17/2024).  An After Visit Summary was printed and given to the patient.  Blane Ohara, MD [facility] [phone]

## 2023-12-06 ENCOUNTER — Encounter: Payer: Self-pay | Admitting: Family Medicine

## 2023-12-06 ENCOUNTER — Ambulatory Visit: Admitting: Family Medicine

## 2023-12-06 VITALS — BP 150/88 | HR 88 | Temp 98.1°F | Ht 60.0 in | Wt 114.0 lb

## 2023-12-06 DIAGNOSIS — J0100 Acute maxillary sinusitis, unspecified: Secondary | ICD-10-CM

## 2023-12-06 DIAGNOSIS — T8509XD Other mechanical complication of ventricular intracranial (communicating) shunt, subsequent encounter: Secondary | ICD-10-CM

## 2023-12-06 DIAGNOSIS — H6123 Impacted cerumen, bilateral: Secondary | ICD-10-CM

## 2023-12-06 DIAGNOSIS — R630 Anorexia: Secondary | ICD-10-CM | POA: Diagnosis not present

## 2023-12-06 DIAGNOSIS — G4452 New daily persistent headache (NDPH): Secondary | ICD-10-CM | POA: Diagnosis not present

## 2023-12-06 NOTE — Patient Instructions (Signed)
 VISIT SUMMARY:  During your visit, we discussed your recent decrease in appetite and persistent headaches following your hospital discharge. 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 We also addressed your current symptoms and concerns about potential shunt malfunction, sinus infection, and ear discomfort.  YOUR PLAN:  -CONCERNING FOR SHUNT MALFUNCTION - DECREASED APPETITE AND HEADACHE: Your decreased appetite and new headaches could be due to several reasons, including shunt malfunction or a sinus infection. I recommend deferring to neurosurgery, but if they do not hear back from the nurse, I would go to [facility] ED. I have prescribed antibiotics (amoxicillin) to treat sinus infection.  Ears cleaned and removed wax. If neurosurgery or the ED evaluates him and he receives adequate antibiotics to cover a sinus infection, may cancel oral amoxicillin order that I sent to the Skilled Nursing Facility.   -SINUS INFECTION: The presence of yellow nasal mucus suggests a possible sinus infection, which could be contributing to your symptoms. We have prescribed antibiotics to treat this infection.  -EARWAX ACCUMULATION: You have significant earwax accumulation, which may cause discomfort. We cleaned your ears to remove the wax, although it is unlikely to be the primary cause of your symptoms.  -GENERAL HEALTH MAINTENANCE: Given your extended hospitalization, it is important to monitor your recovery and prevent complications. Ensure you maintain a balanced diet, stay adequately hydrated, and have regular follow-ups with your neurosurgeon and other specialists as needed.

## 2023-12-08 ENCOUNTER — Encounter: Payer: Self-pay | Admitting: Family Medicine

## 2023-12-08 DIAGNOSIS — G4452 New daily persistent headache (NDPH): Secondary | ICD-10-CM | POA: Insufficient documentation

## 2023-12-08 DIAGNOSIS — R630 Anorexia: Secondary | ICD-10-CM | POA: Insufficient documentation

## 2023-12-08 DIAGNOSIS — T8509XD Other mechanical complication of ventricular intracranial (communicating) shunt, subsequent encounter: Secondary | ICD-10-CM | POA: Insufficient documentation

## 2023-12-08 DIAGNOSIS — J0100 Acute maxillary sinusitis, unspecified: Secondary | ICD-10-CM | POA: Insufficient documentation

## 2023-12-08 MED ORDER — AMOXICILLIN 500 MG PO CAPS: 500.0000 mg | ORAL_CAPSULE | Freq: Three times a day (TID) | ORAL | 0 refills | Status: AC

## 2023-12-08 NOTE — Assessment & Plan Note (Signed)
 Successfully irrigated.

## 2023-12-08 NOTE — Assessment & Plan Note (Signed)
 Defer to neurosurgery, but I recommend evaluation at ED for possible malfunction.

## 2023-12-08 NOTE — Assessment & Plan Note (Signed)
 Concerning for shunt malfunction. Defer to neurosurgery, but I recommend be evaluated by malfunction due to headache atypical for sinusitis.

## 2023-12-08 NOTE — Assessment & Plan Note (Signed)
 Unclear etiology.  Encourage oral intake.

## 2023-12-09 DIAGNOSIS — J811 Chronic pulmonary edema: Secondary | ICD-10-CM | POA: Diagnosis not present

## 2023-12-09 DIAGNOSIS — R578 Other shock: Secondary | ICD-10-CM | POA: Diagnosis not present

## 2023-12-09 DIAGNOSIS — R001 Bradycardia, unspecified: Secondary | ICD-10-CM | POA: Diagnosis not present

## 2023-12-09 DIAGNOSIS — R9431 Abnormal electrocardiogram [ECG] [EKG]: Secondary | ICD-10-CM | POA: Diagnosis not present

## 2023-12-09 DIAGNOSIS — R140 Abdominal distension (gaseous): Secondary | ICD-10-CM | POA: Diagnosis not present

## 2023-12-09 DIAGNOSIS — R339 Retention of urine, unspecified: Secondary | ICD-10-CM | POA: Diagnosis not present

## 2023-12-09 DIAGNOSIS — K592 Neurogenic bowel, not elsewhere classified: Secondary | ICD-10-CM | POA: Diagnosis not present

## 2023-12-09 DIAGNOSIS — Z7409 Other reduced mobility: Secondary | ICD-10-CM | POA: Diagnosis not present

## 2023-12-09 DIAGNOSIS — D72829 Elevated white blood cell count, unspecified: Secondary | ICD-10-CM | POA: Diagnosis not present

## 2023-12-09 DIAGNOSIS — K56609 Unspecified intestinal obstruction, unspecified as to partial versus complete obstruction: Secondary | ICD-10-CM | POA: Diagnosis not present

## 2023-12-09 DIAGNOSIS — G911 Obstructive hydrocephalus: Secondary | ICD-10-CM | POA: Diagnosis not present

## 2023-12-09 DIAGNOSIS — T8509XA Other mechanical complication of ventricular intracranial (communicating) shunt, initial encounter: Secondary | ICD-10-CM | POA: Diagnosis not present

## 2023-12-09 DIAGNOSIS — R000 Tachycardia, unspecified: Secondary | ICD-10-CM | POA: Diagnosis not present

## 2023-12-09 DIAGNOSIS — R918 Other nonspecific abnormal finding of lung field: Secondary | ICD-10-CM | POA: Diagnosis not present

## 2023-12-09 DIAGNOSIS — B957 Other staphylococcus as the cause of diseases classified elsewhere: Secondary | ICD-10-CM | POA: Diagnosis not present

## 2023-12-09 DIAGNOSIS — B9562 Methicillin resistant Staphylococcus aureus infection as the cause of diseases classified elsewhere: Secondary | ICD-10-CM | POA: Diagnosis not present

## 2023-12-09 DIAGNOSIS — A499 Bacterial infection, unspecified: Secondary | ICD-10-CM | POA: Diagnosis not present

## 2023-12-09 DIAGNOSIS — J984 Other disorders of lung: Secondary | ICD-10-CM | POA: Diagnosis not present

## 2023-12-09 DIAGNOSIS — I10 Essential (primary) hypertension: Secondary | ICD-10-CM | POA: Diagnosis not present

## 2023-12-09 DIAGNOSIS — D75839 Thrombocytosis, unspecified: Secondary | ICD-10-CM | POA: Diagnosis not present

## 2023-12-09 DIAGNOSIS — Z452 Encounter for adjustment and management of vascular access device: Secondary | ICD-10-CM | POA: Diagnosis not present

## 2023-12-09 DIAGNOSIS — T8501XA Breakdown (mechanical) of ventricular intracranial (communicating) shunt, initial encounter: Secondary | ICD-10-CM | POA: Diagnosis not present

## 2023-12-09 DIAGNOSIS — D649 Anemia, unspecified: Secondary | ICD-10-CM | POA: Diagnosis not present

## 2023-12-09 DIAGNOSIS — R9389 Abnormal findings on diagnostic imaging of other specified body structures: Secondary | ICD-10-CM | POA: Diagnosis not present

## 2023-12-09 DIAGNOSIS — R6250 Unspecified lack of expected normal physiological development in childhood: Secondary | ICD-10-CM | POA: Diagnosis not present

## 2023-12-09 DIAGNOSIS — T85618A Breakdown (mechanical) of other specified internal prosthetic devices, implants and grafts, initial encounter: Secondary | ICD-10-CM | POA: Diagnosis not present

## 2023-12-09 DIAGNOSIS — N400 Enlarged prostate without lower urinary tract symptoms: Secondary | ICD-10-CM | POA: Diagnosis not present

## 2023-12-09 DIAGNOSIS — I6201 Nontraumatic acute subdural hemorrhage: Secondary | ICD-10-CM | POA: Diagnosis not present

## 2023-12-09 DIAGNOSIS — R519 Headache, unspecified: Secondary | ICD-10-CM | POA: Diagnosis not present

## 2023-12-09 DIAGNOSIS — R1311 Dysphagia, oral phase: Secondary | ICD-10-CM | POA: Diagnosis not present

## 2023-12-09 DIAGNOSIS — M25552 Pain in left hip: Secondary | ICD-10-CM | POA: Diagnosis not present

## 2023-12-09 DIAGNOSIS — J15212 Pneumonia due to Methicillin resistant Staphylococcus aureus: Secondary | ICD-10-CM | POA: Diagnosis not present

## 2023-12-09 DIAGNOSIS — Z9889 Other specified postprocedural states: Secondary | ICD-10-CM | POA: Diagnosis not present

## 2023-12-09 DIAGNOSIS — R0902 Hypoxemia: Secondary | ICD-10-CM | POA: Diagnosis not present

## 2023-12-09 DIAGNOSIS — J151 Pneumonia due to Pseudomonas: Secondary | ICD-10-CM | POA: Diagnosis not present

## 2023-12-09 DIAGNOSIS — G918 Other hydrocephalus: Secondary | ICD-10-CM | POA: Diagnosis not present

## 2023-12-09 DIAGNOSIS — J439 Emphysema, unspecified: Secondary | ICD-10-CM | POA: Diagnosis not present

## 2023-12-09 DIAGNOSIS — R0689 Other abnormalities of breathing: Secondary | ICD-10-CM | POA: Diagnosis not present

## 2023-12-09 DIAGNOSIS — B961 Klebsiella pneumoniae [K. pneumoniae] as the cause of diseases classified elsewhere: Secondary | ICD-10-CM | POA: Diagnosis not present

## 2023-12-09 DIAGNOSIS — N3289 Other specified disorders of bladder: Secondary | ICD-10-CM | POA: Diagnosis not present

## 2023-12-09 DIAGNOSIS — K9189 Other postprocedural complications and disorders of digestive system: Secondary | ICD-10-CM | POA: Diagnosis not present

## 2023-12-09 DIAGNOSIS — E876 Hypokalemia: Secondary | ICD-10-CM | POA: Diagnosis not present

## 2023-12-09 DIAGNOSIS — R7881 Bacteremia: Secondary | ICD-10-CM | POA: Diagnosis not present

## 2023-12-09 DIAGNOSIS — Z982 Presence of cerebrospinal fluid drainage device: Secondary | ICD-10-CM | POA: Diagnosis not present

## 2023-12-09 DIAGNOSIS — B974 Respiratory syncytial virus as the cause of diseases classified elsewhere: Secondary | ICD-10-CM | POA: Diagnosis not present

## 2023-12-09 DIAGNOSIS — E441 Mild protein-calorie malnutrition: Secondary | ICD-10-CM | POA: Diagnosis not present

## 2023-12-09 DIAGNOSIS — R197 Diarrhea, unspecified: Secondary | ICD-10-CM | POA: Diagnosis not present

## 2023-12-09 DIAGNOSIS — G9389 Other specified disorders of brain: Secondary | ICD-10-CM | POA: Diagnosis not present

## 2023-12-09 DIAGNOSIS — I9581 Postprocedural hypotension: Secondary | ICD-10-CM | POA: Diagnosis not present

## 2023-12-09 DIAGNOSIS — Z4682 Encounter for fitting and adjustment of non-vascular catheter: Secondary | ICD-10-CM | POA: Diagnosis not present

## 2023-12-09 DIAGNOSIS — G9340 Encephalopathy, unspecified: Secondary | ICD-10-CM | POA: Diagnosis not present

## 2023-12-09 DIAGNOSIS — R109 Unspecified abdominal pain: Secondary | ICD-10-CM | POA: Diagnosis not present

## 2023-12-09 DIAGNOSIS — I499 Cardiac arrhythmia, unspecified: Secondary | ICD-10-CM | POA: Diagnosis not present

## 2023-12-09 DIAGNOSIS — R29810 Facial weakness: Secondary | ICD-10-CM | POA: Diagnosis not present

## 2023-12-09 DIAGNOSIS — K6389 Other specified diseases of intestine: Secondary | ICD-10-CM | POA: Diagnosis not present

## 2023-12-09 DIAGNOSIS — R0600 Dyspnea, unspecified: Secondary | ICD-10-CM | POA: Diagnosis not present

## 2023-12-09 DIAGNOSIS — K560 Paralytic ileus: Secondary | ICD-10-CM | POA: Diagnosis not present

## 2023-12-09 DIAGNOSIS — J90 Pleural effusion, not elsewhere classified: Secondary | ICD-10-CM | POA: Diagnosis not present

## 2023-12-09 DIAGNOSIS — R5381 Other malaise: Secondary | ICD-10-CM | POA: Diagnosis not present

## 2023-12-09 DIAGNOSIS — I6200 Nontraumatic subdural hemorrhage, unspecified: Secondary | ICD-10-CM | POA: Diagnosis not present

## 2023-12-09 DIAGNOSIS — D509 Iron deficiency anemia, unspecified: Secondary | ICD-10-CM | POA: Diagnosis not present

## 2023-12-09 DIAGNOSIS — G919 Hydrocephalus, unspecified: Secondary | ICD-10-CM | POA: Diagnosis not present

## 2023-12-09 DIAGNOSIS — M25551 Pain in right hip: Secondary | ICD-10-CM | POA: Diagnosis not present

## 2023-12-09 DIAGNOSIS — K567 Ileus, unspecified: Secondary | ICD-10-CM | POA: Diagnosis not present

## 2023-12-09 DIAGNOSIS — K5900 Constipation, unspecified: Secondary | ICD-10-CM | POA: Diagnosis not present

## 2023-12-09 DIAGNOSIS — Z9981 Dependence on supplemental oxygen: Secondary | ICD-10-CM | POA: Diagnosis not present

## 2023-12-09 DIAGNOSIS — G809 Cerebral palsy, unspecified: Secondary | ICD-10-CM | POA: Diagnosis not present

## 2023-12-09 DIAGNOSIS — R4781 Slurred speech: Secondary | ICD-10-CM | POA: Diagnosis not present

## 2023-12-09 DIAGNOSIS — R509 Fever, unspecified: Secondary | ICD-10-CM | POA: Diagnosis not present

## 2023-12-09 DIAGNOSIS — J690 Pneumonitis due to inhalation of food and vomit: Secondary | ICD-10-CM | POA: Diagnosis not present

## 2023-12-09 DIAGNOSIS — R569 Unspecified convulsions: Secondary | ICD-10-CM | POA: Diagnosis not present

## 2023-12-09 DIAGNOSIS — R0602 Shortness of breath: Secondary | ICD-10-CM | POA: Diagnosis not present

## 2023-12-09 DIAGNOSIS — G800 Spastic quadriplegic cerebral palsy: Secondary | ICD-10-CM | POA: Diagnosis not present

## 2023-12-09 DIAGNOSIS — E870 Hyperosmolality and hypernatremia: Secondary | ICD-10-CM | POA: Diagnosis not present

## 2023-12-09 DIAGNOSIS — I959 Hypotension, unspecified: Secondary | ICD-10-CM | POA: Diagnosis not present

## 2023-12-09 DIAGNOSIS — I472 Ventricular tachycardia, unspecified: Secondary | ICD-10-CM | POA: Diagnosis not present

## 2023-12-09 DIAGNOSIS — E538 Deficiency of other specified B group vitamins: Secondary | ICD-10-CM | POA: Diagnosis not present

## 2023-12-09 DIAGNOSIS — J9811 Atelectasis: Secondary | ICD-10-CM | POA: Diagnosis not present

## 2023-12-09 DIAGNOSIS — Z4541 Encounter for adjustment and management of cerebrospinal fluid drainage device: Secondary | ICD-10-CM | POA: Diagnosis not present

## 2023-12-09 DIAGNOSIS — R279 Unspecified lack of coordination: Secondary | ICD-10-CM | POA: Diagnosis not present

## 2023-12-09 DIAGNOSIS — T17920A Food in respiratory tract, part unspecified causing asphyxiation, initial encounter: Secondary | ICD-10-CM | POA: Diagnosis not present

## 2023-12-09 DIAGNOSIS — I82B11 Acute embolism and thrombosis of right subclavian vein: Secondary | ICD-10-CM | POA: Diagnosis not present

## 2023-12-09 DIAGNOSIS — J189 Pneumonia, unspecified organism: Secondary | ICD-10-CM | POA: Diagnosis not present

## 2023-12-09 DIAGNOSIS — R058 Other specified cough: Secondary | ICD-10-CM | POA: Diagnosis not present

## 2023-12-09 DIAGNOSIS — E873 Alkalosis: Secondary | ICD-10-CM | POA: Diagnosis not present

## 2023-12-09 DIAGNOSIS — Z1152 Encounter for screening for COVID-19: Secondary | ICD-10-CM | POA: Diagnosis not present

## 2023-12-10 DIAGNOSIS — Z9889 Other specified postprocedural states: Secondary | ICD-10-CM | POA: Diagnosis not present

## 2023-12-10 DIAGNOSIS — G911 Obstructive hydrocephalus: Secondary | ICD-10-CM | POA: Diagnosis not present

## 2023-12-10 DIAGNOSIS — G9340 Encephalopathy, unspecified: Secondary | ICD-10-CM | POA: Diagnosis not present

## 2023-12-10 DIAGNOSIS — T85618A Breakdown (mechanical) of other specified internal prosthetic devices, implants and grafts, initial encounter: Secondary | ICD-10-CM | POA: Diagnosis not present

## 2023-12-10 DIAGNOSIS — E876 Hypokalemia: Secondary | ICD-10-CM | POA: Diagnosis not present

## 2023-12-10 DIAGNOSIS — K56609 Unspecified intestinal obstruction, unspecified as to partial versus complete obstruction: Secondary | ICD-10-CM | POA: Diagnosis not present

## 2023-12-10 DIAGNOSIS — G919 Hydrocephalus, unspecified: Secondary | ICD-10-CM | POA: Diagnosis not present

## 2023-12-10 DIAGNOSIS — G9389 Other specified disorders of brain: Secondary | ICD-10-CM | POA: Diagnosis not present

## 2023-12-10 DIAGNOSIS — T8501XA Breakdown (mechanical) of ventricular intracranial (communicating) shunt, initial encounter: Secondary | ICD-10-CM | POA: Diagnosis not present

## 2023-12-10 DIAGNOSIS — G809 Cerebral palsy, unspecified: Secondary | ICD-10-CM | POA: Diagnosis not present

## 2023-12-10 DIAGNOSIS — Z982 Presence of cerebrospinal fluid drainage device: Secondary | ICD-10-CM | POA: Diagnosis not present

## 2023-12-10 DIAGNOSIS — T8509XA Other mechanical complication of ventricular intracranial (communicating) shunt, initial encounter: Secondary | ICD-10-CM | POA: Diagnosis not present

## 2023-12-10 DIAGNOSIS — I959 Hypotension, unspecified: Secondary | ICD-10-CM | POA: Diagnosis not present

## 2023-12-12 DIAGNOSIS — J9811 Atelectasis: Secondary | ICD-10-CM | POA: Diagnosis not present

## 2023-12-12 DIAGNOSIS — R109 Unspecified abdominal pain: Secondary | ICD-10-CM | POA: Diagnosis not present

## 2023-12-12 DIAGNOSIS — Z982 Presence of cerebrospinal fluid drainage device: Secondary | ICD-10-CM | POA: Diagnosis not present

## 2023-12-12 DIAGNOSIS — I6201 Nontraumatic acute subdural hemorrhage: Secondary | ICD-10-CM | POA: Diagnosis not present

## 2023-12-12 DIAGNOSIS — R9389 Abnormal findings on diagnostic imaging of other specified body structures: Secondary | ICD-10-CM | POA: Diagnosis not present

## 2023-12-12 DIAGNOSIS — J90 Pleural effusion, not elsewhere classified: Secondary | ICD-10-CM | POA: Diagnosis not present

## 2023-12-12 DIAGNOSIS — G9389 Other specified disorders of brain: Secondary | ICD-10-CM | POA: Diagnosis not present

## 2023-12-13 DIAGNOSIS — R519 Headache, unspecified: Secondary | ICD-10-CM | POA: Diagnosis not present

## 2023-12-13 DIAGNOSIS — R4781 Slurred speech: Secondary | ICD-10-CM | POA: Diagnosis not present

## 2023-12-14 DIAGNOSIS — I6201 Nontraumatic acute subdural hemorrhage: Secondary | ICD-10-CM | POA: Diagnosis not present

## 2023-12-15 DIAGNOSIS — K5900 Constipation, unspecified: Secondary | ICD-10-CM | POA: Diagnosis not present

## 2023-12-15 DIAGNOSIS — R9431 Abnormal electrocardiogram [ECG] [EKG]: Secondary | ICD-10-CM | POA: Diagnosis not present

## 2023-12-15 DIAGNOSIS — D72829 Elevated white blood cell count, unspecified: Secondary | ICD-10-CM | POA: Diagnosis not present

## 2023-12-15 DIAGNOSIS — D649 Anemia, unspecified: Secondary | ICD-10-CM | POA: Diagnosis not present

## 2023-12-15 DIAGNOSIS — Z982 Presence of cerebrospinal fluid drainage device: Secondary | ICD-10-CM | POA: Diagnosis not present

## 2023-12-15 DIAGNOSIS — R000 Tachycardia, unspecified: Secondary | ICD-10-CM | POA: Diagnosis not present

## 2023-12-15 DIAGNOSIS — I6200 Nontraumatic subdural hemorrhage, unspecified: Secondary | ICD-10-CM | POA: Diagnosis not present

## 2023-12-15 DIAGNOSIS — R519 Headache, unspecified: Secondary | ICD-10-CM | POA: Diagnosis not present

## 2023-12-15 DIAGNOSIS — G809 Cerebral palsy, unspecified: Secondary | ICD-10-CM | POA: Diagnosis not present

## 2023-12-15 DIAGNOSIS — D75839 Thrombocytosis, unspecified: Secondary | ICD-10-CM | POA: Diagnosis not present

## 2023-12-16 DIAGNOSIS — R109 Unspecified abdominal pain: Secondary | ICD-10-CM | POA: Diagnosis not present

## 2023-12-16 DIAGNOSIS — Z452 Encounter for adjustment and management of vascular access device: Secondary | ICD-10-CM | POA: Diagnosis not present

## 2023-12-16 DIAGNOSIS — K560 Paralytic ileus: Secondary | ICD-10-CM | POA: Diagnosis not present

## 2023-12-16 DIAGNOSIS — M25552 Pain in left hip: Secondary | ICD-10-CM | POA: Diagnosis not present

## 2023-12-16 DIAGNOSIS — G809 Cerebral palsy, unspecified: Secondary | ICD-10-CM | POA: Diagnosis not present

## 2023-12-16 DIAGNOSIS — R140 Abdominal distension (gaseous): Secondary | ICD-10-CM | POA: Diagnosis not present

## 2023-12-16 DIAGNOSIS — I6200 Nontraumatic subdural hemorrhage, unspecified: Secondary | ICD-10-CM | POA: Diagnosis not present

## 2023-12-16 DIAGNOSIS — R000 Tachycardia, unspecified: Secondary | ICD-10-CM | POA: Diagnosis not present

## 2023-12-16 DIAGNOSIS — M25551 Pain in right hip: Secondary | ICD-10-CM | POA: Diagnosis not present

## 2023-12-16 DIAGNOSIS — T8509XA Other mechanical complication of ventricular intracranial (communicating) shunt, initial encounter: Secondary | ICD-10-CM | POA: Diagnosis not present

## 2023-12-16 DIAGNOSIS — R519 Headache, unspecified: Secondary | ICD-10-CM | POA: Diagnosis not present

## 2023-12-16 DIAGNOSIS — Z4682 Encounter for fitting and adjustment of non-vascular catheter: Secondary | ICD-10-CM | POA: Diagnosis not present

## 2023-12-16 DIAGNOSIS — G9389 Other specified disorders of brain: Secondary | ICD-10-CM | POA: Diagnosis not present

## 2023-12-16 DIAGNOSIS — Z982 Presence of cerebrospinal fluid drainage device: Secondary | ICD-10-CM | POA: Diagnosis not present

## 2023-12-16 DIAGNOSIS — K6389 Other specified diseases of intestine: Secondary | ICD-10-CM | POA: Diagnosis not present

## 2023-12-17 DIAGNOSIS — R109 Unspecified abdominal pain: Secondary | ICD-10-CM | POA: Diagnosis not present

## 2023-12-17 DIAGNOSIS — J189 Pneumonia, unspecified organism: Secondary | ICD-10-CM | POA: Diagnosis not present

## 2023-12-17 DIAGNOSIS — R918 Other nonspecific abnormal finding of lung field: Secondary | ICD-10-CM | POA: Diagnosis not present

## 2023-12-17 DIAGNOSIS — Z4682 Encounter for fitting and adjustment of non-vascular catheter: Secondary | ICD-10-CM | POA: Diagnosis not present

## 2023-12-17 DIAGNOSIS — D75839 Thrombocytosis, unspecified: Secondary | ICD-10-CM | POA: Diagnosis not present

## 2023-12-17 DIAGNOSIS — N3289 Other specified disorders of bladder: Secondary | ICD-10-CM | POA: Diagnosis not present

## 2023-12-17 DIAGNOSIS — T8501XA Breakdown (mechanical) of ventricular intracranial (communicating) shunt, initial encounter: Secondary | ICD-10-CM | POA: Diagnosis not present

## 2023-12-17 DIAGNOSIS — R9389 Abnormal findings on diagnostic imaging of other specified body structures: Secondary | ICD-10-CM | POA: Diagnosis not present

## 2023-12-17 DIAGNOSIS — K6389 Other specified diseases of intestine: Secondary | ICD-10-CM | POA: Diagnosis not present

## 2023-12-17 DIAGNOSIS — I6200 Nontraumatic subdural hemorrhage, unspecified: Secondary | ICD-10-CM | POA: Diagnosis not present

## 2023-12-17 DIAGNOSIS — Z4541 Encounter for adjustment and management of cerebrospinal fluid drainage device: Secondary | ICD-10-CM | POA: Diagnosis not present

## 2023-12-17 DIAGNOSIS — R339 Retention of urine, unspecified: Secondary | ICD-10-CM | POA: Diagnosis not present

## 2023-12-17 DIAGNOSIS — G919 Hydrocephalus, unspecified: Secondary | ICD-10-CM | POA: Diagnosis not present

## 2023-12-17 DIAGNOSIS — R0902 Hypoxemia: Secondary | ICD-10-CM | POA: Diagnosis not present

## 2023-12-17 DIAGNOSIS — K567 Ileus, unspecified: Secondary | ICD-10-CM | POA: Diagnosis not present

## 2023-12-17 DIAGNOSIS — J90 Pleural effusion, not elsewhere classified: Secondary | ICD-10-CM | POA: Diagnosis not present

## 2023-12-17 DIAGNOSIS — R000 Tachycardia, unspecified: Secondary | ICD-10-CM | POA: Diagnosis not present

## 2023-12-17 DIAGNOSIS — Z9981 Dependence on supplemental oxygen: Secondary | ICD-10-CM | POA: Diagnosis not present

## 2023-12-17 DIAGNOSIS — Z7409 Other reduced mobility: Secondary | ICD-10-CM | POA: Diagnosis not present

## 2023-12-17 DIAGNOSIS — D72829 Elevated white blood cell count, unspecified: Secondary | ICD-10-CM | POA: Diagnosis not present

## 2023-12-17 DIAGNOSIS — G809 Cerebral palsy, unspecified: Secondary | ICD-10-CM | POA: Diagnosis not present

## 2023-12-17 DIAGNOSIS — R519 Headache, unspecified: Secondary | ICD-10-CM | POA: Diagnosis not present

## 2023-12-17 DIAGNOSIS — Z452 Encounter for adjustment and management of vascular access device: Secondary | ICD-10-CM | POA: Diagnosis not present

## 2023-12-17 DIAGNOSIS — J9811 Atelectasis: Secondary | ICD-10-CM | POA: Diagnosis not present

## 2023-12-18 DIAGNOSIS — I82B11 Acute embolism and thrombosis of right subclavian vein: Secondary | ICD-10-CM | POA: Diagnosis not present

## 2023-12-18 DIAGNOSIS — R569 Unspecified convulsions: Secondary | ICD-10-CM | POA: Diagnosis not present

## 2023-12-18 DIAGNOSIS — R519 Headache, unspecified: Secondary | ICD-10-CM | POA: Diagnosis not present

## 2023-12-18 DIAGNOSIS — Z4682 Encounter for fitting and adjustment of non-vascular catheter: Secondary | ICD-10-CM | POA: Diagnosis not present

## 2023-12-18 DIAGNOSIS — D75839 Thrombocytosis, unspecified: Secondary | ICD-10-CM | POA: Diagnosis not present

## 2023-12-18 DIAGNOSIS — I959 Hypotension, unspecified: Secondary | ICD-10-CM | POA: Diagnosis not present

## 2023-12-18 DIAGNOSIS — R000 Tachycardia, unspecified: Secondary | ICD-10-CM | POA: Diagnosis not present

## 2023-12-18 DIAGNOSIS — Z452 Encounter for adjustment and management of vascular access device: Secondary | ICD-10-CM | POA: Diagnosis not present

## 2023-12-18 DIAGNOSIS — R109 Unspecified abdominal pain: Secondary | ICD-10-CM | POA: Diagnosis not present

## 2023-12-18 DIAGNOSIS — K567 Ileus, unspecified: Secondary | ICD-10-CM | POA: Diagnosis not present

## 2023-12-18 DIAGNOSIS — G809 Cerebral palsy, unspecified: Secondary | ICD-10-CM | POA: Diagnosis not present

## 2023-12-18 DIAGNOSIS — D72829 Elevated white blood cell count, unspecified: Secondary | ICD-10-CM | POA: Diagnosis not present

## 2023-12-18 DIAGNOSIS — I6200 Nontraumatic subdural hemorrhage, unspecified: Secondary | ICD-10-CM | POA: Diagnosis not present

## 2023-12-18 DIAGNOSIS — Z982 Presence of cerebrospinal fluid drainage device: Secondary | ICD-10-CM | POA: Diagnosis not present

## 2023-12-18 DIAGNOSIS — Z9889 Other specified postprocedural states: Secondary | ICD-10-CM | POA: Diagnosis not present

## 2023-12-19 DIAGNOSIS — R918 Other nonspecific abnormal finding of lung field: Secondary | ICD-10-CM | POA: Diagnosis not present

## 2023-12-19 DIAGNOSIS — J9811 Atelectasis: Secondary | ICD-10-CM | POA: Diagnosis not present

## 2023-12-19 DIAGNOSIS — Z982 Presence of cerebrospinal fluid drainage device: Secondary | ICD-10-CM | POA: Diagnosis not present

## 2023-12-19 DIAGNOSIS — E873 Alkalosis: Secondary | ICD-10-CM | POA: Diagnosis not present

## 2023-12-19 DIAGNOSIS — R000 Tachycardia, unspecified: Secondary | ICD-10-CM | POA: Diagnosis not present

## 2023-12-19 DIAGNOSIS — Z452 Encounter for adjustment and management of vascular access device: Secondary | ICD-10-CM | POA: Diagnosis not present

## 2023-12-19 DIAGNOSIS — J90 Pleural effusion, not elsewhere classified: Secondary | ICD-10-CM | POA: Diagnosis not present

## 2023-12-19 DIAGNOSIS — B957 Other staphylococcus as the cause of diseases classified elsewhere: Secondary | ICD-10-CM | POA: Diagnosis not present

## 2023-12-19 DIAGNOSIS — D649 Anemia, unspecified: Secondary | ICD-10-CM | POA: Diagnosis not present

## 2023-12-19 DIAGNOSIS — J189 Pneumonia, unspecified organism: Secondary | ICD-10-CM | POA: Diagnosis not present

## 2023-12-19 DIAGNOSIS — R569 Unspecified convulsions: Secondary | ICD-10-CM | POA: Diagnosis not present

## 2023-12-19 DIAGNOSIS — R519 Headache, unspecified: Secondary | ICD-10-CM | POA: Diagnosis not present

## 2023-12-19 DIAGNOSIS — G809 Cerebral palsy, unspecified: Secondary | ICD-10-CM | POA: Diagnosis not present

## 2023-12-19 DIAGNOSIS — R7881 Bacteremia: Secondary | ICD-10-CM | POA: Diagnosis not present

## 2023-12-19 DIAGNOSIS — G918 Other hydrocephalus: Secondary | ICD-10-CM | POA: Diagnosis not present

## 2023-12-19 DIAGNOSIS — I6200 Nontraumatic subdural hemorrhage, unspecified: Secondary | ICD-10-CM | POA: Diagnosis not present

## 2023-12-19 DIAGNOSIS — R509 Fever, unspecified: Secondary | ICD-10-CM | POA: Diagnosis not present

## 2023-12-19 DIAGNOSIS — B9562 Methicillin resistant Staphylococcus aureus infection as the cause of diseases classified elsewhere: Secondary | ICD-10-CM | POA: Diagnosis not present

## 2023-12-19 DIAGNOSIS — D72829 Elevated white blood cell count, unspecified: Secondary | ICD-10-CM | POA: Diagnosis not present

## 2023-12-20 DIAGNOSIS — D72829 Elevated white blood cell count, unspecified: Secondary | ICD-10-CM | POA: Diagnosis not present

## 2023-12-20 DIAGNOSIS — R509 Fever, unspecified: Secondary | ICD-10-CM | POA: Diagnosis not present

## 2023-12-20 DIAGNOSIS — G9389 Other specified disorders of brain: Secondary | ICD-10-CM | POA: Diagnosis not present

## 2023-12-20 DIAGNOSIS — T8509XA Other mechanical complication of ventricular intracranial (communicating) shunt, initial encounter: Secondary | ICD-10-CM | POA: Diagnosis not present

## 2023-12-20 DIAGNOSIS — Z982 Presence of cerebrospinal fluid drainage device: Secondary | ICD-10-CM | POA: Diagnosis not present

## 2023-12-20 DIAGNOSIS — J189 Pneumonia, unspecified organism: Secondary | ICD-10-CM | POA: Diagnosis not present

## 2023-12-20 DIAGNOSIS — I6200 Nontraumatic subdural hemorrhage, unspecified: Secondary | ICD-10-CM | POA: Diagnosis not present

## 2023-12-20 DIAGNOSIS — B957 Other staphylococcus as the cause of diseases classified elsewhere: Secondary | ICD-10-CM | POA: Diagnosis not present

## 2023-12-20 DIAGNOSIS — R001 Bradycardia, unspecified: Secondary | ICD-10-CM | POA: Diagnosis not present

## 2023-12-21 DIAGNOSIS — Z4682 Encounter for fitting and adjustment of non-vascular catheter: Secondary | ICD-10-CM | POA: Diagnosis not present

## 2023-12-21 DIAGNOSIS — R000 Tachycardia, unspecified: Secondary | ICD-10-CM | POA: Diagnosis not present

## 2023-12-21 DIAGNOSIS — G9389 Other specified disorders of brain: Secondary | ICD-10-CM | POA: Diagnosis not present

## 2023-12-21 DIAGNOSIS — B957 Other staphylococcus as the cause of diseases classified elsewhere: Secondary | ICD-10-CM | POA: Diagnosis not present

## 2023-12-21 DIAGNOSIS — R918 Other nonspecific abnormal finding of lung field: Secondary | ICD-10-CM | POA: Diagnosis not present

## 2023-12-21 DIAGNOSIS — D72829 Elevated white blood cell count, unspecified: Secondary | ICD-10-CM | POA: Diagnosis not present

## 2023-12-21 DIAGNOSIS — T8509XA Other mechanical complication of ventricular intracranial (communicating) shunt, initial encounter: Secondary | ICD-10-CM | POA: Diagnosis not present

## 2023-12-21 DIAGNOSIS — K5900 Constipation, unspecified: Secondary | ICD-10-CM | POA: Diagnosis not present

## 2023-12-21 DIAGNOSIS — R509 Fever, unspecified: Secondary | ICD-10-CM | POA: Diagnosis not present

## 2023-12-21 DIAGNOSIS — R109 Unspecified abdominal pain: Secondary | ICD-10-CM | POA: Diagnosis not present

## 2023-12-21 DIAGNOSIS — Z982 Presence of cerebrospinal fluid drainage device: Secondary | ICD-10-CM | POA: Diagnosis not present

## 2023-12-21 DIAGNOSIS — J189 Pneumonia, unspecified organism: Secondary | ICD-10-CM | POA: Diagnosis not present

## 2023-12-21 DIAGNOSIS — J151 Pneumonia due to Pseudomonas: Secondary | ICD-10-CM | POA: Diagnosis not present

## 2023-12-21 DIAGNOSIS — I6200 Nontraumatic subdural hemorrhage, unspecified: Secondary | ICD-10-CM | POA: Diagnosis not present

## 2023-12-21 DIAGNOSIS — G9340 Encephalopathy, unspecified: Secondary | ICD-10-CM | POA: Diagnosis not present

## 2023-12-21 DIAGNOSIS — R0600 Dyspnea, unspecified: Secondary | ICD-10-CM | POA: Diagnosis not present

## 2023-12-21 DIAGNOSIS — R0689 Other abnormalities of breathing: Secondary | ICD-10-CM | POA: Diagnosis not present

## 2023-12-22 DIAGNOSIS — J189 Pneumonia, unspecified organism: Secondary | ICD-10-CM | POA: Diagnosis not present

## 2023-12-22 DIAGNOSIS — Z9981 Dependence on supplemental oxygen: Secondary | ICD-10-CM | POA: Diagnosis not present

## 2023-12-22 DIAGNOSIS — Z982 Presence of cerebrospinal fluid drainage device: Secondary | ICD-10-CM | POA: Diagnosis not present

## 2023-12-22 DIAGNOSIS — Z9889 Other specified postprocedural states: Secondary | ICD-10-CM | POA: Diagnosis not present

## 2023-12-22 DIAGNOSIS — K567 Ileus, unspecified: Secondary | ICD-10-CM | POA: Diagnosis not present

## 2023-12-22 DIAGNOSIS — I6200 Nontraumatic subdural hemorrhage, unspecified: Secondary | ICD-10-CM | POA: Diagnosis not present

## 2023-12-22 DIAGNOSIS — B957 Other staphylococcus as the cause of diseases classified elsewhere: Secondary | ICD-10-CM | POA: Diagnosis not present

## 2023-12-22 DIAGNOSIS — G9389 Other specified disorders of brain: Secondary | ICD-10-CM | POA: Diagnosis not present

## 2023-12-22 DIAGNOSIS — R000 Tachycardia, unspecified: Secondary | ICD-10-CM | POA: Diagnosis not present

## 2023-12-22 DIAGNOSIS — D72829 Elevated white blood cell count, unspecified: Secondary | ICD-10-CM | POA: Diagnosis not present

## 2023-12-22 DIAGNOSIS — T8509XA Other mechanical complication of ventricular intracranial (communicating) shunt, initial encounter: Secondary | ICD-10-CM | POA: Diagnosis not present

## 2023-12-22 DIAGNOSIS — R509 Fever, unspecified: Secondary | ICD-10-CM | POA: Diagnosis not present

## 2023-12-23 DIAGNOSIS — R197 Diarrhea, unspecified: Secondary | ICD-10-CM | POA: Diagnosis not present

## 2023-12-23 DIAGNOSIS — R509 Fever, unspecified: Secondary | ICD-10-CM | POA: Diagnosis not present

## 2023-12-23 DIAGNOSIS — B957 Other staphylococcus as the cause of diseases classified elsewhere: Secondary | ICD-10-CM | POA: Diagnosis not present

## 2023-12-23 DIAGNOSIS — Z9889 Other specified postprocedural states: Secondary | ICD-10-CM | POA: Diagnosis not present

## 2023-12-23 DIAGNOSIS — T8509XA Other mechanical complication of ventricular intracranial (communicating) shunt, initial encounter: Secondary | ICD-10-CM | POA: Diagnosis not present

## 2023-12-23 DIAGNOSIS — B961 Klebsiella pneumoniae [K. pneumoniae] as the cause of diseases classified elsewhere: Secondary | ICD-10-CM | POA: Diagnosis not present

## 2023-12-23 DIAGNOSIS — I6200 Nontraumatic subdural hemorrhage, unspecified: Secondary | ICD-10-CM | POA: Diagnosis not present

## 2023-12-23 DIAGNOSIS — Z982 Presence of cerebrospinal fluid drainage device: Secondary | ICD-10-CM | POA: Diagnosis not present

## 2023-12-23 DIAGNOSIS — J189 Pneumonia, unspecified organism: Secondary | ICD-10-CM | POA: Diagnosis not present

## 2023-12-23 DIAGNOSIS — R918 Other nonspecific abnormal finding of lung field: Secondary | ICD-10-CM | POA: Diagnosis not present

## 2023-12-23 DIAGNOSIS — R7881 Bacteremia: Secondary | ICD-10-CM | POA: Diagnosis not present

## 2023-12-23 DIAGNOSIS — R0902 Hypoxemia: Secondary | ICD-10-CM | POA: Diagnosis not present

## 2023-12-23 DIAGNOSIS — R000 Tachycardia, unspecified: Secondary | ICD-10-CM | POA: Diagnosis not present

## 2023-12-23 DIAGNOSIS — D72829 Elevated white blood cell count, unspecified: Secondary | ICD-10-CM | POA: Diagnosis not present

## 2023-12-24 DIAGNOSIS — R000 Tachycardia, unspecified: Secondary | ICD-10-CM | POA: Diagnosis not present

## 2023-12-25 DIAGNOSIS — R000 Tachycardia, unspecified: Secondary | ICD-10-CM | POA: Diagnosis not present

## 2023-12-25 DIAGNOSIS — J15212 Pneumonia due to Methicillin resistant Staphylococcus aureus: Secondary | ICD-10-CM | POA: Diagnosis not present

## 2023-12-25 DIAGNOSIS — I6200 Nontraumatic subdural hemorrhage, unspecified: Secondary | ICD-10-CM | POA: Diagnosis not present

## 2023-12-25 DIAGNOSIS — Z982 Presence of cerebrospinal fluid drainage device: Secondary | ICD-10-CM | POA: Diagnosis not present

## 2023-12-25 DIAGNOSIS — K5900 Constipation, unspecified: Secondary | ICD-10-CM | POA: Diagnosis not present

## 2023-12-26 DIAGNOSIS — I6200 Nontraumatic subdural hemorrhage, unspecified: Secondary | ICD-10-CM | POA: Diagnosis not present

## 2023-12-26 DIAGNOSIS — Z982 Presence of cerebrospinal fluid drainage device: Secondary | ICD-10-CM | POA: Diagnosis not present

## 2023-12-29 DIAGNOSIS — I6200 Nontraumatic subdural hemorrhage, unspecified: Secondary | ICD-10-CM | POA: Diagnosis not present

## 2023-12-29 DIAGNOSIS — J189 Pneumonia, unspecified organism: Secondary | ICD-10-CM | POA: Diagnosis not present

## 2023-12-29 DIAGNOSIS — J984 Other disorders of lung: Secondary | ICD-10-CM | POA: Diagnosis not present

## 2023-12-29 DIAGNOSIS — J90 Pleural effusion, not elsewhere classified: Secondary | ICD-10-CM | POA: Diagnosis not present

## 2023-12-29 DIAGNOSIS — G9389 Other specified disorders of brain: Secondary | ICD-10-CM | POA: Diagnosis not present

## 2023-12-30 DIAGNOSIS — J15212 Pneumonia due to Methicillin resistant Staphylococcus aureus: Secondary | ICD-10-CM | POA: Diagnosis not present

## 2023-12-30 DIAGNOSIS — J90 Pleural effusion, not elsewhere classified: Secondary | ICD-10-CM | POA: Diagnosis not present

## 2023-12-30 DIAGNOSIS — J189 Pneumonia, unspecified organism: Secondary | ICD-10-CM | POA: Diagnosis not present

## 2023-12-30 DIAGNOSIS — B957 Other staphylococcus as the cause of diseases classified elsewhere: Secondary | ICD-10-CM | POA: Diagnosis not present

## 2023-12-30 DIAGNOSIS — Z9981 Dependence on supplemental oxygen: Secondary | ICD-10-CM | POA: Diagnosis not present

## 2023-12-30 DIAGNOSIS — R058 Other specified cough: Secondary | ICD-10-CM | POA: Diagnosis not present

## 2023-12-31 DIAGNOSIS — J15212 Pneumonia due to Methicillin resistant Staphylococcus aureus: Secondary | ICD-10-CM | POA: Diagnosis not present

## 2023-12-31 DIAGNOSIS — R1311 Dysphagia, oral phase: Secondary | ICD-10-CM | POA: Diagnosis not present

## 2023-12-31 DIAGNOSIS — B974 Respiratory syncytial virus as the cause of diseases classified elsewhere: Secondary | ICD-10-CM | POA: Diagnosis not present

## 2023-12-31 DIAGNOSIS — R058 Other specified cough: Secondary | ICD-10-CM | POA: Diagnosis not present

## 2023-12-31 DIAGNOSIS — T17920A Food in respiratory tract, part unspecified causing asphyxiation, initial encounter: Secondary | ICD-10-CM | POA: Diagnosis not present

## 2023-12-31 DIAGNOSIS — B957 Other staphylococcus as the cause of diseases classified elsewhere: Secondary | ICD-10-CM | POA: Diagnosis not present

## 2024-01-06 ENCOUNTER — Telehealth: Payer: Self-pay

## 2024-01-06 DIAGNOSIS — K560 Paralytic ileus: Secondary | ICD-10-CM | POA: Diagnosis not present

## 2024-01-06 NOTE — Telephone Encounter (Signed)
 Copied from CRM [id number]. Topic: General - Other >> Dec 31, 2023  1:12 PM Bradley [name] wrote: Reason for CRM: Patient requesting a callback at # [phone]. Refused to provide any additional information.

## 2024-01-06 NOTE — Telephone Encounter (Signed)
 LM for the patient to call the office back.

## 2024-01-08 DIAGNOSIS — R0602 Shortness of breath: Secondary | ICD-10-CM | POA: Diagnosis not present

## 2024-01-08 DIAGNOSIS — J811 Chronic pulmonary edema: Secondary | ICD-10-CM | POA: Diagnosis not present

## 2024-01-09 DIAGNOSIS — R0602 Shortness of breath: Secondary | ICD-10-CM | POA: Diagnosis not present

## 2024-01-09 DIAGNOSIS — G809 Cerebral palsy, unspecified: Secondary | ICD-10-CM | POA: Diagnosis not present

## 2024-01-09 DIAGNOSIS — J9811 Atelectasis: Secondary | ICD-10-CM | POA: Diagnosis not present

## 2024-01-09 DIAGNOSIS — R0902 Hypoxemia: Secondary | ICD-10-CM | POA: Diagnosis not present

## 2024-01-09 DIAGNOSIS — J90 Pleural effusion, not elsewhere classified: Secondary | ICD-10-CM | POA: Diagnosis not present

## 2024-01-10 DIAGNOSIS — J439 Emphysema, unspecified: Secondary | ICD-10-CM | POA: Diagnosis not present

## 2024-01-10 DIAGNOSIS — R279 Unspecified lack of coordination: Secondary | ICD-10-CM | POA: Diagnosis not present

## 2024-01-10 DIAGNOSIS — R5381 Other malaise: Secondary | ICD-10-CM | POA: Diagnosis not present

## 2024-01-10 DIAGNOSIS — G809 Cerebral palsy, unspecified: Secondary | ICD-10-CM | POA: Diagnosis not present

## 2024-01-10 DIAGNOSIS — J9811 Atelectasis: Secondary | ICD-10-CM | POA: Diagnosis not present

## 2024-01-10 DIAGNOSIS — A499 Bacterial infection, unspecified: Secondary | ICD-10-CM | POA: Diagnosis not present

## 2024-01-10 DIAGNOSIS — Z982 Presence of cerebrospinal fluid drainage device: Secondary | ICD-10-CM | POA: Diagnosis not present

## 2024-01-10 DIAGNOSIS — R0902 Hypoxemia: Secondary | ICD-10-CM | POA: Diagnosis not present

## 2024-01-10 DIAGNOSIS — J90 Pleural effusion, not elsewhere classified: Secondary | ICD-10-CM | POA: Diagnosis not present

## 2024-01-13 ENCOUNTER — Telehealth: Payer: Self-pay | Admitting: Family Medicine

## 2024-01-13 DIAGNOSIS — K592 Neurogenic bowel, not elsewhere classified: Secondary | ICD-10-CM | POA: Diagnosis not present

## 2024-01-13 DIAGNOSIS — J9611 Chronic respiratory failure with hypoxia: Secondary | ICD-10-CM | POA: Diagnosis not present

## 2024-01-13 DIAGNOSIS — D509 Iron deficiency anemia, unspecified: Secondary | ICD-10-CM | POA: Diagnosis not present

## 2024-01-13 DIAGNOSIS — J210 Acute bronchiolitis due to respiratory syncytial virus: Secondary | ICD-10-CM | POA: Diagnosis not present

## 2024-01-13 DIAGNOSIS — T17908A Unspecified foreign body in respiratory tract, part unspecified causing other injury, initial encounter: Secondary | ICD-10-CM | POA: Diagnosis not present

## 2024-01-13 DIAGNOSIS — Z9981 Dependence on supplemental oxygen: Secondary | ICD-10-CM | POA: Diagnosis not present

## 2024-01-13 DIAGNOSIS — J15212 Pneumonia due to Methicillin resistant Staphylococcus aureus: Secondary | ICD-10-CM | POA: Diagnosis not present

## 2024-01-13 DIAGNOSIS — N400 Enlarged prostate without lower urinary tract symptoms: Secondary | ICD-10-CM | POA: Diagnosis not present

## 2024-01-13 NOTE — Telephone Encounter (Signed)
 Spoke with patient's mother and she explained that Luc will be seeing provider at the facility he is living at instead of Dr Cox.  Insurance will not cover both.    Thank you,  Trevor Fudge,  AMB Clinical Support [facility] AWV Program Direct Dial ??[phone]

## 2024-01-14 DIAGNOSIS — Z66 Do not resuscitate: Secondary | ICD-10-CM | POA: Diagnosis not present

## 2024-01-14 DIAGNOSIS — R319 Hematuria, unspecified: Secondary | ICD-10-CM | POA: Diagnosis not present

## 2024-01-14 DIAGNOSIS — R638 Other symptoms and signs concerning food and fluid intake: Secondary | ICD-10-CM | POA: Diagnosis not present

## 2024-01-14 DIAGNOSIS — G9341 Metabolic encephalopathy: Secondary | ICD-10-CM | POA: Diagnosis not present

## 2024-01-14 DIAGNOSIS — N189 Chronic kidney disease, unspecified: Secondary | ICD-10-CM | POA: Diagnosis not present

## 2024-01-14 DIAGNOSIS — M62831 Muscle spasm of calf: Secondary | ICD-10-CM | POA: Diagnosis not present

## 2024-01-14 DIAGNOSIS — J9811 Atelectasis: Secondary | ICD-10-CM | POA: Diagnosis not present

## 2024-01-14 DIAGNOSIS — G801 Spastic diplegic cerebral palsy: Secondary | ICD-10-CM | POA: Diagnosis not present

## 2024-01-14 DIAGNOSIS — R197 Diarrhea, unspecified: Secondary | ICD-10-CM | POA: Diagnosis not present

## 2024-01-14 DIAGNOSIS — J984 Other disorders of lung: Secondary | ICD-10-CM | POA: Diagnosis not present

## 2024-01-14 DIAGNOSIS — R109 Unspecified abdominal pain: Secondary | ICD-10-CM | POA: Diagnosis not present

## 2024-01-14 DIAGNOSIS — I959 Hypotension, unspecified: Secondary | ICD-10-CM | POA: Diagnosis not present

## 2024-01-14 DIAGNOSIS — G809 Cerebral palsy, unspecified: Secondary | ICD-10-CM | POA: Diagnosis not present

## 2024-01-14 DIAGNOSIS — R279 Unspecified lack of coordination: Secondary | ICD-10-CM | POA: Diagnosis not present

## 2024-01-14 DIAGNOSIS — R0602 Shortness of breath: Secondary | ICD-10-CM | POA: Diagnosis not present

## 2024-01-14 DIAGNOSIS — B957 Other staphylococcus as the cause of diseases classified elsewhere: Secondary | ICD-10-CM | POA: Diagnosis not present

## 2024-01-14 DIAGNOSIS — B9562 Methicillin resistant Staphylococcus aureus infection as the cause of diseases classified elsewhere: Secondary | ICD-10-CM | POA: Diagnosis not present

## 2024-01-14 DIAGNOSIS — H109 Unspecified conjunctivitis: Secondary | ICD-10-CM | POA: Diagnosis not present

## 2024-01-14 DIAGNOSIS — G9340 Encephalopathy, unspecified: Secondary | ICD-10-CM | POA: Diagnosis not present

## 2024-01-14 DIAGNOSIS — M6281 Muscle weakness (generalized): Secondary | ICD-10-CM | POA: Diagnosis not present

## 2024-01-14 DIAGNOSIS — Z0389 Encounter for observation for other suspected diseases and conditions ruled out: Secondary | ICD-10-CM | POA: Diagnosis not present

## 2024-01-14 DIAGNOSIS — R0689 Other abnormalities of breathing: Secondary | ICD-10-CM | POA: Diagnosis not present

## 2024-01-14 DIAGNOSIS — I340 Nonrheumatic mitral (valve) insufficiency: Secondary | ICD-10-CM | POA: Diagnosis not present

## 2024-01-14 DIAGNOSIS — R918 Other nonspecific abnormal finding of lung field: Secondary | ICD-10-CM | POA: Diagnosis not present

## 2024-01-14 DIAGNOSIS — R000 Tachycardia, unspecified: Secondary | ICD-10-CM | POA: Diagnosis not present

## 2024-01-14 DIAGNOSIS — R7881 Bacteremia: Secondary | ICD-10-CM | POA: Diagnosis not present

## 2024-01-14 DIAGNOSIS — Z952 Presence of prosthetic heart valve: Secondary | ICD-10-CM | POA: Diagnosis not present

## 2024-01-14 DIAGNOSIS — G9389 Other specified disorders of brain: Secondary | ICD-10-CM | POA: Diagnosis not present

## 2024-01-14 DIAGNOSIS — R0603 Acute respiratory distress: Secondary | ICD-10-CM | POA: Diagnosis not present

## 2024-01-14 DIAGNOSIS — R6521 Severe sepsis with septic shock: Secondary | ICD-10-CM | POA: Diagnosis not present

## 2024-01-14 DIAGNOSIS — G800 Spastic quadriplegic cerebral palsy: Secondary | ICD-10-CM | POA: Diagnosis not present

## 2024-01-14 DIAGNOSIS — E876 Hypokalemia: Secondary | ICD-10-CM | POA: Diagnosis not present

## 2024-01-14 DIAGNOSIS — N401 Enlarged prostate with lower urinary tract symptoms: Secondary | ICD-10-CM | POA: Diagnosis not present

## 2024-01-14 DIAGNOSIS — A4902 Methicillin resistant Staphylococcus aureus infection, unspecified site: Secondary | ICD-10-CM | POA: Diagnosis not present

## 2024-01-14 DIAGNOSIS — Z452 Encounter for adjustment and management of vascular access device: Secondary | ICD-10-CM | POA: Diagnosis not present

## 2024-01-14 DIAGNOSIS — B965 Pseudomonas (aeruginosa) (mallei) (pseudomallei) as the cause of diseases classified elsewhere: Secondary | ICD-10-CM | POA: Diagnosis not present

## 2024-01-14 DIAGNOSIS — G928 Other toxic encephalopathy: Secondary | ICD-10-CM | POA: Diagnosis not present

## 2024-01-14 DIAGNOSIS — I10 Essential (primary) hypertension: Secondary | ICD-10-CM | POA: Diagnosis not present

## 2024-01-14 DIAGNOSIS — Z982 Presence of cerebrospinal fluid drainage device: Secondary | ICD-10-CM | POA: Diagnosis not present

## 2024-01-14 DIAGNOSIS — M79604 Pain in right leg: Secondary | ICD-10-CM | POA: Diagnosis not present

## 2024-01-14 DIAGNOSIS — E870 Hyperosmolality and hypernatremia: Secondary | ICD-10-CM | POA: Diagnosis not present

## 2024-01-14 DIAGNOSIS — J9602 Acute respiratory failure with hypercapnia: Secondary | ICD-10-CM | POA: Diagnosis not present

## 2024-01-14 DIAGNOSIS — N400 Enlarged prostate without lower urinary tract symptoms: Secondary | ICD-10-CM | POA: Diagnosis not present

## 2024-01-14 DIAGNOSIS — K592 Neurogenic bowel, not elsewhere classified: Secondary | ICD-10-CM | POA: Diagnosis not present

## 2024-01-14 DIAGNOSIS — Z9049 Acquired absence of other specified parts of digestive tract: Secondary | ICD-10-CM | POA: Diagnosis not present

## 2024-01-14 DIAGNOSIS — R0789 Other chest pain: Secondary | ICD-10-CM | POA: Diagnosis not present

## 2024-01-14 DIAGNOSIS — Z515 Encounter for palliative care: Secondary | ICD-10-CM | POA: Diagnosis not present

## 2024-01-14 DIAGNOSIS — J9601 Acute respiratory failure with hypoxia: Secondary | ICD-10-CM | POA: Diagnosis not present

## 2024-01-14 DIAGNOSIS — M79606 Pain in leg, unspecified: Secondary | ICD-10-CM | POA: Diagnosis not present

## 2024-01-14 DIAGNOSIS — R0902 Hypoxemia: Secondary | ICD-10-CM | POA: Diagnosis not present

## 2024-01-14 DIAGNOSIS — Z4682 Encounter for fitting and adjustment of non-vascular catheter: Secondary | ICD-10-CM | POA: Diagnosis not present

## 2024-01-14 DIAGNOSIS — M549 Dorsalgia, unspecified: Secondary | ICD-10-CM | POA: Diagnosis not present

## 2024-01-14 DIAGNOSIS — R5381 Other malaise: Secondary | ICD-10-CM | POA: Diagnosis not present

## 2024-01-14 DIAGNOSIS — Z981 Arthrodesis status: Secondary | ICD-10-CM | POA: Diagnosis not present

## 2024-01-14 DIAGNOSIS — B964 Proteus (mirabilis) (morganii) as the cause of diseases classified elsewhere: Secondary | ICD-10-CM | POA: Diagnosis not present

## 2024-01-14 DIAGNOSIS — R4182 Altered mental status, unspecified: Secondary | ICD-10-CM | POA: Diagnosis not present

## 2024-01-14 DIAGNOSIS — F05 Delirium due to known physiological condition: Secondary | ICD-10-CM | POA: Diagnosis not present

## 2024-01-14 DIAGNOSIS — M79605 Pain in left leg: Secondary | ICD-10-CM | POA: Diagnosis not present

## 2024-01-14 DIAGNOSIS — N10 Acute tubulo-interstitial nephritis: Secondary | ICD-10-CM | POA: Diagnosis not present

## 2024-01-14 DIAGNOSIS — D638 Anemia in other chronic diseases classified elsewhere: Secondary | ICD-10-CM | POA: Diagnosis not present

## 2024-01-14 DIAGNOSIS — J690 Pneumonitis due to inhalation of food and vomit: Secondary | ICD-10-CM | POA: Diagnosis not present

## 2024-01-14 DIAGNOSIS — N179 Acute kidney failure, unspecified: Secondary | ICD-10-CM | POA: Diagnosis not present

## 2024-01-14 DIAGNOSIS — A411 Sepsis due to other specified staphylococcus: Secondary | ICD-10-CM | POA: Diagnosis not present

## 2024-01-14 DIAGNOSIS — M62830 Muscle spasm of back: Secondary | ICD-10-CM | POA: Diagnosis not present

## 2024-01-14 DIAGNOSIS — E43 Unspecified severe protein-calorie malnutrition: Secondary | ICD-10-CM | POA: Diagnosis not present

## 2024-01-14 DIAGNOSIS — D509 Iron deficiency anemia, unspecified: Secondary | ICD-10-CM | POA: Diagnosis not present

## 2024-01-14 DIAGNOSIS — Z1152 Encounter for screening for COVID-19: Secondary | ICD-10-CM | POA: Diagnosis not present

## 2024-01-14 DIAGNOSIS — A419 Sepsis, unspecified organism: Secondary | ICD-10-CM | POA: Diagnosis not present

## 2024-01-14 DIAGNOSIS — N281 Cyst of kidney, acquired: Secondary | ICD-10-CM | POA: Diagnosis not present

## 2024-01-14 DIAGNOSIS — Z931 Gastrostomy status: Secondary | ICD-10-CM | POA: Diagnosis not present

## 2024-01-14 DIAGNOSIS — Z9889 Other specified postprocedural states: Secondary | ICD-10-CM | POA: Diagnosis not present

## 2024-01-14 DIAGNOSIS — K56609 Unspecified intestinal obstruction, unspecified as to partial versus complete obstruction: Secondary | ICD-10-CM | POA: Diagnosis not present

## 2024-01-14 DIAGNOSIS — R519 Headache, unspecified: Secondary | ICD-10-CM | POA: Diagnosis not present

## 2024-01-14 DIAGNOSIS — R339 Retention of urine, unspecified: Secondary | ICD-10-CM | POA: Diagnosis not present

## 2024-01-14 DIAGNOSIS — R0682 Tachypnea, not elsewhere classified: Secondary | ICD-10-CM | POA: Diagnosis not present

## 2024-01-14 DIAGNOSIS — R079 Chest pain, unspecified: Secondary | ICD-10-CM | POA: Diagnosis not present

## 2024-01-14 DIAGNOSIS — R0989 Other specified symptoms and signs involving the circulatory and respiratory systems: Secondary | ICD-10-CM | POA: Diagnosis not present

## 2024-01-14 DIAGNOSIS — D75839 Thrombocytosis, unspecified: Secondary | ICD-10-CM | POA: Diagnosis not present

## 2024-01-14 DIAGNOSIS — J439 Emphysema, unspecified: Secondary | ICD-10-CM | POA: Diagnosis not present

## 2024-01-14 DIAGNOSIS — R1312 Dysphagia, oropharyngeal phase: Secondary | ICD-10-CM | POA: Diagnosis not present

## 2024-01-14 DIAGNOSIS — R509 Fever, unspecified: Secondary | ICD-10-CM | POA: Diagnosis not present

## 2024-01-14 DIAGNOSIS — B961 Klebsiella pneumoniae [K. pneumoniae] as the cause of diseases classified elsewhere: Secondary | ICD-10-CM | POA: Diagnosis not present

## 2024-01-14 DIAGNOSIS — Z1629 Resistance to other single specified antibiotic: Secondary | ICD-10-CM | POA: Diagnosis not present

## 2024-01-14 DIAGNOSIS — R059 Cough, unspecified: Secondary | ICD-10-CM | POA: Diagnosis not present

## 2024-01-14 DIAGNOSIS — J9622 Acute and chronic respiratory failure with hypercapnia: Secondary | ICD-10-CM | POA: Diagnosis not present

## 2024-01-14 DIAGNOSIS — J90 Pleural effusion, not elsewhere classified: Secondary | ICD-10-CM | POA: Diagnosis not present

## 2024-01-14 DIAGNOSIS — D72829 Elevated white blood cell count, unspecified: Secondary | ICD-10-CM | POA: Diagnosis not present

## 2024-01-14 DIAGNOSIS — J9621 Acute and chronic respiratory failure with hypoxia: Secondary | ICD-10-CM | POA: Diagnosis not present

## 2024-01-14 DIAGNOSIS — N170 Acute kidney failure with tubular necrosis: Secondary | ICD-10-CM | POA: Diagnosis not present

## 2024-01-15 DIAGNOSIS — J9601 Acute respiratory failure with hypoxia: Secondary | ICD-10-CM | POA: Diagnosis not present

## 2024-01-15 DIAGNOSIS — Z4682 Encounter for fitting and adjustment of non-vascular catheter: Secondary | ICD-10-CM | POA: Diagnosis not present

## 2024-01-15 DIAGNOSIS — R000 Tachycardia, unspecified: Secondary | ICD-10-CM | POA: Diagnosis not present

## 2024-01-15 DIAGNOSIS — E876 Hypokalemia: Secondary | ICD-10-CM | POA: Diagnosis not present

## 2024-01-15 DIAGNOSIS — J90 Pleural effusion, not elsewhere classified: Secondary | ICD-10-CM | POA: Diagnosis not present

## 2024-01-15 DIAGNOSIS — Z452 Encounter for adjustment and management of vascular access device: Secondary | ICD-10-CM | POA: Diagnosis not present

## 2024-01-15 DIAGNOSIS — R0902 Hypoxemia: Secondary | ICD-10-CM | POA: Diagnosis not present

## 2024-01-15 DIAGNOSIS — R918 Other nonspecific abnormal finding of lung field: Secondary | ICD-10-CM | POA: Diagnosis not present

## 2024-01-15 DIAGNOSIS — E870 Hyperosmolality and hypernatremia: Secondary | ICD-10-CM | POA: Diagnosis not present

## 2024-01-15 DIAGNOSIS — A419 Sepsis, unspecified organism: Secondary | ICD-10-CM | POA: Diagnosis not present

## 2024-01-16 DIAGNOSIS — J9601 Acute respiratory failure with hypoxia: Secondary | ICD-10-CM | POA: Diagnosis not present

## 2024-01-16 DIAGNOSIS — A419 Sepsis, unspecified organism: Secondary | ICD-10-CM | POA: Diagnosis not present

## 2024-01-16 DIAGNOSIS — J690 Pneumonitis due to inhalation of food and vomit: Secondary | ICD-10-CM | POA: Diagnosis not present

## 2024-01-16 DIAGNOSIS — R000 Tachycardia, unspecified: Secondary | ICD-10-CM | POA: Diagnosis not present

## 2024-01-17 DIAGNOSIS — R000 Tachycardia, unspecified: Secondary | ICD-10-CM | POA: Diagnosis not present

## 2024-01-17 DIAGNOSIS — E876 Hypokalemia: Secondary | ICD-10-CM | POA: Diagnosis not present

## 2024-01-17 DIAGNOSIS — J9601 Acute respiratory failure with hypoxia: Secondary | ICD-10-CM | POA: Diagnosis not present

## 2024-01-17 DIAGNOSIS — E870 Hyperosmolality and hypernatremia: Secondary | ICD-10-CM | POA: Diagnosis not present

## 2024-01-17 DIAGNOSIS — R1312 Dysphagia, oropharyngeal phase: Secondary | ICD-10-CM | POA: Diagnosis not present

## 2024-01-17 DIAGNOSIS — Z4682 Encounter for fitting and adjustment of non-vascular catheter: Secondary | ICD-10-CM | POA: Diagnosis not present

## 2024-01-17 DIAGNOSIS — R109 Unspecified abdominal pain: Secondary | ICD-10-CM | POA: Diagnosis not present

## 2024-01-18 DIAGNOSIS — R918 Other nonspecific abnormal finding of lung field: Secondary | ICD-10-CM | POA: Diagnosis not present

## 2024-01-18 DIAGNOSIS — R0902 Hypoxemia: Secondary | ICD-10-CM | POA: Diagnosis not present

## 2024-01-18 DIAGNOSIS — R7881 Bacteremia: Secondary | ICD-10-CM | POA: Diagnosis not present

## 2024-01-18 DIAGNOSIS — Z1629 Resistance to other single specified antibiotic: Secondary | ICD-10-CM | POA: Diagnosis not present

## 2024-01-18 DIAGNOSIS — A411 Sepsis due to other specified staphylococcus: Secondary | ICD-10-CM | POA: Diagnosis not present

## 2024-01-18 DIAGNOSIS — B957 Other staphylococcus as the cause of diseases classified elsewhere: Secondary | ICD-10-CM | POA: Diagnosis not present

## 2024-01-18 DIAGNOSIS — J690 Pneumonitis due to inhalation of food and vomit: Secondary | ICD-10-CM | POA: Diagnosis not present

## 2024-01-18 DIAGNOSIS — B964 Proteus (mirabilis) (morganii) as the cause of diseases classified elsewhere: Secondary | ICD-10-CM | POA: Diagnosis not present

## 2024-01-19 DIAGNOSIS — R7881 Bacteremia: Secondary | ICD-10-CM | POA: Diagnosis not present

## 2024-01-19 DIAGNOSIS — A411 Sepsis due to other specified staphylococcus: Secondary | ICD-10-CM | POA: Diagnosis not present

## 2024-01-19 DIAGNOSIS — Z0389 Encounter for observation for other suspected diseases and conditions ruled out: Secondary | ICD-10-CM | POA: Diagnosis not present

## 2024-01-19 DIAGNOSIS — J9601 Acute respiratory failure with hypoxia: Secondary | ICD-10-CM | POA: Diagnosis not present

## 2024-01-19 DIAGNOSIS — B957 Other staphylococcus as the cause of diseases classified elsewhere: Secondary | ICD-10-CM | POA: Diagnosis not present

## 2024-01-19 DIAGNOSIS — J690 Pneumonitis due to inhalation of food and vomit: Secondary | ICD-10-CM | POA: Diagnosis not present

## 2024-01-19 DIAGNOSIS — R918 Other nonspecific abnormal finding of lung field: Secondary | ICD-10-CM | POA: Diagnosis not present

## 2024-01-19 DIAGNOSIS — B964 Proteus (mirabilis) (morganii) as the cause of diseases classified elsewhere: Secondary | ICD-10-CM | POA: Diagnosis not present

## 2024-01-20 DIAGNOSIS — B9562 Methicillin resistant Staphylococcus aureus infection as the cause of diseases classified elsewhere: Secondary | ICD-10-CM | POA: Diagnosis not present

## 2024-01-20 DIAGNOSIS — R918 Other nonspecific abnormal finding of lung field: Secondary | ICD-10-CM | POA: Diagnosis not present

## 2024-01-20 DIAGNOSIS — Z452 Encounter for adjustment and management of vascular access device: Secondary | ICD-10-CM | POA: Diagnosis not present

## 2024-01-20 DIAGNOSIS — Z4682 Encounter for fitting and adjustment of non-vascular catheter: Secondary | ICD-10-CM | POA: Diagnosis not present

## 2024-01-20 DIAGNOSIS — Z1629 Resistance to other single specified antibiotic: Secondary | ICD-10-CM | POA: Diagnosis not present

## 2024-01-20 DIAGNOSIS — J690 Pneumonitis due to inhalation of food and vomit: Secondary | ICD-10-CM | POA: Diagnosis not present

## 2024-01-20 DIAGNOSIS — B957 Other staphylococcus as the cause of diseases classified elsewhere: Secondary | ICD-10-CM | POA: Diagnosis not present

## 2024-01-20 DIAGNOSIS — R7881 Bacteremia: Secondary | ICD-10-CM | POA: Diagnosis not present

## 2024-01-20 DIAGNOSIS — I340 Nonrheumatic mitral (valve) insufficiency: Secondary | ICD-10-CM | POA: Diagnosis not present

## 2024-01-20 DIAGNOSIS — B964 Proteus (mirabilis) (morganii) as the cause of diseases classified elsewhere: Secondary | ICD-10-CM | POA: Diagnosis not present

## 2024-01-21 DIAGNOSIS — R000 Tachycardia, unspecified: Secondary | ICD-10-CM | POA: Diagnosis not present

## 2024-01-21 DIAGNOSIS — J690 Pneumonitis due to inhalation of food and vomit: Secondary | ICD-10-CM | POA: Diagnosis not present

## 2024-01-21 DIAGNOSIS — J984 Other disorders of lung: Secondary | ICD-10-CM | POA: Diagnosis not present

## 2024-01-21 DIAGNOSIS — R0682 Tachypnea, not elsewhere classified: Secondary | ICD-10-CM | POA: Diagnosis not present

## 2024-01-21 DIAGNOSIS — J9601 Acute respiratory failure with hypoxia: Secondary | ICD-10-CM | POA: Diagnosis not present

## 2024-01-21 DIAGNOSIS — J90 Pleural effusion, not elsewhere classified: Secondary | ICD-10-CM | POA: Diagnosis not present

## 2024-01-21 DIAGNOSIS — B964 Proteus (mirabilis) (morganii) as the cause of diseases classified elsewhere: Secondary | ICD-10-CM | POA: Diagnosis not present

## 2024-01-21 DIAGNOSIS — R7881 Bacteremia: Secondary | ICD-10-CM | POA: Diagnosis not present

## 2024-01-21 DIAGNOSIS — I10 Essential (primary) hypertension: Secondary | ICD-10-CM | POA: Diagnosis not present

## 2024-01-21 DIAGNOSIS — R918 Other nonspecific abnormal finding of lung field: Secondary | ICD-10-CM | POA: Diagnosis not present

## 2024-01-21 DIAGNOSIS — G801 Spastic diplegic cerebral palsy: Secondary | ICD-10-CM | POA: Diagnosis not present

## 2024-01-21 DIAGNOSIS — Z982 Presence of cerebrospinal fluid drainage device: Secondary | ICD-10-CM | POA: Diagnosis not present

## 2024-01-21 DIAGNOSIS — B9562 Methicillin resistant Staphylococcus aureus infection as the cause of diseases classified elsewhere: Secondary | ICD-10-CM | POA: Diagnosis not present

## 2024-01-22 DIAGNOSIS — R7881 Bacteremia: Secondary | ICD-10-CM | POA: Diagnosis not present

## 2024-01-22 DIAGNOSIS — Z981 Arthrodesis status: Secondary | ICD-10-CM | POA: Diagnosis not present

## 2024-01-22 DIAGNOSIS — G9389 Other specified disorders of brain: Secondary | ICD-10-CM | POA: Diagnosis not present

## 2024-01-22 DIAGNOSIS — B957 Other staphylococcus as the cause of diseases classified elsewhere: Secondary | ICD-10-CM | POA: Diagnosis not present

## 2024-01-22 DIAGNOSIS — Z452 Encounter for adjustment and management of vascular access device: Secondary | ICD-10-CM | POA: Diagnosis not present

## 2024-01-22 DIAGNOSIS — R918 Other nonspecific abnormal finding of lung field: Secondary | ICD-10-CM | POA: Diagnosis not present

## 2024-01-22 DIAGNOSIS — Z1629 Resistance to other single specified antibiotic: Secondary | ICD-10-CM | POA: Diagnosis not present

## 2024-01-22 DIAGNOSIS — R4182 Altered mental status, unspecified: Secondary | ICD-10-CM | POA: Diagnosis not present

## 2024-01-22 DIAGNOSIS — J984 Other disorders of lung: Secondary | ICD-10-CM | POA: Diagnosis not present

## 2024-01-22 DIAGNOSIS — J690 Pneumonitis due to inhalation of food and vomit: Secondary | ICD-10-CM | POA: Diagnosis not present

## 2024-01-22 DIAGNOSIS — Z4682 Encounter for fitting and adjustment of non-vascular catheter: Secondary | ICD-10-CM | POA: Diagnosis not present

## 2024-01-22 DIAGNOSIS — B964 Proteus (mirabilis) (morganii) as the cause of diseases classified elsewhere: Secondary | ICD-10-CM | POA: Diagnosis not present

## 2024-01-22 DIAGNOSIS — J90 Pleural effusion, not elsewhere classified: Secondary | ICD-10-CM | POA: Diagnosis not present

## 2024-01-23 DIAGNOSIS — J9602 Acute respiratory failure with hypercapnia: Secondary | ICD-10-CM | POA: Diagnosis not present

## 2024-01-23 DIAGNOSIS — Z452 Encounter for adjustment and management of vascular access device: Secondary | ICD-10-CM | POA: Diagnosis not present

## 2024-01-23 DIAGNOSIS — J690 Pneumonitis due to inhalation of food and vomit: Secondary | ICD-10-CM | POA: Diagnosis not present

## 2024-01-23 DIAGNOSIS — R7881 Bacteremia: Secondary | ICD-10-CM | POA: Diagnosis not present

## 2024-01-23 DIAGNOSIS — J9601 Acute respiratory failure with hypoxia: Secondary | ICD-10-CM | POA: Diagnosis not present

## 2024-01-23 DIAGNOSIS — B9562 Methicillin resistant Staphylococcus aureus infection as the cause of diseases classified elsewhere: Secondary | ICD-10-CM | POA: Diagnosis not present

## 2024-01-23 DIAGNOSIS — Z4682 Encounter for fitting and adjustment of non-vascular catheter: Secondary | ICD-10-CM | POA: Diagnosis not present

## 2024-01-23 DIAGNOSIS — J90 Pleural effusion, not elsewhere classified: Secondary | ICD-10-CM | POA: Diagnosis not present

## 2024-01-24 DIAGNOSIS — J90 Pleural effusion, not elsewhere classified: Secondary | ICD-10-CM | POA: Diagnosis not present

## 2024-01-24 DIAGNOSIS — J690 Pneumonitis due to inhalation of food and vomit: Secondary | ICD-10-CM | POA: Diagnosis not present

## 2024-01-24 DIAGNOSIS — Z982 Presence of cerebrospinal fluid drainage device: Secondary | ICD-10-CM | POA: Diagnosis not present

## 2024-01-24 DIAGNOSIS — J9621 Acute and chronic respiratory failure with hypoxia: Secondary | ICD-10-CM | POA: Diagnosis not present

## 2024-01-24 DIAGNOSIS — J9622 Acute and chronic respiratory failure with hypercapnia: Secondary | ICD-10-CM | POA: Diagnosis not present

## 2024-01-25 DIAGNOSIS — J9811 Atelectasis: Secondary | ICD-10-CM | POA: Diagnosis not present

## 2024-01-25 DIAGNOSIS — J9602 Acute respiratory failure with hypercapnia: Secondary | ICD-10-CM | POA: Diagnosis not present

## 2024-01-25 DIAGNOSIS — J984 Other disorders of lung: Secondary | ICD-10-CM | POA: Diagnosis not present

## 2024-01-25 DIAGNOSIS — R509 Fever, unspecified: Secondary | ICD-10-CM | POA: Diagnosis not present

## 2024-01-25 DIAGNOSIS — A4902 Methicillin resistant Staphylococcus aureus infection, unspecified site: Secondary | ICD-10-CM | POA: Diagnosis not present

## 2024-01-25 DIAGNOSIS — J690 Pneumonitis due to inhalation of food and vomit: Secondary | ICD-10-CM | POA: Diagnosis not present

## 2024-01-25 DIAGNOSIS — N179 Acute kidney failure, unspecified: Secondary | ICD-10-CM | POA: Diagnosis not present

## 2024-01-25 DIAGNOSIS — J9601 Acute respiratory failure with hypoxia: Secondary | ICD-10-CM | POA: Diagnosis not present

## 2024-01-25 DIAGNOSIS — R0902 Hypoxemia: Secondary | ICD-10-CM | POA: Diagnosis not present

## 2024-01-25 DIAGNOSIS — D72829 Elevated white blood cell count, unspecified: Secondary | ICD-10-CM | POA: Diagnosis not present

## 2024-01-26 DIAGNOSIS — N281 Cyst of kidney, acquired: Secondary | ICD-10-CM | POA: Diagnosis not present

## 2024-01-26 DIAGNOSIS — R7881 Bacteremia: Secondary | ICD-10-CM | POA: Diagnosis not present

## 2024-01-26 DIAGNOSIS — B9562 Methicillin resistant Staphylococcus aureus infection as the cause of diseases classified elsewhere: Secondary | ICD-10-CM | POA: Diagnosis not present

## 2024-01-26 DIAGNOSIS — D72829 Elevated white blood cell count, unspecified: Secondary | ICD-10-CM | POA: Diagnosis not present

## 2024-01-26 DIAGNOSIS — R509 Fever, unspecified: Secondary | ICD-10-CM | POA: Diagnosis not present

## 2024-01-26 DIAGNOSIS — Z9049 Acquired absence of other specified parts of digestive tract: Secondary | ICD-10-CM | POA: Diagnosis not present

## 2024-01-26 DIAGNOSIS — J690 Pneumonitis due to inhalation of food and vomit: Secondary | ICD-10-CM | POA: Diagnosis not present

## 2024-01-26 DIAGNOSIS — N179 Acute kidney failure, unspecified: Secondary | ICD-10-CM | POA: Diagnosis not present

## 2024-01-27 DIAGNOSIS — R519 Headache, unspecified: Secondary | ICD-10-CM | POA: Diagnosis not present

## 2024-01-27 DIAGNOSIS — B9562 Methicillin resistant Staphylococcus aureus infection as the cause of diseases classified elsewhere: Secondary | ICD-10-CM | POA: Diagnosis not present

## 2024-01-27 DIAGNOSIS — J690 Pneumonitis due to inhalation of food and vomit: Secondary | ICD-10-CM | POA: Diagnosis not present

## 2024-01-27 DIAGNOSIS — R7881 Bacteremia: Secondary | ICD-10-CM | POA: Diagnosis not present

## 2024-01-27 DIAGNOSIS — J9601 Acute respiratory failure with hypoxia: Secondary | ICD-10-CM | POA: Diagnosis not present

## 2024-01-27 DIAGNOSIS — J9602 Acute respiratory failure with hypercapnia: Secondary | ICD-10-CM | POA: Diagnosis not present

## 2024-01-27 DIAGNOSIS — N179 Acute kidney failure, unspecified: Secondary | ICD-10-CM | POA: Diagnosis not present

## 2024-01-27 DIAGNOSIS — J90 Pleural effusion, not elsewhere classified: Secondary | ICD-10-CM | POA: Diagnosis not present

## 2024-01-27 DIAGNOSIS — B957 Other staphylococcus as the cause of diseases classified elsewhere: Secondary | ICD-10-CM | POA: Diagnosis not present

## 2024-01-28 DIAGNOSIS — J690 Pneumonitis due to inhalation of food and vomit: Secondary | ICD-10-CM | POA: Diagnosis not present

## 2024-01-28 DIAGNOSIS — Z9889 Other specified postprocedural states: Secondary | ICD-10-CM | POA: Diagnosis not present

## 2024-01-28 DIAGNOSIS — E870 Hyperosmolality and hypernatremia: Secondary | ICD-10-CM | POA: Diagnosis not present

## 2024-01-28 DIAGNOSIS — J9601 Acute respiratory failure with hypoxia: Secondary | ICD-10-CM | POA: Diagnosis not present

## 2024-01-28 DIAGNOSIS — R7881 Bacteremia: Secondary | ICD-10-CM | POA: Diagnosis not present

## 2024-01-28 DIAGNOSIS — B9562 Methicillin resistant Staphylococcus aureus infection as the cause of diseases classified elsewhere: Secondary | ICD-10-CM | POA: Diagnosis not present

## 2024-01-28 DIAGNOSIS — J9602 Acute respiratory failure with hypercapnia: Secondary | ICD-10-CM | POA: Diagnosis not present

## 2024-01-28 DIAGNOSIS — N179 Acute kidney failure, unspecified: Secondary | ICD-10-CM | POA: Diagnosis not present

## 2024-01-29 DIAGNOSIS — R000 Tachycardia, unspecified: Secondary | ICD-10-CM | POA: Diagnosis not present

## 2024-01-29 DIAGNOSIS — N179 Acute kidney failure, unspecified: Secondary | ICD-10-CM | POA: Diagnosis not present

## 2024-01-29 DIAGNOSIS — J9602 Acute respiratory failure with hypercapnia: Secondary | ICD-10-CM | POA: Diagnosis not present

## 2024-01-29 DIAGNOSIS — J690 Pneumonitis due to inhalation of food and vomit: Secondary | ICD-10-CM | POA: Diagnosis not present

## 2024-01-29 DIAGNOSIS — J9601 Acute respiratory failure with hypoxia: Secondary | ICD-10-CM | POA: Diagnosis not present

## 2024-01-29 DIAGNOSIS — G9340 Encephalopathy, unspecified: Secondary | ICD-10-CM | POA: Diagnosis not present

## 2024-01-30 ENCOUNTER — Ambulatory Visit: Admitting: Family Medicine

## 2024-01-30 DIAGNOSIS — A419 Sepsis, unspecified organism: Secondary | ICD-10-CM | POA: Diagnosis not present

## 2024-01-30 DIAGNOSIS — J9601 Acute respiratory failure with hypoxia: Secondary | ICD-10-CM | POA: Diagnosis not present

## 2024-01-30 DIAGNOSIS — G9340 Encephalopathy, unspecified: Secondary | ICD-10-CM | POA: Diagnosis not present

## 2024-01-30 DIAGNOSIS — D509 Iron deficiency anemia, unspecified: Secondary | ICD-10-CM | POA: Diagnosis not present

## 2024-01-30 DIAGNOSIS — J690 Pneumonitis due to inhalation of food and vomit: Secondary | ICD-10-CM | POA: Diagnosis not present

## 2024-01-30 DIAGNOSIS — J9602 Acute respiratory failure with hypercapnia: Secondary | ICD-10-CM | POA: Diagnosis not present

## 2024-01-30 DIAGNOSIS — N179 Acute kidney failure, unspecified: Secondary | ICD-10-CM | POA: Diagnosis not present

## 2024-01-31 DIAGNOSIS — G9340 Encephalopathy, unspecified: Secondary | ICD-10-CM | POA: Diagnosis not present

## 2024-01-31 DIAGNOSIS — J9601 Acute respiratory failure with hypoxia: Secondary | ICD-10-CM | POA: Diagnosis not present

## 2024-01-31 DIAGNOSIS — D509 Iron deficiency anemia, unspecified: Secondary | ICD-10-CM | POA: Diagnosis not present

## 2024-01-31 DIAGNOSIS — J690 Pneumonitis due to inhalation of food and vomit: Secondary | ICD-10-CM | POA: Diagnosis not present

## 2024-01-31 DIAGNOSIS — A419 Sepsis, unspecified organism: Secondary | ICD-10-CM | POA: Diagnosis not present

## 2024-01-31 DIAGNOSIS — J9602 Acute respiratory failure with hypercapnia: Secondary | ICD-10-CM | POA: Diagnosis not present

## 2024-01-31 DIAGNOSIS — N179 Acute kidney failure, unspecified: Secondary | ICD-10-CM | POA: Diagnosis not present

## 2024-01-31 DIAGNOSIS — R0602 Shortness of breath: Secondary | ICD-10-CM | POA: Diagnosis not present

## 2024-02-01 DIAGNOSIS — N179 Acute kidney failure, unspecified: Secondary | ICD-10-CM | POA: Diagnosis not present

## 2024-02-01 DIAGNOSIS — J690 Pneumonitis due to inhalation of food and vomit: Secondary | ICD-10-CM | POA: Diagnosis not present

## 2024-02-01 DIAGNOSIS — G9340 Encephalopathy, unspecified: Secondary | ICD-10-CM | POA: Diagnosis not present

## 2024-02-01 DIAGNOSIS — J9602 Acute respiratory failure with hypercapnia: Secondary | ICD-10-CM | POA: Diagnosis not present

## 2024-02-01 DIAGNOSIS — D509 Iron deficiency anemia, unspecified: Secondary | ICD-10-CM | POA: Diagnosis not present

## 2024-02-01 DIAGNOSIS — J9601 Acute respiratory failure with hypoxia: Secondary | ICD-10-CM | POA: Diagnosis not present

## 2024-02-02 DIAGNOSIS — G9340 Encephalopathy, unspecified: Secondary | ICD-10-CM | POA: Diagnosis not present

## 2024-02-02 DIAGNOSIS — N179 Acute kidney failure, unspecified: Secondary | ICD-10-CM | POA: Diagnosis not present

## 2024-02-02 DIAGNOSIS — J690 Pneumonitis due to inhalation of food and vomit: Secondary | ICD-10-CM | POA: Diagnosis not present

## 2024-02-02 DIAGNOSIS — J9601 Acute respiratory failure with hypoxia: Secondary | ICD-10-CM | POA: Diagnosis not present

## 2024-02-02 DIAGNOSIS — J9602 Acute respiratory failure with hypercapnia: Secondary | ICD-10-CM | POA: Diagnosis not present

## 2024-02-02 DIAGNOSIS — D509 Iron deficiency anemia, unspecified: Secondary | ICD-10-CM | POA: Diagnosis not present

## 2024-02-03 DIAGNOSIS — G9340 Encephalopathy, unspecified: Secondary | ICD-10-CM | POA: Diagnosis not present

## 2024-02-03 DIAGNOSIS — D509 Iron deficiency anemia, unspecified: Secondary | ICD-10-CM | POA: Diagnosis not present

## 2024-02-03 DIAGNOSIS — J9602 Acute respiratory failure with hypercapnia: Secondary | ICD-10-CM | POA: Diagnosis not present

## 2024-02-03 DIAGNOSIS — E870 Hyperosmolality and hypernatremia: Secondary | ICD-10-CM | POA: Diagnosis not present

## 2024-02-03 DIAGNOSIS — N179 Acute kidney failure, unspecified: Secondary | ICD-10-CM | POA: Diagnosis not present

## 2024-02-03 DIAGNOSIS — G809 Cerebral palsy, unspecified: Secondary | ICD-10-CM | POA: Diagnosis not present

## 2024-02-03 DIAGNOSIS — J9601 Acute respiratory failure with hypoxia: Secondary | ICD-10-CM | POA: Diagnosis not present

## 2024-02-04 DIAGNOSIS — E870 Hyperosmolality and hypernatremia: Secondary | ICD-10-CM | POA: Diagnosis not present

## 2024-02-04 DIAGNOSIS — N179 Acute kidney failure, unspecified: Secondary | ICD-10-CM | POA: Diagnosis not present

## 2024-02-04 DIAGNOSIS — G9340 Encephalopathy, unspecified: Secondary | ICD-10-CM | POA: Diagnosis not present

## 2024-02-04 DIAGNOSIS — R1312 Dysphagia, oropharyngeal phase: Secondary | ICD-10-CM | POA: Diagnosis not present

## 2024-02-04 DIAGNOSIS — D509 Iron deficiency anemia, unspecified: Secondary | ICD-10-CM | POA: Diagnosis not present

## 2024-02-04 DIAGNOSIS — G809 Cerebral palsy, unspecified: Secondary | ICD-10-CM | POA: Diagnosis not present

## 2024-02-05 DIAGNOSIS — E870 Hyperosmolality and hypernatremia: Secondary | ICD-10-CM | POA: Diagnosis not present

## 2024-02-05 DIAGNOSIS — J9602 Acute respiratory failure with hypercapnia: Secondary | ICD-10-CM | POA: Diagnosis not present

## 2024-02-05 DIAGNOSIS — J9601 Acute respiratory failure with hypoxia: Secondary | ICD-10-CM | POA: Diagnosis not present

## 2024-02-05 DIAGNOSIS — N179 Acute kidney failure, unspecified: Secondary | ICD-10-CM | POA: Diagnosis not present

## 2024-02-06 DIAGNOSIS — E870 Hyperosmolality and hypernatremia: Secondary | ICD-10-CM | POA: Diagnosis not present

## 2024-02-06 DIAGNOSIS — J9601 Acute respiratory failure with hypoxia: Secondary | ICD-10-CM | POA: Diagnosis not present

## 2024-02-06 DIAGNOSIS — N179 Acute kidney failure, unspecified: Secondary | ICD-10-CM | POA: Diagnosis not present

## 2024-02-06 DIAGNOSIS — J9602 Acute respiratory failure with hypercapnia: Secondary | ICD-10-CM | POA: Diagnosis not present

## 2024-02-06 DIAGNOSIS — G809 Cerebral palsy, unspecified: Secondary | ICD-10-CM | POA: Diagnosis not present

## 2024-02-07 DIAGNOSIS — N400 Enlarged prostate without lower urinary tract symptoms: Secondary | ICD-10-CM | POA: Diagnosis not present

## 2024-02-07 DIAGNOSIS — Z982 Presence of cerebrospinal fluid drainage device: Secondary | ICD-10-CM | POA: Diagnosis not present

## 2024-02-07 DIAGNOSIS — Z515 Encounter for palliative care: Secondary | ICD-10-CM | POA: Diagnosis not present

## 2024-02-07 DIAGNOSIS — G809 Cerebral palsy, unspecified: Secondary | ICD-10-CM | POA: Diagnosis not present

## 2024-02-07 DIAGNOSIS — J9601 Acute respiratory failure with hypoxia: Secondary | ICD-10-CM | POA: Diagnosis not present

## 2024-02-07 DIAGNOSIS — D509 Iron deficiency anemia, unspecified: Secondary | ICD-10-CM | POA: Diagnosis not present

## 2024-02-08 DIAGNOSIS — D509 Iron deficiency anemia, unspecified: Secondary | ICD-10-CM | POA: Diagnosis not present

## 2024-02-08 DIAGNOSIS — N400 Enlarged prostate without lower urinary tract symptoms: Secondary | ICD-10-CM | POA: Diagnosis not present

## 2024-02-08 DIAGNOSIS — R918 Other nonspecific abnormal finding of lung field: Secondary | ICD-10-CM | POA: Diagnosis not present

## 2024-02-08 DIAGNOSIS — R059 Cough, unspecified: Secondary | ICD-10-CM | POA: Diagnosis not present

## 2024-02-08 DIAGNOSIS — R0989 Other specified symptoms and signs involving the circulatory and respiratory systems: Secondary | ICD-10-CM | POA: Diagnosis not present

## 2024-02-08 DIAGNOSIS — Z982 Presence of cerebrospinal fluid drainage device: Secondary | ICD-10-CM | POA: Diagnosis not present

## 2024-02-08 DIAGNOSIS — G9389 Other specified disorders of brain: Secondary | ICD-10-CM | POA: Diagnosis not present

## 2024-02-08 DIAGNOSIS — G809 Cerebral palsy, unspecified: Secondary | ICD-10-CM | POA: Diagnosis not present

## 2024-02-09 DIAGNOSIS — Z982 Presence of cerebrospinal fluid drainage device: Secondary | ICD-10-CM | POA: Diagnosis not present

## 2024-02-09 DIAGNOSIS — N400 Enlarged prostate without lower urinary tract symptoms: Secondary | ICD-10-CM | POA: Diagnosis not present

## 2024-02-09 DIAGNOSIS — D509 Iron deficiency anemia, unspecified: Secondary | ICD-10-CM | POA: Diagnosis not present

## 2024-02-09 DIAGNOSIS — G809 Cerebral palsy, unspecified: Secondary | ICD-10-CM | POA: Diagnosis not present

## 2024-02-10 DIAGNOSIS — R7881 Bacteremia: Secondary | ICD-10-CM | POA: Diagnosis not present

## 2024-02-10 DIAGNOSIS — B961 Klebsiella pneumoniae [K. pneumoniae] as the cause of diseases classified elsewhere: Secondary | ICD-10-CM | POA: Diagnosis not present

## 2024-02-10 DIAGNOSIS — J690 Pneumonitis due to inhalation of food and vomit: Secondary | ICD-10-CM | POA: Diagnosis not present

## 2024-02-10 DIAGNOSIS — G809 Cerebral palsy, unspecified: Secondary | ICD-10-CM | POA: Diagnosis not present

## 2024-02-10 DIAGNOSIS — Z515 Encounter for palliative care: Secondary | ICD-10-CM | POA: Diagnosis not present

## 2024-02-10 DIAGNOSIS — M79606 Pain in leg, unspecified: Secondary | ICD-10-CM | POA: Diagnosis not present

## 2024-02-10 DIAGNOSIS — Z982 Presence of cerebrospinal fluid drainage device: Secondary | ICD-10-CM | POA: Diagnosis not present

## 2024-02-10 DIAGNOSIS — J9601 Acute respiratory failure with hypoxia: Secondary | ICD-10-CM | POA: Diagnosis not present

## 2024-02-10 DIAGNOSIS — B957 Other staphylococcus as the cause of diseases classified elsewhere: Secondary | ICD-10-CM | POA: Diagnosis not present

## 2024-02-11 DIAGNOSIS — G809 Cerebral palsy, unspecified: Secondary | ICD-10-CM | POA: Diagnosis not present

## 2024-02-11 DIAGNOSIS — Z515 Encounter for palliative care: Secondary | ICD-10-CM | POA: Diagnosis not present

## 2024-02-11 DIAGNOSIS — Z982 Presence of cerebrospinal fluid drainage device: Secondary | ICD-10-CM | POA: Diagnosis not present

## 2024-02-11 DIAGNOSIS — B961 Klebsiella pneumoniae [K. pneumoniae] as the cause of diseases classified elsewhere: Secondary | ICD-10-CM | POA: Diagnosis not present

## 2024-02-11 DIAGNOSIS — R7881 Bacteremia: Secondary | ICD-10-CM | POA: Diagnosis not present

## 2024-02-11 DIAGNOSIS — B957 Other staphylococcus as the cause of diseases classified elsewhere: Secondary | ICD-10-CM | POA: Diagnosis not present

## 2024-02-12 DIAGNOSIS — R7881 Bacteremia: Secondary | ICD-10-CM | POA: Diagnosis not present

## 2024-02-12 DIAGNOSIS — B961 Klebsiella pneumoniae [K. pneumoniae] as the cause of diseases classified elsewhere: Secondary | ICD-10-CM | POA: Diagnosis not present

## 2024-02-12 DIAGNOSIS — Z982 Presence of cerebrospinal fluid drainage device: Secondary | ICD-10-CM | POA: Diagnosis not present

## 2024-02-12 DIAGNOSIS — B957 Other staphylococcus as the cause of diseases classified elsewhere: Secondary | ICD-10-CM | POA: Diagnosis not present

## 2024-02-12 DIAGNOSIS — G809 Cerebral palsy, unspecified: Secondary | ICD-10-CM | POA: Diagnosis not present

## 2024-02-14 ENCOUNTER — Telehealth: Payer: Self-pay

## 2024-02-14 NOTE — Telephone Encounter (Signed)
 Copied from CRM [id number]. Topic: Clinical - Order For Equipment >> Feb 14, 2024  8:11 AM Baldomero Bone wrote: Reason for CRM: Patient is trying to purchase a wheelchair. The vendor, [employer], does not sell to the public. The patient needs a prescription. Callback number is [phone]

## 2024-02-17 NOTE — Telephone Encounter (Signed)
 Spoke with Tiburcio Fly (father) who states patient is currently in the hospital, however they have purchased a wheelchair via another company (not Publishing copy) with hopes it will be delivered this week. Aware that should they still continue to pursue a wheelchair through [employer] or another company who requires a rx that patient will need an appointment. Wes verbalized understanding.

## 2024-03-01 DIAGNOSIS — J690 Pneumonitis due to inhalation of food and vomit: Secondary | ICD-10-CM | POA: Diagnosis not present

## 2024-03-01 DIAGNOSIS — E43 Unspecified severe protein-calorie malnutrition: Secondary | ICD-10-CM | POA: Diagnosis not present

## 2024-03-01 DIAGNOSIS — G9341 Metabolic encephalopathy: Secondary | ICD-10-CM | POA: Diagnosis not present

## 2024-03-01 DIAGNOSIS — B964 Proteus (mirabilis) (morganii) as the cause of diseases classified elsewhere: Secondary | ICD-10-CM | POA: Diagnosis not present

## 2024-03-01 DIAGNOSIS — K56609 Unspecified intestinal obstruction, unspecified as to partial versus complete obstruction: Secondary | ICD-10-CM | POA: Diagnosis not present

## 2024-03-01 DIAGNOSIS — K592 Neurogenic bowel, not elsewhere classified: Secondary | ICD-10-CM | POA: Diagnosis not present

## 2024-03-01 DIAGNOSIS — B9562 Methicillin resistant Staphylococcus aureus infection as the cause of diseases classified elsewhere: Secondary | ICD-10-CM | POA: Diagnosis not present

## 2024-03-01 DIAGNOSIS — A419 Sepsis, unspecified organism: Secondary | ICD-10-CM | POA: Diagnosis not present

## 2024-03-01 DIAGNOSIS — N10 Acute tubulo-interstitial nephritis: Secondary | ICD-10-CM | POA: Diagnosis not present

## 2024-03-01 DIAGNOSIS — Z982 Presence of cerebrospinal fluid drainage device: Secondary | ICD-10-CM | POA: Diagnosis not present

## 2024-03-01 DIAGNOSIS — B961 Klebsiella pneumoniae [K. pneumoniae] as the cause of diseases classified elsewhere: Secondary | ICD-10-CM | POA: Diagnosis not present

## 2024-03-01 DIAGNOSIS — G809 Cerebral palsy, unspecified: Secondary | ICD-10-CM | POA: Diagnosis not present

## 2024-03-01 DIAGNOSIS — R7881 Bacteremia: Secondary | ICD-10-CM | POA: Diagnosis not present

## 2024-03-02 DIAGNOSIS — B9562 Methicillin resistant Staphylococcus aureus infection as the cause of diseases classified elsewhere: Secondary | ICD-10-CM | POA: Diagnosis not present

## 2024-03-02 DIAGNOSIS — R7881 Bacteremia: Secondary | ICD-10-CM | POA: Diagnosis not present

## 2024-03-02 DIAGNOSIS — G809 Cerebral palsy, unspecified: Secondary | ICD-10-CM | POA: Diagnosis not present

## 2024-03-02 DIAGNOSIS — K56609 Unspecified intestinal obstruction, unspecified as to partial versus complete obstruction: Secondary | ICD-10-CM | POA: Diagnosis not present

## 2024-03-02 DIAGNOSIS — B961 Klebsiella pneumoniae [K. pneumoniae] as the cause of diseases classified elsewhere: Secondary | ICD-10-CM | POA: Diagnosis not present

## 2024-03-02 DIAGNOSIS — J690 Pneumonitis due to inhalation of food and vomit: Secondary | ICD-10-CM | POA: Diagnosis not present

## 2024-03-03 DIAGNOSIS — G809 Cerebral palsy, unspecified: Secondary | ICD-10-CM | POA: Diagnosis not present

## 2024-03-03 DIAGNOSIS — B961 Klebsiella pneumoniae [K. pneumoniae] as the cause of diseases classified elsewhere: Secondary | ICD-10-CM | POA: Diagnosis not present

## 2024-03-03 DIAGNOSIS — B9562 Methicillin resistant Staphylococcus aureus infection as the cause of diseases classified elsewhere: Secondary | ICD-10-CM | POA: Diagnosis not present

## 2024-03-03 DIAGNOSIS — K56609 Unspecified intestinal obstruction, unspecified as to partial versus complete obstruction: Secondary | ICD-10-CM | POA: Diagnosis not present

## 2024-03-03 DIAGNOSIS — J690 Pneumonitis due to inhalation of food and vomit: Secondary | ICD-10-CM | POA: Diagnosis not present

## 2024-03-03 DIAGNOSIS — R7881 Bacteremia: Secondary | ICD-10-CM | POA: Diagnosis not present

## 2024-03-04 DIAGNOSIS — B961 Klebsiella pneumoniae [K. pneumoniae] as the cause of diseases classified elsewhere: Secondary | ICD-10-CM | POA: Diagnosis not present

## 2024-03-04 DIAGNOSIS — G809 Cerebral palsy, unspecified: Secondary | ICD-10-CM | POA: Diagnosis not present

## 2024-03-04 DIAGNOSIS — R7881 Bacteremia: Secondary | ICD-10-CM | POA: Diagnosis not present

## 2024-03-04 DIAGNOSIS — J690 Pneumonitis due to inhalation of food and vomit: Secondary | ICD-10-CM | POA: Diagnosis not present

## 2024-03-04 DIAGNOSIS — K56609 Unspecified intestinal obstruction, unspecified as to partial versus complete obstruction: Secondary | ICD-10-CM | POA: Diagnosis not present

## 2024-03-04 DIAGNOSIS — B9562 Methicillin resistant Staphylococcus aureus infection as the cause of diseases classified elsewhere: Secondary | ICD-10-CM | POA: Diagnosis not present

## 2024-03-05 DIAGNOSIS — R7881 Bacteremia: Secondary | ICD-10-CM | POA: Diagnosis not present

## 2024-03-05 DIAGNOSIS — J690 Pneumonitis due to inhalation of food and vomit: Secondary | ICD-10-CM | POA: Diagnosis not present

## 2024-03-05 DIAGNOSIS — K56609 Unspecified intestinal obstruction, unspecified as to partial versus complete obstruction: Secondary | ICD-10-CM | POA: Diagnosis not present

## 2024-03-05 DIAGNOSIS — B9562 Methicillin resistant Staphylococcus aureus infection as the cause of diseases classified elsewhere: Secondary | ICD-10-CM | POA: Diagnosis not present

## 2024-03-05 DIAGNOSIS — B961 Klebsiella pneumoniae [K. pneumoniae] as the cause of diseases classified elsewhere: Secondary | ICD-10-CM | POA: Diagnosis not present

## 2024-03-05 DIAGNOSIS — G809 Cerebral palsy, unspecified: Secondary | ICD-10-CM | POA: Diagnosis not present

## 2024-03-06 DIAGNOSIS — J690 Pneumonitis due to inhalation of food and vomit: Secondary | ICD-10-CM | POA: Diagnosis not present

## 2024-03-06 DIAGNOSIS — G809 Cerebral palsy, unspecified: Secondary | ICD-10-CM | POA: Diagnosis not present

## 2024-03-06 DIAGNOSIS — R7881 Bacteremia: Secondary | ICD-10-CM | POA: Diagnosis not present

## 2024-03-06 DIAGNOSIS — B961 Klebsiella pneumoniae [K. pneumoniae] as the cause of diseases classified elsewhere: Secondary | ICD-10-CM | POA: Diagnosis not present

## 2024-03-06 DIAGNOSIS — K56609 Unspecified intestinal obstruction, unspecified as to partial versus complete obstruction: Secondary | ICD-10-CM | POA: Diagnosis not present

## 2024-03-06 DIAGNOSIS — B9562 Methicillin resistant Staphylococcus aureus infection as the cause of diseases classified elsewhere: Secondary | ICD-10-CM | POA: Diagnosis not present
